# Patient Record
Sex: Female | Born: 1945 | Race: White | Hispanic: No | Marital: Married | Smoking: Never smoker
Health system: Southern US, Community
[De-identification: ages and names within clinical notes are randomized; demographics above are authoritative.]

## PROBLEM LIST (undated history)

## (undated) DIAGNOSIS — K219 Gastro-esophageal reflux disease without esophagitis: Secondary | ICD-10-CM

## (undated) DIAGNOSIS — Z860100 Personal history of colon polyps, unspecified: Secondary | ICD-10-CM

## (undated) DIAGNOSIS — Q249 Congenital malformation of heart, unspecified: Secondary | ICD-10-CM

## (undated) DIAGNOSIS — M1990 Unspecified osteoarthritis, unspecified site: Secondary | ICD-10-CM

## (undated) DIAGNOSIS — M7500 Adhesive capsulitis of unspecified shoulder: Secondary | ICD-10-CM

## (undated) DIAGNOSIS — E785 Hyperlipidemia, unspecified: Secondary | ICD-10-CM

## (undated) DIAGNOSIS — I10 Essential (primary) hypertension: Secondary | ICD-10-CM

## (undated) DIAGNOSIS — E119 Type 2 diabetes mellitus without complications: Secondary | ICD-10-CM

## (undated) DIAGNOSIS — E663 Overweight: Secondary | ICD-10-CM

## (undated) DIAGNOSIS — Z96652 Presence of left artificial knee joint: Secondary | ICD-10-CM

## (undated) DIAGNOSIS — Z96612 Presence of left artificial shoulder joint: Secondary | ICD-10-CM

## (undated) DIAGNOSIS — K5790 Diverticulosis of intestine, part unspecified, without perforation or abscess without bleeding: Secondary | ICD-10-CM

## (undated) DIAGNOSIS — I480 Paroxysmal atrial fibrillation: Secondary | ICD-10-CM

## (undated) HISTORY — DX: Overweight: E66.3

## (undated) HISTORY — PX: FOOT SURGERY: SHX648

## (undated) HISTORY — DX: Congenital malformation of heart, unspecified: Q24.9

## (undated) HISTORY — PX: COLONOSCOPY: SHX174

## (undated) HISTORY — DX: Unspecified osteoarthritis, unspecified site: M19.90

## (undated) HISTORY — DX: Hyperlipidemia, unspecified: E78.5

## (undated) HISTORY — PX: LIGATION / DIVISION SAPHENOUS VEIN: SUR828

## (undated) HISTORY — PX: ABLATION: SHX5711

## (undated) HISTORY — DX: Diverticulosis of intestine, part unspecified, without perforation or abscess without bleeding: K57.90

## (undated) HISTORY — PX: CATARACT EXTRACTION: SUR2

## (undated) HISTORY — DX: Personal history of colonic polyps: Z86.010

## (undated) HISTORY — PX: POLYPECTOMY: SHX149

## (undated) HISTORY — DX: Personal history of colon polyps, unspecified: Z86.0100

## (undated) HISTORY — DX: Gastro-esophageal reflux disease without esophagitis: K21.9

## (undated) HISTORY — DX: Adhesive capsulitis of unspecified shoulder: M75.00

## (undated) HISTORY — DX: Paroxysmal atrial fibrillation: I48.0

## (undated) HISTORY — PX: DILATION AND CURETTAGE OF UTERUS: SHX78

## (undated) HISTORY — PX: EYE SURGERY: SHX253

## (undated) HISTORY — PX: CYSTECTOMY: SUR359

---
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
## 2000-09-07 DIAGNOSIS — I480 Paroxysmal atrial fibrillation: Secondary | ICD-10-CM

## 2000-09-07 HISTORY — DX: Paroxysmal atrial fibrillation: I48.0

## 2013-01-19 HISTORY — PX: OTHER SURGICAL HISTORY: SHX169

## 2014-05-10 HISTORY — PX: KNEE SURGERY: SHX244

## 2015-06-25 ENCOUNTER — Encounter: Payer: Self-pay | Admitting: Internal Medicine

## 2015-07-17 ENCOUNTER — Encounter: Payer: Self-pay | Admitting: Internal Medicine

## 2015-08-08 LAB — HM MAMMOGRAPHY

## 2015-10-13 DIAGNOSIS — M19012 Primary osteoarthritis, left shoulder: Secondary | ICD-10-CM | POA: Insufficient documentation

## 2015-10-16 DIAGNOSIS — M7501 Adhesive capsulitis of right shoulder: Secondary | ICD-10-CM | POA: Diagnosis not present

## 2015-10-16 DIAGNOSIS — M67911 Unspecified disorder of synovium and tendon, right shoulder: Secondary | ICD-10-CM | POA: Diagnosis not present

## 2015-10-22 DIAGNOSIS — M25612 Stiffness of left shoulder, not elsewhere classified: Secondary | ICD-10-CM | POA: Diagnosis not present

## 2015-10-22 DIAGNOSIS — M7502 Adhesive capsulitis of left shoulder: Secondary | ICD-10-CM | POA: Diagnosis not present

## 2015-10-22 DIAGNOSIS — M25512 Pain in left shoulder: Secondary | ICD-10-CM | POA: Diagnosis not present

## 2015-11-03 ENCOUNTER — Telehealth: Payer: Self-pay | Admitting: Internal Medicine

## 2015-11-03 ENCOUNTER — Encounter: Payer: Self-pay | Admitting: Internal Medicine

## 2015-11-03 NOTE — Telephone Encounter (Signed)
Spoke with patient and let her know we do not mail or email new patient forms any longer.  She will have one sheet to complete day of appointment

## 2015-11-03 NOTE — Telephone Encounter (Signed)
New Message  Pt call stating she was to receive via mail NEW PT forms to fill out. Pt wanted to know if they were sent. Pt states she would like to go ahead and get the paperwork filled out before coming into the office.. Please call back to discuss if needed

## 2015-11-10 DIAGNOSIS — M25512 Pain in left shoulder: Secondary | ICD-10-CM | POA: Diagnosis not present

## 2015-11-10 DIAGNOSIS — M25612 Stiffness of left shoulder, not elsewhere classified: Secondary | ICD-10-CM | POA: Diagnosis not present

## 2015-11-10 DIAGNOSIS — M7502 Adhesive capsulitis of left shoulder: Secondary | ICD-10-CM | POA: Diagnosis not present

## 2015-11-12 DIAGNOSIS — M7502 Adhesive capsulitis of left shoulder: Secondary | ICD-10-CM | POA: Diagnosis not present

## 2015-11-12 DIAGNOSIS — M25612 Stiffness of left shoulder, not elsewhere classified: Secondary | ICD-10-CM | POA: Diagnosis not present

## 2015-11-12 DIAGNOSIS — M25512 Pain in left shoulder: Secondary | ICD-10-CM | POA: Diagnosis not present

## 2015-11-13 DIAGNOSIS — M7502 Adhesive capsulitis of left shoulder: Secondary | ICD-10-CM | POA: Diagnosis not present

## 2015-11-17 ENCOUNTER — Encounter: Payer: Self-pay | Admitting: Internal Medicine

## 2015-11-17 ENCOUNTER — Ambulatory Visit (INDEPENDENT_AMBULATORY_CARE_PROVIDER_SITE_OTHER): Payer: Medicare Other | Admitting: Internal Medicine

## 2015-11-17 VITALS — BP 130/88 | HR 106 | Ht 67.0 in | Wt 215.0 lb

## 2015-11-17 DIAGNOSIS — I4891 Unspecified atrial fibrillation: Secondary | ICD-10-CM | POA: Insufficient documentation

## 2015-11-17 DIAGNOSIS — I480 Paroxysmal atrial fibrillation: Secondary | ICD-10-CM

## 2015-11-17 DIAGNOSIS — I4819 Other persistent atrial fibrillation: Secondary | ICD-10-CM

## 2015-11-17 DIAGNOSIS — I481 Persistent atrial fibrillation: Secondary | ICD-10-CM | POA: Diagnosis not present

## 2015-11-17 NOTE — Progress Notes (Signed)
Electrophysiology Office Note   Date:  11/17/2015   ID:  Kelsey Weaver, [name] April 24, 1946, MRN [id number]  PCP:  Sheral Flow, NP   Primary Electrophysiologist: Thompson Grayer, MD    Chief Complaint  Patient presents with  . Atrial Fibrillation     History of Present Illness: Kelsey Weaver is a 70 y.o. female who presents today for electrophysiology evaluation.   She recently moved to [place] to be near her son after her retirement.  She has an extensive afib history dating back to 2002.  She has failed medical therapy with flecainide and rhythmol.  She has tried atenolol previously.  She underwent PVI in [place] 10/31/2007.  She has repeat ablation 09/05/2008.  She did well until 2014.  She had did have some breakthrough events.  She had worsening afib in 2014 and underwent repeat ablation 09/22/12.  The PVs were quiescent.  He did ablation at the septal portion of the RIPV as well as at the SVC/RA junction.  CTI ablation was also performed.  With another recurrence 07/01/2015 she had her 4th ablation.  At that time, the SVT was re-isolated.  A PAC focus was found near the left septum and was ablated.  She has done well since ablation without recurrence.  She is pleased with her current health state.  She had some sinus tach early which has improved.  She is s/p MDT LINQ implant 01/19/13 for afib management.   Today, she denies symptoms of palpitations, chest pain, shortness of breath, orthopnea, PND, lower extremity edema, claudication, dizziness, presyncope, syncope, bleeding, or neurologic sequela. The patient is tolerating medications without difficulties and is otherwise without complaint today.    Past Medical History  Diagnosis Date  . Paroxysmal atrial fibrillation ([place]) 09/2000  . Hyperlipemia   . Overweight   . DJD (degenerative joint disease)     frozen shoulder   Past Surgical History  Procedure Laterality Date  . Dilation and curettage of uterus    . Cystectomy     subsebacous x 2  . Ablation  10/31/2007, 08/26/2008, 09/22/2012, 07/01/2015    AFib ablation x 4 in [place]  . Implantable loop recorder pacement  01/19/13    MDT Reveal LINQ implanted in [place] for afib management  . Ligation / division saphenous vein Bilateral   . Foot surgery  2008, 2014     Current Outpatient Prescriptions  Medication Sig Dispense Refill  . aspirin 81 MG tablet Take 81 mg by mouth daily.    Marland Kitchen atenolol (TENORMIN) 25 MG tablet Take 25 mg by mouth daily as needed (heart rate).     . famotidine (PEPCID AC MAXIMUM STRENGTH) 20 MG tablet Take 20 mg by mouth 2 (two) times daily.    . flecainide (TAMBOCOR) 50 MG tablet Take 50 mg by mouth 2 (two) times daily as needed (AFIB).     . Multiple Vitamin (MULTIVITAMIN) tablet Take 1 tablet by mouth daily.    Marland Kitchen OVER THE COUNTER MEDICATION Take 1 capsule by mouth daily. MegaRed Omega-3 Krill Oil 1,000 mg-230 mg-60 mg    . pravastatin (PRAVACHOL) 20 MG tablet Take 20 mg by mouth daily.    . temazepam (RESTORIL) 15 MG capsule Take 15 mg by mouth at bedtime.    . triamterene-hydrochlorothiazide (MAXZIDE-25) 37.5-25 MG tablet Take 0.5 tablets by mouth daily as needed (Edema).      No current facility-administered medications for this visit.    Allergies:   Ambien; Compazine; Metoprolol; and Reglan  Social History:  The patient  reports that she has never smoked. She does not have any smokeless tobacco history on file. She reports that she drinks alcohol. She reports that she does not use illicit drugs.   Family History:  The patient's  family history includes Atrial fibrillation in her brother and father; CAD in her father; Cholelithiasis in her sister; Hypertension in her mother.    ROS:  Please see the history of present illness.   All other systems are reviewed and negative.    PHYSICAL EXAM: VS:  BP 130/88 mmHg  Pulse 106  Ht 5\' 7"  (1.702 m)  Wt 215 lb (97.523 kg)  BMI 33.67 kg/m2  SpO2 98% , BMI Body mass index is 33.67  kg/(m^2). GEN: Well nourished, well developed, in no acute distress HEENT: normal Neck: no JVD, carotid bruits, or masses Cardiac: RRR; no murmurs, rubs, or gallops,no edema  Respiratory:  clear to auscultation bilaterally, normal work of breathing GI: soft, nontender, nondistended, + BS MS: no deformity or atrophy Skin: warm and dry  Neuro:  Strength and sensation are intact Psych: euthymic mood, full affect  EKG:  EKG is ordered today. The ekg ordered today shows sinus tachycardia 106 bpm, otherwise normal ekg   Wt Readings from Last 3 Encounters:  11/17/15 215 lb (97.523 kg)      Other studies Reviewed: Additional studies/ records that were reviewed today include: records from [facility] of the above records today demonstrates: as above   ASSESSMENT AND PLAN:  1.  Paroxysmal atrial fibrillation Doing well at this time chads2vasc score is 2 (age, female).  She is on asa rather than [place] therapy She does not wish to make changes at this time.  ILR interrogated today and reveals no afib  Follow-up:  Return to see me in 6 months.  She declines remote monitoring of her ILR  Current medicines are reviewed at length with the patient today.   The patient does not have concerns regarding her medicines.  The following changes were made today:  none   Signed, Thompson Grayer, MD  11/17/2015 3:18 PM     [facility] [address] [place] [address] [phone] (office) [phone] (fax)\

## 2015-11-17 NOTE — Patient Instructions (Signed)
Your physician recommends that you continue on your current medications as directed. Please refer to the Current Medication list given to you today.  Your physician wants you to follow-up in: 6 months with Dr. Allred.  You will receive a reminder letter in the mail two months in advance. If you don't receive a letter, please call our office to schedule the follow-up appointment.  

## 2015-11-20 DIAGNOSIS — M25512 Pain in left shoulder: Secondary | ICD-10-CM | POA: Diagnosis not present

## 2015-11-20 DIAGNOSIS — M25612 Stiffness of left shoulder, not elsewhere classified: Secondary | ICD-10-CM | POA: Diagnosis not present

## 2015-11-20 DIAGNOSIS — M7502 Adhesive capsulitis of left shoulder: Secondary | ICD-10-CM | POA: Diagnosis not present

## 2015-11-25 DIAGNOSIS — M25512 Pain in left shoulder: Secondary | ICD-10-CM | POA: Diagnosis not present

## 2015-11-25 DIAGNOSIS — M7502 Adhesive capsulitis of left shoulder: Secondary | ICD-10-CM | POA: Diagnosis not present

## 2015-11-25 DIAGNOSIS — M25612 Stiffness of left shoulder, not elsewhere classified: Secondary | ICD-10-CM | POA: Diagnosis not present

## 2015-12-02 DIAGNOSIS — M25612 Stiffness of left shoulder, not elsewhere classified: Secondary | ICD-10-CM | POA: Diagnosis not present

## 2015-12-02 DIAGNOSIS — M25512 Pain in left shoulder: Secondary | ICD-10-CM | POA: Diagnosis not present

## 2015-12-02 DIAGNOSIS — M7502 Adhesive capsulitis of left shoulder: Secondary | ICD-10-CM | POA: Diagnosis not present

## 2015-12-03 ENCOUNTER — Ambulatory Visit: Payer: Self-pay | Admitting: Primary Care

## 2015-12-09 ENCOUNTER — Telehealth: Payer: Self-pay

## 2015-12-09 NOTE — Telephone Encounter (Signed)
Per pt:  Her device is not working called [employer]   She is not enrolled with care link in [place]   She was told to speak to An[place] by Sharee Pimple  Please give her a call.

## 2015-12-09 NOTE — Telephone Encounter (Signed)
Spoke w/ pt and informed her that I would call cardiovascular group and have pt transferred in carelink. Pt verbalized understanding  LMOVM for device clinic to return my call.

## 2015-12-12 NOTE — Telephone Encounter (Signed)
Pt will be transferred in carelink today.

## 2015-12-15 LAB — CUP PACEART INCLINIC DEVICE CHECK: Date Time Interrogation Session: 20170710183924

## 2015-12-16 DIAGNOSIS — M5412 Radiculopathy, cervical region: Secondary | ICD-10-CM | POA: Diagnosis not present

## 2015-12-17 ENCOUNTER — Ambulatory Visit: Payer: Medicare Other | Admitting: Primary Care

## 2015-12-30 ENCOUNTER — Encounter: Payer: Self-pay | Admitting: Primary Care

## 2015-12-30 ENCOUNTER — Encounter: Payer: Self-pay | Admitting: Radiology

## 2015-12-30 ENCOUNTER — Ambulatory Visit (INDEPENDENT_AMBULATORY_CARE_PROVIDER_SITE_OTHER): Payer: Medicare Other | Admitting: Primary Care

## 2015-12-30 VITALS — BP 124/72 | HR 86 | Temp 98.3°F | Ht 67.0 in | Wt 225.4 lb

## 2015-12-30 DIAGNOSIS — E119 Type 2 diabetes mellitus without complications: Secondary | ICD-10-CM | POA: Insufficient documentation

## 2015-12-30 DIAGNOSIS — R7303 Prediabetes: Secondary | ICD-10-CM | POA: Diagnosis not present

## 2015-12-30 DIAGNOSIS — E785 Hyperlipidemia, unspecified: Secondary | ICD-10-CM | POA: Insufficient documentation

## 2015-12-30 DIAGNOSIS — E1165 Type 2 diabetes mellitus with hyperglycemia: Secondary | ICD-10-CM | POA: Insufficient documentation

## 2015-12-30 DIAGNOSIS — Z79899 Other long term (current) drug therapy: Secondary | ICD-10-CM | POA: Diagnosis not present

## 2015-12-30 DIAGNOSIS — R609 Edema, unspecified: Secondary | ICD-10-CM

## 2015-12-30 DIAGNOSIS — I480 Paroxysmal atrial fibrillation: Secondary | ICD-10-CM | POA: Diagnosis not present

## 2015-12-30 DIAGNOSIS — R600 Localized edema: Secondary | ICD-10-CM | POA: Insufficient documentation

## 2015-12-30 DIAGNOSIS — G4700 Insomnia, unspecified: Secondary | ICD-10-CM

## 2015-12-30 MED ORDER — TEMAZEPAM 30 MG PO CAPS: 30.0000 mg | ORAL_CAPSULE | Freq: Every evening | ORAL | 0 refills | Status: DC | PRN

## 2015-12-30 NOTE — Progress Notes (Signed)
Subjective:    Patient ID: Kelsey Weaver, female    DOB: 10/11/1945, 70 y.o.   MRN: [id number]  HPI  Kelsey Weaver is a 70 year old female who presents today to establish care and discuss the problems mentioned below. Will obtain old records. Her last physical was in March 2017 from her prior PCP in [place].   1) Atrial Fibrillation: Currently managed on Flecainide 50 mg, aspirin 81 mg, and atenolol 25 mg. History of four ablation procedures, last being in February 2017. Has implanted ILR which was interrogated in July 2017 and revealed no atrial fibrillation. Follows with Dr. Rayann Heman.  2) Edema: Currently managed on triamterene-HCTZ 37.5-25 mg and takes 1/2 tablet PRN. BMP from March 2017 unremarkable.   3) Hyperlipidemia: Currently managed on Pravastatin 20 mg. Her last lipid panel was in December 2015. Denies myalgias.   4) Insomnia: Currently managed on Temazepam 15 mg for the past 2-3 years. She was previously managed on Ambien which caused side effects and made her feel like a different person. She is currently waking most nights at 2 am with difficulty falling back asleep. She would like to try the 30 mg strength. She's failed OTC treatment including Melatonin. Denies symptoms of anxiety but is under stress caring for her mother who is elderly with dementia.    5) Left Shoulder Pain: Present since June 2017 after lifting boxes and moving to [place] from [place]. Evaluated at [facility], injected with steroids and provided with a prescription for Mobic. Overall improved since initiation of treatment. She plans to follow up with Kelsey Weaver soon.  6) Prediabetes: Fasting blood sugars of 100-105 on average for years. Previously managed on Metformin 500 mg BID that was initiated in December 2015 which caused nausea, therefore inability to tolerate. Denies numbness/tingling, visual changes, chest pain.  Review of Systems  Constitutional: Negative for fatigue.  Eyes: Negative for  visual disturbance.  Respiratory: Negative for shortness of breath.   Cardiovascular: Negative for chest pain and palpitations.       Intermittent lower extremity edema  Gastrointestinal: Negative for nausea.  Endocrine: Negative for polyuria.  Musculoskeletal: Negative for myalgias.  Allergic/Immunologic: Negative for environmental allergies.  Neurological: Negative for dizziness and numbness.  Psychiatric/Behavioral: Positive for sleep disturbance. The patient is not nervous/anxious.        Past Medical History:  Diagnosis Date  . Cardiac arrhythmia due to congenital heart disease   . DJD (degenerative joint disease)    frozen shoulder  . GERD (gastroesophageal reflux disease)   . Hyperlipemia   . Overweight   . Paroxysmal atrial fibrillation ([place]) 09/2000     Social History   Social History  . Marital status: Married    Spouse name: N/A  . Number of children: N/A  . Years of education: N/A   Occupational History  . Not on file.   Social History Main Topics  . Smoking status: Never Smoker  . Smokeless tobacco: Not on file  . Alcohol use 0.0 oz/week     Comment: rare  . Drug use: No  . Sexual activity: Not on file   Other Topics Concern  . 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    Once worked in L&D and NICU.    Son is a Marine scientist at [employer] and also works for [employer] .   Enjoys spending time with her family.  Past Surgical History:  Procedure Laterality Date  . ABLATION  10/31/2007, 08/26/2008, 09/22/2012, 07/01/2015   [place]ib [place] x 4 in [place]  . CYSTECTOMY     subsebacous x 2  . DILATION AND CURETTAGE OF UTERUS    . FOOT SURGERY  2008, 2014  . implantable loop recorder pacement  01/19/13   MDT Reveal LINQ implanted in [place] for afib management  . KNEE SURGERY Left   . LIGATION / DIVISION SAPHENOUS VEIN Bilateral     Family History  Problem Relation Age of Onset  . Hypertension Mother    . CAD Father   . Atrial fibrillation Father   . Atrial fibrillation Brother   . Cholelithiasis Sister     Allergies  Allergen Reactions  . Ambien [Zolpidem Tartrate] Other (See Comments)    Fatigue, Abdomen pain.  . Compazine [Prochlorperazine Edisylate] Other (See Comments)    Aches and pains, generalized  . Metoprolol Other (See Comments)    Depression   . Reglan [Metoclopramide] Anxiety    Current Outpatient Prescriptions on File Prior to Visit  Medication Sig Dispense Refill  . aspirin 81 MG tablet Take 81 mg by mouth daily.    Marland Kitchen atenolol (TENORMIN) 25 MG tablet Take 25 mg by mouth daily as needed (heart rate).     . famotidine (PEPCID AC MAXIMUM STRENGTH) 20 MG tablet Take 20 mg by mouth 2 (two) times daily.    . flecainide (TAMBOCOR) 50 MG tablet Take 50 mg by mouth 2 (two) times daily as needed (AFIB).     . Multiple Vitamin (MULTIVITAMIN) tablet Take 1 tablet by mouth daily.    Marland Kitchen OVER THE COUNTER MEDICATION Take 1 capsule by mouth daily. MegaRed Omega-3 Krill Oil 1,000 mg-230 mg-60 mg    . pravastatin (PRAVACHOL) 20 MG tablet Take 20 mg by mouth daily.    Marland Kitchen triamterene-hydrochlorothiazide (MAXZIDE-25) 37.5-25 MG tablet Take 0.5 tablets by mouth daily as needed (Edema).      No current facility-administered medications on file prior to visit.     BP 124/72   Pulse 86   Temp 98.3 F (36.8 C) (Oral)   Ht 5\' 7"  (1.702 m)   Wt 225 lb 6.4 oz (102.2 kg)   SpO2 96%   BMI 35.30 kg/m    Objective:   Physical Exam  Constitutional: She is oriented to person, place, and time. She appears well-nourished.  HENT:  Head: Normocephalic.  Neck: Neck supple.  Cardiovascular: Normal rate and regular rhythm.   Mild pedal edema to left foot.  Pulmonary/Chest: Effort normal and breath sounds normal.  Neurological: She is alert and oriented to person, place, and time.  Skin: Skin is warm and dry.  Psychiatric: She has a normal mood and affect.          Assessment & Plan:

## 2015-12-30 NOTE — Assessment & Plan Note (Addendum)
Mostly pedal. History of numerous foot surgeries. Takes Maxide PRN per prior PCP with improvement.  BMP on file from March 2017 stable.

## 2015-12-30 NOTE — Progress Notes (Signed)
Pre visit review using our clinic review tool, if applicable. No additional management support is needed unless otherwise documented below in the visit note. 

## 2015-12-30 NOTE — Patient Instructions (Signed)
Schedule a lab only appointment within the next week at your convenience. I will notify you of your lab results once received.  Try Temazepam 30 mg tablets for insomnia. Take 1 tablet by mouth at bedtime as needed for sleep. Please call/e-mail me if no improvement within 2 weeks.  It was a pleasure to meet you today! Please don't hesitate to call me with any questions. Welcome to [facility]!

## 2015-12-30 NOTE — Assessment & Plan Note (Signed)
Managed on pravastatin 20 mg. No recent lipid check per patient. Will obtain within the next week with LFT's.

## 2015-12-30 NOTE — Assessment & Plan Note (Signed)
Long history of. Failed OTC treatment and Ambien. Currently managed on Temazepam 15 mg, waking up at 2 am. Will increase dose to 30 mg and have her call in 2 weeks with an update. UDS and controlled substance contract obtained today.

## 2015-12-30 NOTE — Assessment & Plan Note (Signed)
Endorses fasting BS of 105 on average. Once managed on Metformin but could not tolerate. Will check A1C on upcoming labs. Asymptomatic.

## 2015-12-30 NOTE — Assessment & Plan Note (Signed)
Following with Dr. Rayann Heman. Recent interrogation unremarkable. Managed on Flecainide and Atenolol. Rate and rhythm regular.

## 2016-01-09 ENCOUNTER — Telehealth: Payer: Self-pay | Admitting: *Deleted

## 2016-01-09 NOTE — Telephone Encounter (Signed)
Patient called to check on the status of her Carelink home monitor.  Advised patient that I will speak with tech services and call her back.  Patient is agreeable and appreciative.  Per Gregary Signs at [employer], new monitor was shipped to patient's old address and delivered last Friday.  Will contact patient to make her aware.  Patient made aware that new Carelink monitor should arrive in 7-10 business days.  She also wants it to be noted that she has been taking flecainide and atenolol daily until her home monitor arrives.  She is appreciative and denies additional questions or concerns at this time.

## 2016-01-23 ENCOUNTER — Telehealth: Payer: Self-pay | Admitting: Cardiology

## 2016-01-23 NOTE — Telephone Encounter (Signed)
Called pt to see if she had received her new home monitor. Pt stated that she had received it but had not hooked it up yet. Pt gave me serial number to the monitor so I could ensure that information was correct in her carelink profile, and it was correct. Pt will hook up the monitor when she gets back from taking her mother home.

## 2016-01-26 ENCOUNTER — Other Ambulatory Visit: Payer: Self-pay | Admitting: Primary Care

## 2016-01-26 DIAGNOSIS — G4700 Insomnia, unspecified: Secondary | ICD-10-CM

## 2016-01-27 NOTE — Telephone Encounter (Signed)
Ok to refill? Electronically refill request for   temazepam (RESTORIL) 30 MG capsule  Last prescribed and seen on 12/30/2015.

## 2016-01-29 ENCOUNTER — Other Ambulatory Visit: Payer: Self-pay | Admitting: Primary Care

## 2016-01-29 DIAGNOSIS — G4700 Insomnia, unspecified: Secondary | ICD-10-CM

## 2016-01-29 MED ORDER — TEMAZEPAM 30 MG PO CAPS: 30.0000 mg | ORAL_CAPSULE | Freq: Every evening | ORAL | 0 refills | Status: DC | PRN

## 2016-01-29 NOTE — Telephone Encounter (Signed)
Spoken to patient and she stated that the increased to 30 mg has really made a difference for patient. She is doing well.  Called in medication to the pharmacy as instructed.

## 2016-01-29 NOTE — Telephone Encounter (Signed)
Ok to refill? Electronically refill request for   temazepam (RESTORIL) 30 MG capsule  Last prescribed on 12/30/2015.

## 2016-02-05 ENCOUNTER — Encounter: Payer: Self-pay | Admitting: Primary Care

## 2016-02-06 ENCOUNTER — Telehealth: Payer: Self-pay | Admitting: Cardiology

## 2016-02-06 NOTE — Telephone Encounter (Signed)
Spoke w/ pt and requested that she send a manual transmission b/c her home monitor has not updated in at least 14 days.   

## 2016-02-17 DIAGNOSIS — Z23 Encounter for immunization: Secondary | ICD-10-CM | POA: Diagnosis not present

## 2016-02-18 ENCOUNTER — Telehealth: Payer: Self-pay | Admitting: *Deleted

## 2016-02-18 ENCOUNTER — Encounter: Payer: Self-pay | Admitting: Primary Care

## 2016-02-18 ENCOUNTER — Other Ambulatory Visit: Payer: Self-pay | Admitting: Primary Care

## 2016-02-18 DIAGNOSIS — Z1159 Encounter for screening for other viral diseases: Secondary | ICD-10-CM

## 2016-02-18 DIAGNOSIS — Z1211 Encounter for screening for malignant neoplasm of colon: Secondary | ICD-10-CM

## 2016-02-18 NOTE — Telephone Encounter (Signed)
From: [name]   Sent: 02/18/2016 12:48 PM EDT    To: Patient HM Schedule Request Mailing List  Subject: Appointment Request (HM)    Appointment Request From: [name]    With Provider: Sheral Flow, NP [facility] [place]    Preferred Date Range: Any date 02/18/2016 or later    Preferred Times: Any    Reason: To address the following health maintenance concerns.  Hepatitis C Screening  Colonoscopy  Dexa Scan  Influenza Vaccine    Comments:  HI, I just need to get ordered labs done. CMP, A1C, Lipids  Do I need an appointment to come in for that?   Or can I just show up fasting?  Thanks!   I received this message from the PT. Please advise.

## 2016-02-19 NOTE — Telephone Encounter (Signed)
Noted and addressed patient via My Chart.

## 2016-02-23 ENCOUNTER — Ambulatory Visit (INDEPENDENT_AMBULATORY_CARE_PROVIDER_SITE_OTHER): Payer: Medicare Other | Admitting: *Deleted

## 2016-02-23 DIAGNOSIS — I480 Paroxysmal atrial fibrillation: Secondary | ICD-10-CM | POA: Diagnosis not present

## 2016-02-23 NOTE — Progress Notes (Signed)
Carelink Summary Report / Loop Recorder 

## 2016-03-03 ENCOUNTER — Telehealth: Payer: Self-pay | Admitting: Cardiology

## 2016-03-03 NOTE — Telephone Encounter (Signed)
Spoke w/ pt and requested that she send a manual transmission b/c her home monitor has not updated in at least 14 days.   

## 2016-03-04 ENCOUNTER — Other Ambulatory Visit (INDEPENDENT_AMBULATORY_CARE_PROVIDER_SITE_OTHER): Payer: Medicare Other

## 2016-03-04 DIAGNOSIS — E7849 Other hyperlipidemia: Secondary | ICD-10-CM

## 2016-03-04 DIAGNOSIS — R7303 Prediabetes: Secondary | ICD-10-CM | POA: Diagnosis not present

## 2016-03-04 DIAGNOSIS — Z1159 Encounter for screening for other viral diseases: Secondary | ICD-10-CM

## 2016-03-04 DIAGNOSIS — E784 Other hyperlipidemia: Secondary | ICD-10-CM | POA: Diagnosis not present

## 2016-03-04 DIAGNOSIS — E785 Hyperlipidemia, unspecified: Secondary | ICD-10-CM

## 2016-03-04 LAB — LIPID PANEL
Cholesterol: 138 mg/dL (ref 0–200)
HDL: 40.5 mg/dL (ref >39.00)
LDL Cholesterol: 65 mg/dL (ref 0–99)
NonHDL: 97.54
Total CHOL/HDL Ratio: 3
Triglycerides: 163 mg/dL — ABNORMAL HIGH (ref 0.0–149.0)
VLDL: 32.6 mg/dL (ref 0.0–40.0)

## 2016-03-04 LAB — COMPREHENSIVE METABOLIC PANEL
ALT: 28 U/L (ref 0–35)
AST: 26 U/L (ref 0–37)
Albumin: 4.1 g/dL (ref 3.5–5.2)
Alkaline Phosphatase: 52 U/L (ref 39–117)
BUN: 28 mg/dL — ABNORMAL HIGH (ref 6–23)
CO2: 29 mEq/L (ref 19–32)
Calcium: 9.3 mg/dL (ref 8.4–10.5)
Chloride: 103 mEq/L (ref 96–112)
Creatinine, Ser: 0.84 mg/dL (ref 0.40–1.20)
GFR: 71.24 mL/min (ref >60.00)
Glucose, Bld: 146 mg/dL — ABNORMAL HIGH (ref 70–99)
Potassium: 4.4 mEq/L (ref 3.5–5.1)
Sodium: 140 mEq/L (ref 135–145)
Total Bilirubin: 0.5 mg/dL (ref 0.2–1.2)
Total Protein: 6.8 g/dL (ref 6.0–8.3)

## 2016-03-04 LAB — HEMOGLOBIN A1C: Hgb A1c MFr Bld: 6.9 % — ABNORMAL HIGH (ref 4.6–6.5)

## 2016-03-05 ENCOUNTER — Encounter: Payer: Self-pay | Admitting: Primary Care

## 2016-03-05 ENCOUNTER — Other Ambulatory Visit: Payer: Self-pay | Admitting: Primary Care

## 2016-03-05 DIAGNOSIS — G4700 Insomnia, unspecified: Secondary | ICD-10-CM

## 2016-03-05 LAB — HEPATITIS C ANTIBODY: HCV Ab: NEGATIVE

## 2016-03-08 MED ORDER — TEMAZEPAM 30 MG PO CAPS: 30.0000 mg | ORAL_CAPSULE | Freq: Every evening | ORAL | 0 refills | Status: DC | PRN

## 2016-03-08 NOTE — Telephone Encounter (Signed)
Called in medication to the pharmacy as instructed.  Also UDS is updated and next screening is 07/01/2016.

## 2016-03-11 ENCOUNTER — Encounter: Payer: Self-pay | Admitting: Primary Care

## 2016-03-11 ENCOUNTER — Other Ambulatory Visit: Payer: Self-pay | Admitting: Primary Care

## 2016-03-11 ENCOUNTER — Encounter: Payer: Self-pay | Admitting: Gastroenterology

## 2016-03-11 DIAGNOSIS — E785 Hyperlipidemia, unspecified: Secondary | ICD-10-CM

## 2016-03-11 MED ORDER — PRAVASTATIN SODIUM 20 MG PO TABS: 20.0000 mg | ORAL_TABLET | Freq: Every day | ORAL | 3 refills | Status: DC

## 2016-03-12 ENCOUNTER — Other Ambulatory Visit: Payer: Self-pay | Admitting: Primary Care

## 2016-03-12 DIAGNOSIS — E2839 Other primary ovarian failure: Secondary | ICD-10-CM

## 2016-03-23 ENCOUNTER — Ambulatory Visit (INDEPENDENT_AMBULATORY_CARE_PROVIDER_SITE_OTHER): Payer: Medicare Other | Admitting: *Deleted

## 2016-03-23 ENCOUNTER — Encounter: Payer: Self-pay | Admitting: Cardiology

## 2016-03-23 DIAGNOSIS — I480 Paroxysmal atrial fibrillation: Secondary | ICD-10-CM | POA: Diagnosis not present

## 2016-03-24 NOTE — Progress Notes (Signed)
Carelink Summary Report / Loop Recorder 

## 2016-03-27 LAB — CUP PACEART REMOTE DEVICE CHECK
Date Time Interrogation Session: 20171015204152
Implantable Pulse Generator Implant Date: 20140901

## 2016-03-27 NOTE — Progress Notes (Signed)
Carelink summary report received. Battery status OK. Normal device function. No new symptom episodes, tachy episodes, brady, or pause episodes. No new AF episodes. All episodes recorded are prior to 06/2015.  Monthly summary reports and ROV/PRN

## 2016-04-22 ENCOUNTER — Ambulatory Visit (INDEPENDENT_AMBULATORY_CARE_PROVIDER_SITE_OTHER): Payer: Medicare Other | Admitting: *Deleted

## 2016-04-22 DIAGNOSIS — I480 Paroxysmal atrial fibrillation: Secondary | ICD-10-CM | POA: Diagnosis not present

## 2016-04-23 NOTE — Progress Notes (Signed)
Carelink Summary Report / Loop Recorder 

## 2016-04-28 ENCOUNTER — Encounter: Payer: Self-pay | Admitting: *Deleted

## 2016-05-06 LAB — CUP PACEART REMOTE DEVICE CHECK
Date Time Interrogation Session: 20171114213728
Implantable Pulse Generator Implant Date: 20140901

## 2016-05-17 ENCOUNTER — Ambulatory Visit
Admission: RE | Admit: 2016-05-17 | Discharge: 2016-05-17 | Disposition: A | Discharge status: Home / Self Care | Payer: Medicare Other | Source: Ambulatory Visit | Attending: Primary Care | Admitting: Primary Care

## 2016-05-17 DIAGNOSIS — Z780 Asymptomatic menopausal state: Secondary | ICD-10-CM | POA: Diagnosis not present

## 2016-05-17 DIAGNOSIS — E2839 Other primary ovarian failure: Secondary | ICD-10-CM | POA: Diagnosis not present

## 2016-05-17 DIAGNOSIS — Z13820 Encounter for screening for osteoporosis: Secondary | ICD-10-CM | POA: Diagnosis not present

## 2016-05-19 ENCOUNTER — Encounter: Payer: Medicare Other | Admitting: Internal Medicine

## 2016-05-24 ENCOUNTER — Ambulatory Visit (INDEPENDENT_AMBULATORY_CARE_PROVIDER_SITE_OTHER): Payer: Medicare Other | Admitting: *Deleted

## 2016-05-24 DIAGNOSIS — I480 Paroxysmal atrial fibrillation: Secondary | ICD-10-CM | POA: Diagnosis not present

## 2016-05-24 NOTE — Progress Notes (Signed)
Carelink Summary Report / Loop Recorder 

## 2016-05-25 ENCOUNTER — Encounter: Payer: Self-pay | Admitting: Gastroenterology

## 2016-05-25 ENCOUNTER — Ambulatory Visit (INDEPENDENT_AMBULATORY_CARE_PROVIDER_SITE_OTHER): Payer: Medicare Other | Admitting: Gastroenterology

## 2016-05-25 VITALS — BP 130/78 | HR 80 | Ht 67.0 in | Wt 228.6 lb

## 2016-05-25 DIAGNOSIS — M7502 Adhesive capsulitis of left shoulder: Secondary | ICD-10-CM | POA: Diagnosis not present

## 2016-05-25 DIAGNOSIS — Z86010 Personal history of colonic polyps: Secondary | ICD-10-CM

## 2016-05-25 DIAGNOSIS — M67911 Unspecified disorder of synovium and tendon, right shoulder: Secondary | ICD-10-CM | POA: Diagnosis not present

## 2016-05-25 DIAGNOSIS — M19012 Primary osteoarthritis, left shoulder: Secondary | ICD-10-CM | POA: Diagnosis not present

## 2016-05-25 DIAGNOSIS — M7062 Trochanteric bursitis, left hip: Secondary | ICD-10-CM | POA: Diagnosis not present

## 2016-05-25 DIAGNOSIS — M24812 Other specific joint derangements of left shoulder, not elsewhere classified: Secondary | ICD-10-CM | POA: Diagnosis not present

## 2016-05-25 NOTE — Patient Instructions (Signed)
You will be set up for a colonoscopy for polyp surveillance 

## 2016-05-25 NOTE — Progress Notes (Signed)
HPI: This is a very pleasant 71 year old woman  who was referred to me by Pleas Koch, NP  to evaluate  personal history of colon polyps .    Chief complaint is personal history of colon polyps  She had a colonoscopy in 2012 and for routine screening. 3 subcentimeter adenomas were removed. She had a repeat colonoscopy October 2014 and this showed a 12 mm ascending colon polyp that was also adenomatous. Diverticulosis was confirmed.  She was recommended to have repeat colonoscopy 3 years from the date of her last one which is appropriate with current national polyp surveillance guidelines.  She has no overt GI bleeding. She has no troubles with her bowels either constipation or diarrhea. She has no significant abdominal pains.  She has had three afib ablations (in [place]), most recently 2014.  She has an implanted loop recorder in her chest  She sees Dr. Rayann Heman from [place].  She is only 81 asa.  Previously on stronger blood thinner until about year ago, no longer.   No colon cancer in family.  She is under stress with demented mother.   Review of systems: Pertinent positive and negative review of systems were noted in the above HPI section. Complete review of systems was performed and was otherwise normal.   Past Medical History:  Diagnosis Date  . Cardiac arrhythmia due to congenital heart disease   . Diverticulosis   . DJD (degenerative joint disease)    frozen shoulder  . GERD (gastroesophageal reflux disease)   . History of colonic polyps   . Hyperlipemia   . Overweight   . Paroxysmal atrial fibrillation ([place]) 09/2000    Past Surgical History:  Procedure Laterality Date  . ABLATION  10/31/2007, 08/26/2008, 09/22/2012, 07/01/2015   AFib ablation x 4 in [place]  . CYSTECTOMY     subsebacous x 2  . DILATION AND CURETTAGE OF UTERUS    . FOOT SURGERY  2008, 2014  . implantable loop recorder pacement  01/19/13   MDT Reveal LINQ implanted in [place] for afib management  . KNEE  SURGERY Left   . LIGATION / DIVISION SAPHENOUS VEIN Bilateral     Current Outpatient Prescriptions  Medication Sig Dispense Refill  . ALPRAZolam (XANAX) 0.5 MG tablet Take 0.5 mg by mouth at bedtime as needed for anxiety.    Marland Kitchen aspirin 81 MG tablet Take 81 mg by mouth daily.    Marland Kitchen atenolol (TENORMIN) 25 MG tablet Take 25 mg by mouth daily as needed (heart rate).     . famotidine (PEPCID AC MAXIMUM STRENGTH) 20 MG tablet Take 20 mg by mouth 2 (two) times daily.    . flecainide (TAMBOCOR) 50 MG tablet Take 50 mg by mouth 2 (two) times daily as needed (AFIB).     . Multiple Vitamin (MULTIVITAMIN) tablet Take 1 tablet by mouth daily.    Marland Kitchen OVER THE COUNTER MEDICATION Take 1 capsule by mouth daily. MegaRed Omega-3 Krill Oil 1,000 mg-230 mg-60 mg    . pravastatin (PRAVACHOL) 20 MG tablet Take 1 tablet (20 mg total) by mouth daily. 90 tablet 3  . temazepam (RESTORIL) 30 MG capsule Take 1 capsule (30 mg total) by mouth at bedtime as needed for sleep. 90 capsule 0  . triamterene-hydrochlorothiazide (MAXZIDE-25) 37.5-25 MG tablet Take 0.5 tablets by mouth daily as needed (Edema).      No current facility-administered medications for this visit.     Allergies as of 05/25/2016 - Review Complete 05/25/2016  Allergen Reaction Noted  .  Ambien [zolpidem tartrate] Other (See Comments) 11/03/2015  . Compazine [prochlorperazine edisylate] Other (See Comments) 11/03/2015  . Metoprolol Other (See Comments) 11/03/2015  . Reglan [metoclopramide] Anxiety 11/03/2015    Family History  Problem Relation Age of Onset  . Hypertension Mother   . CAD Father   . Atrial fibrillation Father   . Atrial fibrillation Brother   . Cholelithiasis Sister     Social History   Social History  . Marital status: Married    Spouse name: N/A  . Number of children: 3  . Years of education: N/A   Occupational History  . Retired Therapist, sports    Social History Main Topics  . Smoking status: Never Smoker  . Smokeless tobacco:  Never Used  . Alcohol use 0.0 oz/week     Comment: rare  . Drug use: No  . Sexual activity: Not on file   Other Topics Concern  . 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    Once worked in L&D and NICU.    Son is a Marine scientist at [employer] and also works for [employer] .   Enjoys spending time with her family.      Physical Exam: BP 130/78   Pulse 80   Ht 5\' 7"  (1.702 m)   Wt 228 lb 9.6 oz (103.7 kg)   BMI 35.80 kg/m  Constitutional: generally well-appearing Psychiatric: alert and oriented x3 Eyes: extraocular movements intact Mouth: oral pharynx moist, no lesions Neck: supple no lymphadenopathy Cardiovascular: heart regular rate and rhythm Lungs: clear to auscultation bilaterally Abdomen: soft, nontender, nondistended, no obvious ascites, no peritoneal signs, normal bowel sounds Extremities: no lower extremity edema bilaterally Skin: no lesions on visible extremities   Assessment and plan: 71 y.o. female with  personal history of precancerous colon polyps  She has no GI symptoms. She does have an implanted loop recorder but she is under very good control of her atrial fibrillation and seems very stable from a cardiac standpoint. She is not on blood thinners other than 81 mg of aspirin right now. She does not have congestive heart failure. I recommended we proceed with colonoscopy at her soonest convenience for polyp surveillance. I see no reason for any further blood tests or imaging studies prior to that.   Owens Loffler, MD [facility] 05/25/2016, 10:37 AM  Cc: Pleas Koch, NP

## 2016-06-03 ENCOUNTER — Other Ambulatory Visit: Payer: Self-pay | Admitting: Primary Care

## 2016-06-03 DIAGNOSIS — G4700 Insomnia, unspecified: Secondary | ICD-10-CM

## 2016-06-03 MED ORDER — TEMAZEPAM 30 MG PO CAPS: 30.0000 mg | ORAL_CAPSULE | Freq: Every evening | ORAL | 0 refills | Status: DC | PRN

## 2016-06-03 NOTE — Telephone Encounter (Signed)
Ok to refill? Electronically refill request for   temazepam (RESTORIL) 30 MG capsule  Last prescribed on 03/08/2016. Last seen on 05/25/2016.

## 2016-06-03 NOTE — Telephone Encounter (Signed)
Next screening for UDS on 07/01/2016.

## 2016-06-03 NOTE — Telephone Encounter (Signed)
Called in medication to the pharmacy as instructed. 

## 2016-06-05 ENCOUNTER — Other Ambulatory Visit: Payer: Self-pay | Admitting: Primary Care

## 2016-06-05 DIAGNOSIS — G4700 Insomnia, unspecified: Secondary | ICD-10-CM

## 2016-06-06 ENCOUNTER — Encounter: Payer: Self-pay | Admitting: Primary Care

## 2016-06-07 ENCOUNTER — Other Ambulatory Visit: Payer: Self-pay | Admitting: Primary Care

## 2016-06-07 DIAGNOSIS — G4700 Insomnia, unspecified: Secondary | ICD-10-CM

## 2016-06-10 ENCOUNTER — Encounter: Payer: Medicare Other | Admitting: Internal Medicine

## 2016-06-13 LAB — CUP PACEART REMOTE DEVICE CHECK
Date Time Interrogation Session: 20171214213942
Implantable Pulse Generator Implant Date: 20140901

## 2016-06-13 NOTE — Progress Notes (Signed)
Carelink summary report received. Battery status OK. Normal device function. No new symptom episodes, tachy episodes, brady, or pause episodes. No new AF episodes. Monthly summary reports and ROV/PRN 

## 2016-06-14 ENCOUNTER — Ambulatory Visit (INDEPENDENT_AMBULATORY_CARE_PROVIDER_SITE_OTHER): Payer: Medicare Other | Admitting: Internal Medicine

## 2016-06-14 VITALS — BP 122/74 | HR 76 | Ht 67.0 in | Wt 226.6 lb

## 2016-06-14 DIAGNOSIS — I481 Persistent atrial fibrillation: Secondary | ICD-10-CM

## 2016-06-14 DIAGNOSIS — I4819 Other persistent atrial fibrillation: Secondary | ICD-10-CM

## 2016-06-14 LAB — CUP PACEART INCLINIC DEVICE CHECK
Date Time Interrogation Session: 20180205170647
Implantable Pulse Generator Implant Date: 20140901

## 2016-06-14 NOTE — Progress Notes (Signed)
Electrophysiology Office Note   Date:  06/14/2016   ID:  Buford, Cater 1946/03/01, MRN [id number]  PCP:  Sheral Flow, NP   Primary Electrophysiologist: Thompson Grayer, MD    CC: afib   History of Present Illness: Kelsey Weaver is a 71 y.o. female who presents today for electrophysiology evaluation.   She recently moved to [place] to be near her son after her retirement.  She has an extensive afib history dating back to 2002.  She has failed medical therapy with flecainide and rhythmol.  She has tried atenolol previously.  She underwent PVI in [place] 10/31/2007.  She has repeat ablation 09/05/2008.  She did well until 2014.  She had did have some breakthrough events.  She had worsening afib in 2014 and underwent repeat ablation 09/22/12.  The PVs were quiescent.  He did ablation at the septal portion of the RIPV as well as at the SVC/RA junction.  CTI ablation was also performed.  With another recurrence 07/01/2015 she had her 4th ablation.  At that time, the SVT was re-isolated.  A PAC focus was found near the left septum and was ablated.  She has done well since ablation without recurrence.  She established with me 7/17.  She has had no afib since that time.   She is s/p MDT LINQ implant 01/19/13 for afib management.  She is caring for her mother with dementia who is 28.  She has decided to go back on flecainide and atenolol prophylactically to prevent afib.  Today, she denies symptoms of palpitations, chest pain, shortness of breath, orthopnea, PND, lower extremity edema, claudication, dizziness, presyncope, syncope, bleeding, or neurologic sequela. The patient is tolerating medications without difficulties and is otherwise without complaint today.    Past Medical History:  Diagnosis Date  . Cardiac arrhythmia due to congenital heart disease   . Diverticulosis   . DJD (degenerative joint disease)    frozen shoulder  . GERD (gastroesophageal reflux disease)   . History of colonic  polyps   . Hyperlipemia   . Overweight   . Paroxysmal atrial fibrillation ([place]) 09/2000   Past Surgical History:  Procedure Laterality Date  . ABLATION  10/31/2007, 08/26/2008, 09/22/2012, 07/01/2015   AFib ablation x 4 in [place]  . CYSTECTOMY     subsebacous x 2  . DILATION AND CURETTAGE OF UTERUS    . FOOT SURGERY  2008, 2014  . implantable loop recorder pacement  01/19/13   MDT Reveal LINQ implanted in [place] for afib management  . KNEE SURGERY Left   . LIGATION / DIVISION SAPHENOUS VEIN Bilateral      Current Outpatient Prescriptions  Medication Sig Dispense Refill  . ALPRAZolam (XANAX) 0.5 MG tablet Take 0.5 mg by mouth at bedtime as needed for anxiety.    Marland Kitchen aspirin 81 MG tablet Take 81 mg by mouth daily.    Marland Kitchen atenolol (TENORMIN) 25 MG tablet Take 25 mg by mouth daily as needed (heart rate).     . famotidine (PEPCID AC MAXIMUM STRENGTH) 20 MG tablet Take 20 mg by mouth 2 (two) times daily.    . flecainide (TAMBOCOR) 50 MG tablet Take 50 mg by mouth 2 (two) times daily as needed (AFIB).     Marland Kitchen ibuprofen (ADVIL,MOTRIN) 800 MG tablet Take 800 mg by mouth daily.    . Multiple Vitamin (MULTIVITAMIN) tablet Take 1 tablet by mouth daily.    Marland Kitchen OVER THE COUNTER MEDICATION Take 1 capsule by mouth daily. MegaRed Omega-3  Krill Oil 1,000 mg-230 mg-60 mg    . pravastatin (PRAVACHOL) 20 MG tablet Take 1 tablet (20 mg total) by mouth daily. 90 tablet 3  . temazepam (RESTORIL) 30 MG capsule Take 1 capsule (30 mg total) by mouth at bedtime as needed for sleep. 90 capsule 0  . traMADol (ULTRAM) 50 MG tablet Take 50 mg by mouth 2 (two) times daily as needed for pain.    Marland Kitchen triamterene-hydrochlorothiazide (MAXZIDE-25) 37.5-25 MG tablet Take 0.5 tablets by mouth daily as needed (Edema).      No current facility-administered medications for this visit.     Allergies:   Ambien [zolpidem tartrate]; Compazine [prochlorperazine edisylate]; Metoprolol; and Reglan [metoclopramide]   Social History:  The patient   reports that she has never smoked. She has never used smokeless tobacco. She reports that she drinks alcohol. She reports that she does not use drugs.   Family History:  The patient's  family history includes Atrial fibrillation in her brother and father; CAD in her father; Cholelithiasis in her sister; Hypertension in her mother.    ROS:  Please see the history of present illness.   All other systems are reviewed and negative.    PHYSICAL EXAM: VS:  BP 122/74   Pulse 76   Ht 5\' 7"  (1.702 m)   Wt 226 lb 9.6 oz (102.8 kg)   BMI 35.49 kg/m  , BMI Body mass index is 35.49 kg/m. GEN: Well nourished, well developed, in no acute distress  HEENT: normal  Neck: no JVD, carotid bruits, or masses Cardiac: RRR; no murmurs, rubs, or gallops,no edema  Respiratory:  clear to auscultation bilaterally, normal work of breathing GI: soft, nontender, nondistended, + BS MS: no deformity or atrophy  Skin: warm and dry  Neuro:  Strength and sensation are intact Psych: euthymic mood, full affect  EKG:  EKG is ordered today. The ekg ordered today shows sinus rhythm 76 bpm, otherwise normal ekg   Wt Readings from Last 3 Encounters:  06/14/16 226 lb 9.6 oz (102.8 kg)  05/25/16 228 lb 9.6 oz (103.7 kg)  12/30/15 225 lb 6.4 oz (102.2 kg)    ILR is reviewed today.  No afib in the past year.  She has reached RRT   ASSESSMENT AND PLAN:  1.  Paroxysmal atrial fibrillation Doing well at this time chads2vasc score is 2 (age, female).  She is on asa rather than [place] therapy She does not wish to make changes at this time.  ILR interrogated today and reveals no afib.  She wishes to remain on flecainide and atenolol.  Given 4 prior ablations, I think this is reasonable.  ILR has reached RRT.  She does not wish to have this replaced currently.  We will follow in the office until no longer functioning.  She also has Investment banker, operational Engineer, manufacturing) for her phone.  I have given her the phone number for our afib  clinic  Follow-up:  Return to see me in 12 months  Current medicines are reviewed at length with the patient today.   The patient does not have concerns regarding her medicines.  The following changes were made today:  none   Signed, Thompson Grayer, MD  06/14/2016 2:45 PM     [facility] [address] [facility] [place] [address] [phone] (office) [phone] (fax)\

## 2016-06-14 NOTE — Patient Instructions (Signed)
Medication Instructions:  Your physician recommends that you continue on your current medications as directed. Please refer to the Current Medication list given to you today.   Labwork: None ordered   Testing/Procedures: None ordered   Follow-Up: Your physician wants you to follow-up in: 12 months with Dr Maurene Capes will receive a reminder letter in the mail two months in advance. If you don't receive a letter, please call our office to schedule the follow-up appointment.   Any Other Special Instructions Will Be Listed Below (If Applicable).     If you need a refill on your cardiac medications before your next appointment, please call your pharmacy.

## 2016-07-02 ENCOUNTER — Telehealth: Payer: Self-pay | Admitting: *Deleted

## 2016-07-02 LAB — CUP PACEART REMOTE DEVICE CHECK
Date Time Interrogation Session: 20180113213926
Implantable Pulse Generator Implant Date: 20140901

## 2016-07-02 NOTE — Telephone Encounter (Signed)
LMOVM requesting call back to the [facility].  LINQ at RRT since 06/09/16, no plans for explant per 06/14/16 OV note from Dr. Allred appointment.  Will confirm patient's home address correct to order [facility] monitor return kit and unenroll from [employer]. 

## 2016-07-13 NOTE — Telephone Encounter (Signed)
Spoke with patient regarding LINQ at RRT.  She is agreeable to receiving a return kit in the mail at her home address as she previously declined explant.  Patient is appreciative of call and denies additional questions or concerns at this time.  Return kit ordered, unenrolled from [facility].

## 2016-07-15 ENCOUNTER — Encounter: Payer: Self-pay | Admitting: Gastroenterology

## 2016-07-22 ENCOUNTER — Encounter: Payer: Self-pay | Admitting: Internal Medicine

## 2016-07-22 ENCOUNTER — Other Ambulatory Visit: Payer: Self-pay | Admitting: *Deleted

## 2016-07-22 MED ORDER — ATENOLOL 25 MG PO TABS: 25.0000 mg | ORAL_TABLET | Freq: Every day | ORAL | 1 refills | Status: DC | PRN

## 2016-07-22 MED ORDER — FLECAINIDE ACETATE 50 MG PO TABS: 50.0000 mg | ORAL_TABLET | Freq: Two times a day (BID) | ORAL | 1 refills | Status: DC | PRN

## 2016-07-27 ENCOUNTER — Other Ambulatory Visit: Payer: Self-pay | Admitting: Internal Medicine

## 2016-07-27 ENCOUNTER — Encounter: Payer: Self-pay | Admitting: Internal Medicine

## 2016-07-27 MED ORDER — ATENOLOL 25 MG PO TABS: 25.0000 mg | ORAL_TABLET | Freq: Every day | ORAL | 10 refills | Status: DC | PRN

## 2016-07-27 MED ORDER — FLECAINIDE ACETATE 50 MG PO TABS: 50.0000 mg | ORAL_TABLET | Freq: Two times a day (BID) | ORAL | 10 refills | Status: DC | PRN

## 2016-08-03 ENCOUNTER — Other Ambulatory Visit: Payer: Self-pay | Admitting: Internal Medicine

## 2016-09-01 ENCOUNTER — Other Ambulatory Visit: Payer: Self-pay | Admitting: Primary Care

## 2016-09-01 ENCOUNTER — Ambulatory Visit (AMBULATORY_SURGERY_CENTER): Payer: Self-pay | Admitting: *Deleted

## 2016-09-01 VITALS — Ht 67.0 in | Wt 223.0 lb

## 2016-09-01 DIAGNOSIS — Z86010 Personal history of colonic polyps: Secondary | ICD-10-CM

## 2016-09-01 DIAGNOSIS — G4700 Insomnia, unspecified: Secondary | ICD-10-CM

## 2016-09-01 MED ORDER — NA SULFATE-K SULFATE-MG SULF 17.5-3.13-1.6 GM/177ML PO SOLN: ORAL | 0 refills | Status: DC

## 2016-09-01 NOTE — Telephone Encounter (Signed)
Ok to refill? Electronically refill request for temazepam (RESTORIL) 30 MG capsule. Last prescribed on 06/03/2016. Last seen on 12/30/2015

## 2016-09-01 NOTE — Progress Notes (Signed)
Patient denies any allergies to eggs or soy. Patient denies any problems with anesthesia/sedation. Patient denies any oxygen use at home and does not take any diet/weight loss medications. EMMI education assisgned to patient on colonoscopy, this was explained and instructions given to patient. 

## 2016-09-02 NOTE — Telephone Encounter (Signed)
Called in medication to the pharmacy as instructed. 

## 2016-09-17 ENCOUNTER — Ambulatory Visit (AMBULATORY_SURGERY_CENTER): Payer: Medicare Other | Admitting: Gastroenterology

## 2016-09-17 ENCOUNTER — Encounter: Payer: Self-pay | Admitting: Gastroenterology

## 2016-09-17 VITALS — BP 124/67 | HR 69 | Temp 98.0°F | Resp 12 | Ht 67.0 in | Wt 223.0 lb

## 2016-09-17 DIAGNOSIS — K5730 Diverticulosis of large intestine without perforation or abscess without bleeding: Secondary | ICD-10-CM

## 2016-09-17 DIAGNOSIS — D122 Benign neoplasm of ascending colon: Secondary | ICD-10-CM | POA: Diagnosis not present

## 2016-09-17 DIAGNOSIS — Z1211 Encounter for screening for malignant neoplasm of colon: Secondary | ICD-10-CM | POA: Diagnosis not present

## 2016-09-17 DIAGNOSIS — Z86010 Personal history of colonic polyps: Secondary | ICD-10-CM | POA: Diagnosis not present

## 2016-09-17 MED ORDER — SODIUM CHLORIDE 0.9 % IV SOLN: 500.0000 mL | INTRAVENOUS | Status: DC

## 2016-09-17 NOTE — Progress Notes (Signed)
Called to room to assist during endoscopic procedure.  Patient ID and intended procedure confirmed with present staff. Received instructions for my participation in the procedure from the performing physician.  

## 2016-09-17 NOTE — Progress Notes (Signed)
A and O x3. Report to RN. Tolerated MAC anesthesia well.

## 2016-09-17 NOTE — Op Note (Signed)
[place] Patient Name: Kelsey Weaver Procedure Date: 09/17/2016 1:14 PM MRN: [id number] Endoscopist: Milus Banister , MD Age: 71 Referring MD:  Date of Birth: Dec 20, 1945 Gender: Female Account #: [id number] Procedure:                Colonoscopy Indications:              High risk colon cancer surveillance: Personal                            history of colonic polyps: colonoscopy in 2012 and                            for routine screening. 3 subcentimeter adenomas                            were removed. She had a repeat colonoscopy October                            2014 and this showed a 12 mm ascending colon polyp                            that was also adenomatous. Medicines:                Monitored Anesthesia Care Procedure:                Pre-Anesthesia Assessment:                           - Prior to the procedure, a History and Physical                            was performed, and patient medications and                            allergies were reviewed. The patient's tolerance of                            previous anesthesia was also reviewed. The risks                            and benefits of the procedure and the sedation                            options and risks were discussed with the patient.                            All questions were answered, and informed consent                            was obtained. Prior Anticoagulants: The patient has                            taken no previous anticoagulant or antiplatelet  agents. ASA Grade Assessment: II - A patient with                            mild systemic disease. After reviewing the risks                            and benefits, the patient was deemed in                            satisfactory condition to undergo the procedure.                           After obtaining informed consent, the colonoscope                            was passed under direct vision.  Throughout the                            procedure, the patient's blood pressure, pulse, and                            oxygen saturations were monitored continuously. The                            Colonoscope was introduced through the anus and                            advanced to the the cecum, identified by                            appendiceal orifice and ileocecal valve. The                            colonoscopy was performed without difficulty. The                            patient tolerated the procedure well. The quality                            of the bowel preparation was good. The ileocecal                            valve, appendiceal orifice, and rectum were                            photographed. Scope In: 1:21:07 PM Scope Out: 1:32:56 PM Scope Withdrawal Time: 0 hours 8 minutes 12 seconds  Total Procedure Duration: 0 hours 11 minutes 49 seconds  Findings:                 A 2 mm polyp was found in the ascending colon. The                            polyp was sessile. The polyp was removed with a  cold biopsy forceps. Resection and retrieval were                            complete.                           Multiple small and large-mouthed diverticula were                            found in the left colon.                           The exam was otherwise without abnormality on                            direct and retroflexion views. Complications:            No immediate complications. Estimated blood loss:                            None. Estimated Blood Loss:     Estimated blood loss: none. Impression:               - One 2 mm polyp in the ascending colon, removed                            with a cold biopsy forceps. Resected and retrieved.                           - Diverticulosis in the left colon.                           - The examination was otherwise normal on direct                            and retroflexion  views. Recommendation:           - Patient has a contact number available for                            emergencies. The signs and symptoms of potential                            delayed complications were discussed with the                            patient. Return to normal activities tomorrow.                            Written discharge instructions were provided to the                            patient.                           - Resume previous diet.                           -  Continue present medications.                           You will receive a letter within 2-3 weeks with the                            pathology results and my final recommendations.                           If the polyp(s) is proven to be 'pre-cancerous' on                            pathology, you will need repeat colonoscopy in 5                            years. Milus Banister, MD 09/17/2016 1:35:33 PM This report has been signed electronically.

## 2016-09-17 NOTE — Patient Instructions (Signed)
Impression/recommendations:  Polyp (handout given) Diverticulosis (handout given)  YOU HAD AN ENDOSCOPIC PROCEDURE TODAY AT THE [facility]:   Refer to the procedure report that was given to you for any specific questions about what was found during the examination.  If the procedure report does not answer your questions, please call your gastroenterologist to clarify.  If you requested that your care partner not be given the details of your procedure findings, then the procedure report has been included in a sealed envelope for you to review at your convenience later.  YOU SHOULD EXPECT: Some feelings of bloating in the abdomen. Passage of more gas than usual.  Walking can help get rid of the air that was put into your GI tract during the procedure and reduce the bloating. If you had a lower endoscopy (such as a colonoscopy or flexible sigmoidoscopy) you may notice spotting of blood in your stool or on the toilet paper. If you underwent a bowel prep for your procedure, you may not have a normal bowel movement for a few days.  Please Note:  You might notice some irritation and congestion in your nose or some drainage.  This is from the oxygen used during your procedure.  There is no need for concern and it should clear up in a day or so.  SYMPTOMS TO REPORT IMMEDIATELY:   Following lower endoscopy (colonoscopy or flexible sigmoidoscopy):  Excessive amounts of blood in the stool  Significant tenderness or worsening of abdominal pains  Swelling of the abdomen that is new, acute  Fever of 100F or higher   For urgent or emergent issues, a gastroenterologist can be reached at any hour by calling [phone].   DIET:  We do recommend a small meal at first, but then you may proceed to your regular diet.  Drink plenty of fluids but you should avoid alcoholic beverages for 24 hours.  ACTIVITY:  You should plan to take it easy for the rest of today and you should NOT DRIVE or use  heavy machinery until tomorrow (because of the sedation medicines used during the test).    FOLLOW UP: Our staff will call the number listed on your records the next business day following your procedure to check on you and address any questions or concerns that you may have regarding the information given to you following your procedure. If we do not reach you, we will leave a message.  However, if you are feeling well and you are not experiencing any problems, there is no need to return our call.  We will assume that you have returned to your regular daily activities without incident.  If any biopsies were taken you will be contacted by phone or by letter within the next 1-3 weeks.  Please call us at [phone] if you have not heard about the biopsies in 3 weeks.    SIGNATURES/CONFIDENTIALITY: You and/or your care partner have signed paperwork which will be entered into your electronic medical record.  These signatures attest to the fact that that the information above on your After Visit Summary has been reviewed and is understood.  Full responsibility of the confidentiality of this discharge information lies with you and/or your care-partner. 

## 2016-09-20 ENCOUNTER — Telehealth: Payer: Self-pay

## 2016-09-20 NOTE — Telephone Encounter (Signed)
  Follow up Call-  Call back number 09/17/2016  Post procedure Call Back phone  # [phone]  Permission to leave phone message Yes  Some recent data might be hidden     Patient questions:  Do you have a fever, pain , or abdominal swelling? Yes.   Pain Score  4 *  Have you tolerated food without any problems? Yes.    Have you been able to return to your normal activities? Yes.    Do you have any questions about your discharge instructions: Diet   No. Medications  No. Follow up visit  No.  Do you have questions or concerns about your Care? Yes.    Actions: * If pain score is 4 or above: No action needed, pain <4.  Pt states she is still having abdominal pain in her descending area of her colon.  She said she has no fever, passing gas, tolerated food without problems.  Pt said she remembers this happening last colonoscopy as well.  She said she wants to give this more time and she will call us back if the discomfort does not go away today.  maw

## 2016-09-28 ENCOUNTER — Encounter: Payer: Self-pay | Admitting: Gastroenterology

## 2016-12-02 ENCOUNTER — Other Ambulatory Visit: Payer: Self-pay | Admitting: Primary Care

## 2016-12-02 DIAGNOSIS — G4700 Insomnia, unspecified: Secondary | ICD-10-CM

## 2016-12-02 NOTE — Progress Notes (Signed)
PCP notes:   Health maintenance: Mammogram - due.   Abnormal screenings: None.   Patient concerns:  Hx of foot surgery, feet are uncomfortable and walking wrong on feet. Pt would like a good podiatrist.   Right ear pain intermittent. Pt would like to see if any inflammation present.  Needs a referral to dermatology (a good one).   Nurse concerns: None.   Next PCP appt:

## 2016-12-02 NOTE — Progress Notes (Signed)
Subjective:   Kelsey Weaver is a 71 y.o. female who presents for an Initial Medicare Annual Wellness Visit.  The Patient was informed that the wellness visit is to identify future health risk and educate and initiate measures that can reduce risk for increased disease through the lifespan.   Describes health as fair.    Review of Systems    No ROS.  Medicare Wellness Visit. Additional risk factors are reflected in the social history.   Cardiac Risk Factors include: advanced age (>67men, >35 women);dyslipidemia;obesity (BMI >30kg/m2)     Objective:    Today's Vitals   12/03/16 0959  BP: 126/72  Pulse: 66  Resp: 16  SpO2: 99%  Weight: 223 lb (101.2 kg)  Height: 5' 5.35" (1.66 m)   Body mass index is 36.71 kg/m.   Current Medications (verified) Outpatient Encounter Prescriptions as of 12/03/2016  Medication Sig  . ALPRAZolam (XANAX) 0.5 MG tablet Take 0.5 mg by mouth at bedtime as needed for anxiety.  Marland Kitchen aspirin 81 MG tablet Take 81 mg by mouth daily.  Marland Kitchen atenolol (TENORMIN) 25 MG tablet Take 1 tablet (25 mg total) by mouth daily as needed (heart rate). (Patient taking differently: Take 25 mg by mouth daily. )  . famotidine (PEPCID AC MAXIMUM STRENGTH) 20 MG tablet Take 20 mg by mouth at bedtime.   . flecainide (TAMBOCOR) 50 MG tablet Take 1 tablet (50 mg total) by mouth 2 (two) times daily as needed (AFIB). (Patient taking differently: Take 50 mg by mouth 2 (two) times daily. )  . ibuprofen (ADVIL,MOTRIN) 800 MG tablet Take 800 mg by mouth as needed.   . Multiple Vitamin (MULTIVITAMIN) tablet Take 1 tablet by mouth daily.  Marland Kitchen OVER THE COUNTER MEDICATION Take 1 capsule by mouth daily. MegaRed Omega-3 Krill Oil 1,000 mg-230 mg-60 mg  . pravastatin (PRAVACHOL) 20 MG tablet Take 1 tablet (20 mg total) by mouth daily.  . temazepam (RESTORIL) 30 MG capsule TAKE 1 CAPSULE BY MOUTH AT BEDTIME AS NEEDED FOR SLEEP (Patient taking differently: TAKE 1 CAPSULE BY MOUTH AT BEDTIME Daily)    . traMADol (ULTRAM) 50 MG tablet Take 50 mg by mouth 2 (two) times daily as needed for pain.  Marland Kitchen triamterene-hydrochlorothiazide (MAXZIDE-25) 37.5-25 MG tablet Take 0.5 tablets by mouth daily as needed (Edema).   . [DISCONTINUED] GARCINIA CAMBOGIA-CHROMIUM PO Take 2 tablets by mouth daily.   Facility-Administered Encounter Medications as of 12/03/2016  Medication  . 0.9 %  sodium chloride infusion    Allergies (verified) Ambien [zolpidem tartrate]; Compazine [prochlorperazine edisylate]; Metformin and related; Metoprolol; and Reglan [metoclopramide]   History: Past Medical History:  Diagnosis Date  . Cardiac arrhythmia due to congenital heart disease   . Diverticulosis   . DJD (degenerative joint disease)    frozen shoulder  . 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 GERD (gastroesophageal reflux disease)   . History of colonic polyps   . Hyperlipemia   . Overweight   . Paroxysmal atrial fibrillation ([place]) 09/2000   Past Surgical History:  Procedure Laterality Date  . ABLATION  10/31/2007, 08/26/2008, 09/22/2012, 07/01/2015   AFib ablation x 4 in [place]  . COLONOSCOPY    . CYSTECTOMY     subsebacous x 2  . DILATION AND CURETTAGE OF UTERUS  at age 36  . FOOT SURGERY  2008, 2014  . implantable loop recorder pacement  01/19/13   MDT Reveal LINQ implanted in [place] for afib management  . KNEE  SURGERY Left 2016   arthoscopy   . LIGATION / DIVISION SAPHENOUS VEIN Bilateral   . POLYPECTOMY     Family History  Problem Relation Age of Onset  . Hypertension Mother   . Glaucoma Mother   . CAD Father   . Atrial fibrillation Father   . CAD Brother   . Hypertension Brother   . Gallbladder disease Sister   . Atrial fibrillation Brother   . Hypercholesterolemia Brother   . Cholelithiasis Sister   . Colon cancer Neg Hx    Social History   Occupational History  . Retired Therapist, sports    Social History Main Topics  . Smoking status: Never Smoker  . Smokeless tobacco: Never Used  .  Alcohol use No  . Drug use: No  . Sexual activity: Not on file    Tobacco Counseling Counseling given: Not Answered   Activities of Daily Living In your present state of health, do you have any difficulty performing the following activities: 12/03/2016  Hearing? N  Vision? N  Difficulty concentrating or making decisions? N  Walking or climbing stairs? N  Dressing or bathing? N  Doing errands, shopping? N  Preparing Food and eating ? N  Using the Toilet? N  In the past six months, have you accidently leaked urine? N  Do you have problems with loss of bowel control? N  Managing your Medications? N  Managing your Finances? N  Housekeeping or managing your Housekeeping? N  Some recent data might be hidden    Immunizations and Health Maintenance Immunization History  Administered Date(s) Administered  . Influenza, High Dose Seasonal PF 02/17/2016  . Pneumococcal Conjugate-13 05/10/2014  . Pneumococcal Polysaccharide-23 02/13/2015  . Tdap 05/10/2010  . Zoster 06/10/2014   There are no preventive care reminders to display for this patient.  Patient Care Team: Pleas Koch, NP as PCP - General (Internal Medicine) Berenice Primas, MD as Referring Physician (Orthopedic Surgery)  Indicate any recent Medical Services you may have received from other than [facility] providers in the past year (date may be approximate).     Assessment:   This is a routine wellness examination for [facility]. Physical assessment deferred to PCP.   Hearing/Vision screen  Hearing Screening   125Hz  250Hz  500Hz  1000Hz  2000Hz  3000Hz  4000Hz  6000Hz  8000Hz   Right ear:   0 0 40  0    Left ear:   40 0 40  0      Visual Acuity Screening   Right eye Left eye Both eyes  Without correction:     With correction: 20/100 20/40 20/30    Dietary issues and exercise activities discussed: Current Exercise Habits: Home exercise routine, Type of exercise: walking, Time (Minutes): 35, Frequency (Times/Week): 5,  Weekly Exercise (Minutes/Week): 175, Intensity: Mild  Goals    . Weight (lb) < 200 lb (90.7 kg)          Stay out of kitchen, increasing walking.      Depression Screen PHQ 2/9 Scores 12/03/2016  PHQ - 2 Score 0    Fall Risk Fall Risk  12/03/2016  Falls in the past year? No    Cognitive Function: PLEASE NOTE: A Mini-Cog screen was completed. Maximum score is 20. A value of 0 denotes this part of [place]stein MMSE was not completed or the patient failed this part of the Mini-Cog screening.   Mini-Cog Screening Orientation to Time - Max 5 pts Orientation to Place - Max 5 pts Registration - Max 3 pts Recall -  Max 3 pts Language Repeat - Max 1 pts Language Follow 3 Step Command - Max 3 pts      Mini-Cog - 12/03/16 1016    Normal clock drawing test? yes   How many words correct? 3      MMSE - Mini Mental State Exam 12/03/2016  Orientation to time 5  Orientation to Place 5  Registration 3  Attention/ Calculation 0  Recall 3  Language- name 2 objects 0  Language- repeat 1  Language- follow 3 step command 3  Language- read & follow direction 0  Write a sentence 0  Copy design 0  Total score 20        Screening Tests Health Maintenance  Topic Date Due  . INFLUENZA VACCINE  12/08/2016  . MAMMOGRAM  08/07/2017  . TETANUS/TDAP  05/10/2020  . COLONOSCOPY  09/17/2021  . DEXA SCAN  Completed  . Hepatitis C Screening  Completed  . PNA vac Low Risk Adult  Completed      Plan:   Follow up with PCP as directed.  I have personally reviewed and noted the following in the patient's chart:   . Medical and social history . Use of alcohol, tobacco or illicit drugs  . Current medications and supplements . Functional ability and status . Nutritional status . Physical activity . Advanced directives . List of other physicians . Vitals . Screenings to include cognitive, depression, and falls . Referrals and appointments  In addition, I have reviewed and discussed with  patient certain preventive protocols, quality metrics, and best practice recommendations. A written personalized care plan for preventive services as well as general preventive health recommendations were provided to patient.     Ree Edman, RN   12/03/2016

## 2016-12-02 NOTE — Telephone Encounter (Signed)
Needs follow up visit for any additional refills. Please phone in temazepam 30 mg. Take 1 capsule by mouth at bedtime as needed for sleep. #30, no refills. Please make note that we need UDS and contract UTD.

## 2016-12-02 NOTE — Telephone Encounter (Signed)
Ok to refill? Electronically refill request for temazepam (RESTORIL) 30 MG capsule  Last prescribed on 09/01/2016. Last seen on 12/30/2015. No future appt.

## 2016-12-02 NOTE — Telephone Encounter (Signed)
Medication phoned to pharmacy. Left detailed message on voicemail that patient needs OV prior to further refills and left that same message with Rx call in to pharmacy.  Note sent to [facility] for UDS and contract UTD.

## 2016-12-03 ENCOUNTER — Ambulatory Visit (INDEPENDENT_AMBULATORY_CARE_PROVIDER_SITE_OTHER): Payer: Medicare Other

## 2016-12-03 ENCOUNTER — Other Ambulatory Visit: Payer: Self-pay | Admitting: Primary Care

## 2016-12-03 ENCOUNTER — Other Ambulatory Visit (INDEPENDENT_AMBULATORY_CARE_PROVIDER_SITE_OTHER): Payer: Medicare Other

## 2016-12-03 VITALS — BP 126/72 | HR 66 | Resp 16 | Ht 65.35 in | Wt 223.0 lb

## 2016-12-03 DIAGNOSIS — E785 Hyperlipidemia, unspecified: Secondary | ICD-10-CM

## 2016-12-03 DIAGNOSIS — R7303 Prediabetes: Secondary | ICD-10-CM

## 2016-12-03 DIAGNOSIS — I10 Essential (primary) hypertension: Secondary | ICD-10-CM | POA: Diagnosis not present

## 2016-12-03 DIAGNOSIS — Z0000 Encounter for general adult medical examination without abnormal findings: Secondary | ICD-10-CM

## 2016-12-03 DIAGNOSIS — Z0184 Encounter for antibody response examination: Secondary | ICD-10-CM

## 2016-12-03 LAB — COMPREHENSIVE METABOLIC PANEL
ALT: 24 U/L (ref 0–35)
AST: 21 U/L (ref 0–37)
Albumin: 4.3 g/dL (ref 3.5–5.2)
Alkaline Phosphatase: 55 U/L (ref 39–117)
BUN: 27 mg/dL — ABNORMAL HIGH (ref 6–23)
CO2: 27 mEq/L (ref 19–32)
Calcium: 9.7 mg/dL (ref 8.4–10.5)
Chloride: 101 mEq/L (ref 96–112)
Creatinine, Ser: 0.81 mg/dL (ref 0.40–1.20)
GFR: 74.13 mL/min (ref >60.00)
Glucose, Bld: 110 mg/dL — ABNORMAL HIGH (ref 70–99)
Potassium: 4 mEq/L (ref 3.5–5.1)
Sodium: 137 mEq/L (ref 135–145)
Total Bilirubin: 0.7 mg/dL (ref 0.2–1.2)
Total Protein: 7.1 g/dL (ref 6.0–8.3)

## 2016-12-03 LAB — LIPID PANEL
Cholesterol: 147 mg/dL (ref 0–200)
HDL: 42.3 mg/dL (ref >39.00)
LDL Cholesterol: 78 mg/dL (ref 0–99)
NonHDL: 105.17
Total CHOL/HDL Ratio: 3
Triglycerides: 138 mg/dL (ref 0.0–149.0)
VLDL: 27.6 mg/dL (ref 0.0–40.0)

## 2016-12-03 LAB — HEMOGLOBIN A1C: Hgb A1c MFr Bld: 6.6 % — ABNORMAL HIGH (ref 4.6–6.5)

## 2016-12-03 NOTE — Patient Instructions (Addendum)
Kelsey Weaver ,  Bring a copy of your advance directives to your next office visit.  Thank you for taking time to come for your Medicare Wellness Visit. I appreciate your ongoing commitment to your health goals. Please review the following plan we discussed and let me know if I can assist you in the future.   These are the goals we discussed: Goals    . Weight (lb) < 200 lb (90.7 kg)          Stay out of kitchen, increasing walking.       This is a list of the screening recommended for you and due dates:  Health Maintenance  Topic Date Due  . Flu Shot  12/08/2016  . Mammogram  08/07/2017  . Tetanus Vaccine  05/10/2020  . Colon Cancer Screening  09/17/2021  . DEXA scan (bone density measurement)  Completed  .  Hepatitis C: One time screening is recommended by Center for Disease Control  (CDC) for  adults born from [birth year] through 1965.   Completed  . Pneumonia vaccines  Completed    Preventive Care for Adults  A healthy lifestyle and preventive care can promote health and wellness. Preventive health guidelines for adults include the following key practices.  . A routine yearly physical is a good way to check with your health care provider about your health and preventive screening. It is a chance to share any concerns and updates on your health and to receive a thorough exam.  . Visit your dentist for a routine exam and preventive care every 6 months. Brush your teeth twice a day and floss once a day. Good oral hygiene prevents tooth decay and gum disease.  . The frequency of eye exams is based on your age, health, family medical history, use  of contact lenses, and other factors. Follow your health care provider's ecommendations for frequency of eye exams.  . Eat a healthy diet. Foods like vegetables, fruits, whole grains, low-fat dairy products, and lean protein foods contain the nutrients you need without too many calories. Decrease your intake of foods high in solid fats, added  sugars, and salt. Eat the right amount of calories for you. Get information about a proper diet from your health care provider, if necessary.  . Regular physical exercise is one of the most important things you can do for your health. Most adults should get at least 150 minutes of moderate-intensity exercise (any activity that increases your heart rate and causes you to sweat) each week. In addition, most adults need muscle-strengthening exercises on 2 or more days a week.  Silver Sneakers may be a benefit available to you. To determine eligibility, you may visit the website: www.silversneakers.com or contact program at (845)206-6094 Mon-Fri between 8AM-8PM.   . Maintain a healthy weight. The body mass index (BMI) is a screening tool to identify possible weight problems. It provides an estimate of body fat based on height and weight. Your health care provider can find your BMI and can help you achieve or maintain a healthy weight.   For adults 20 years and older: ? A BMI below 18.5 is considered underweight. ? A BMI of 18.5 to 24.9 is normal. ? A BMI of 25 to 29.9 is considered overweight. ? A BMI of 30 and above is considered obese.   . Maintain normal blood lipids and cholesterol levels by exercising and minimizing your intake of saturated fat. Eat a balanced diet with plenty of fruit and vegetables. Blood tests  for lipids and cholesterol should begin at age 92 and be repeated every 5 years. If your lipid or cholesterol levels are high, you are over 50, or you are at high risk for heart disease, you may need your cholesterol levels checked more frequently. Ongoing high lipid and cholesterol levels should be treated with medicines if diet and exercise are not working.  . If you smoke, find out from your health care provider how to quit. If you do not use tobacco, please do not start.  . If you choose to drink alcohol, please do not consume more than 2 drinks per day. One drink is considered to  be 12 ounces (355 mL) of beer, 5 ounces (148 mL) of wine, or 1.5 ounces (44 mL) of liquor.  . If you are 21-49 years old, ask your health care provider if you should take aspirin to prevent strokes.  . Use sunscreen. Apply sunscreen liberally and repeatedly throughout the day. You should seek shade when your shadow is shorter than you. Protect yourself by wearing long sleeves, pants, a wide-brimmed hat, and sunglasses year round, whenever you are outdoors.  . Once a month, do a whole body skin exam, using a mirror to look at the skin on your back. Tell your health care provider of new moles, moles that have irregular borders, moles that are larger than a pencil eraser, or moles that have changed in shape or color.

## 2016-12-03 NOTE — Progress Notes (Signed)
I reviewed health advisor's note, was available for consultation, and agree with documentation and plan.  

## 2016-12-04 DIAGNOSIS — H25011 Cortical age-related cataract, right eye: Secondary | ICD-10-CM | POA: Diagnosis not present

## 2016-12-04 DIAGNOSIS — H524 Presbyopia: Secondary | ICD-10-CM | POA: Diagnosis not present

## 2016-12-09 ENCOUNTER — Ambulatory Visit (INDEPENDENT_AMBULATORY_CARE_PROVIDER_SITE_OTHER): Payer: Medicare Other | Admitting: Primary Care

## 2016-12-09 ENCOUNTER — Encounter: Payer: Self-pay | Admitting: Primary Care

## 2016-12-09 VITALS — BP 124/74 | HR 72 | Temp 98.2°F | Wt 223.0 lb

## 2016-12-09 DIAGNOSIS — Z1231 Encounter for screening mammogram for malignant neoplasm of breast: Secondary | ICD-10-CM

## 2016-12-09 DIAGNOSIS — I480 Paroxysmal atrial fibrillation: Secondary | ICD-10-CM

## 2016-12-09 DIAGNOSIS — E119 Type 2 diabetes mellitus without complications: Secondary | ICD-10-CM | POA: Diagnosis not present

## 2016-12-09 DIAGNOSIS — E784 Other hyperlipidemia: Secondary | ICD-10-CM | POA: Diagnosis not present

## 2016-12-09 DIAGNOSIS — G8929 Other chronic pain: Secondary | ICD-10-CM

## 2016-12-09 DIAGNOSIS — Z1283 Encounter for screening for malignant neoplasm of skin: Secondary | ICD-10-CM

## 2016-12-09 DIAGNOSIS — M79673 Pain in unspecified foot: Secondary | ICD-10-CM

## 2016-12-09 DIAGNOSIS — E7849 Other hyperlipidemia: Secondary | ICD-10-CM

## 2016-12-09 DIAGNOSIS — Z1239 Encounter for other screening for malignant neoplasm of breast: Secondary | ICD-10-CM

## 2016-12-09 DIAGNOSIS — G4700 Insomnia, unspecified: Secondary | ICD-10-CM | POA: Diagnosis not present

## 2016-12-09 MED ORDER — TRAZODONE HCL 50 MG PO TABS: 50.0000 mg | ORAL_TABLET | Freq: Every evening | ORAL | 0 refills | Status: DC | PRN

## 2016-12-09 NOTE — Assessment & Plan Note (Signed)
Rate and rhythm regular today. Continue flecainide, atenolol. Following with cardiology.

## 2016-12-09 NOTE — Progress Notes (Signed)
Subjective:    Patient ID: Kelsey Weaver, female    DOB: May 18, 1945, 71 y.o.   MRN: [id number]  HPI  Kelsey Weaver is a 71 year old female who presents today for [place] [facility]. She saw our health advisor last week.  1) Atrial Fibrillation: Currently managed on flecainide 50 mg and atenolol 25 mg. Following with cardiology. Due in February 2019. She denies palpitations and chest pain.  2) Hyperlipidemia: Currently managed on pravastatin 20 mg, recent lipid panel with LDL of 78, TC of 147.  3) Type 2 Diabetes: History of prediabetes. Recent A1C of 6.6 which is an improvement from 6.9 in October 2017.  Diet currently consists of:  Breakfast: Eggs Lunch: Left overs, salad, sandwich Dinner: Restaurants, meat, vegetables Snacks: Occasionally nuts, fruit Desserts: None Beverages: Water, some diet Coke  Exercise: Not currently exercising.  4) Insomnia: Currently managed on Temazepam 30 mg. Has noticed the 30 mg has affected her memory, also noticed a decrease in effectiveness as she will wake during the middle of the night and cannot fall asleep. She's tried Ambien In the past but had nightmares, numerous OTC products without improvement. She has never tried trazodone.  5) Foot Pain: Chronic pain. Numerous surgeries including bunion repair, fusion of left great toe, hammer toe repair to left foot, hammer toe repair to right toes. She would like to get set up with a podiatrist locally as she was once seeing one in [place].   Review of Systems  Constitutional: Negative for unexpected weight change.  HENT: Negative for rhinorrhea.   Respiratory: Negative for cough and shortness of breath.   Cardiovascular: Negative for chest pain.  Gastrointestinal: Negative for constipation and diarrhea.  Genitourinary: Negative for difficulty urinating and menstrual problem.  Musculoskeletal: Negative for arthralgias and myalgias.  Skin: Negative for rash.  Allergic/Immunologic: Negative for environmental  allergies.  Neurological: Negative for dizziness, numbness and headaches.  Psychiatric/Behavioral: Positive for sleep disturbance.       Past Medical History:  Diagnosis Date  . Cardiac arrhythmia due to congenital heart disease   . Diverticulosis   . DJD (degenerative joint disease)    frozen shoulder  . 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 GERD (gastroesophageal reflux disease)   . History of colonic polyps   . Hyperlipemia   . Overweight   . Paroxysmal atrial fibrillation ([place]) 09/2000     Social History   Social History  . Marital status: Married    Spouse name: N/A  . Number of children: 3  . Years of education: N/A   Occupational History  . Retired Therapist, sports    Social History Main Topics  . Smoking status: Never Smoker  . Smokeless tobacco: Never Used  . Alcohol use No  . Drug use: No  . Sexual activity: Not on file   Other Topics Concern  . 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    Once worked in L&D and NICU.    Son is a Marine scientist at [employer] and also works for [employer] .   Enjoys spending time with her family.     Past Surgical History:  Procedure Laterality Date  . ABLATION  10/31/2007, 08/26/2008, 09/22/2012, 07/01/2015   AFib ablation x 4 in [place]  . COLONOSCOPY    . CYSTECTOMY     subsebacous x 2  . DILATION AND CURETTAGE OF UTERUS  at age 71  . FOOT SURGERY  2008, 2014  . implantable loop recorder pacement  01/19/13   MDT Reveal LINQ implanted in [place] for afib management  . KNEE SURGERY Left 2016   arthoscopy   . LIGATION / DIVISION SAPHENOUS VEIN Bilateral   . POLYPECTOMY      Family History  Problem Relation Age of Onset  . Hypertension Mother   . Glaucoma Mother   . CAD Father   . Atrial fibrillation Father   . CAD Brother   . Hypertension Brother   . Gallbladder disease Sister   . Atrial fibrillation Brother   . Hypercholesterolemia Brother   .  Cholelithiasis Sister   . Colon cancer Neg Hx     Allergies  Allergen Reactions  . Ambien [Zolpidem Tartrate] Other (See Comments)    Fatigue, Abdomen pain.  . Compazine [Prochlorperazine Edisylate] Other (See Comments)    Aches and pains, generalized  . Metformin And Related Nausea Only  . Metoprolol Other (See Comments)    Depression   . Reglan [Metoclopramide] Anxiety    Current Outpatient Prescriptions on File Prior to Visit  Medication Sig Dispense Refill  . ALPRAZolam (XANAX) 0.5 MG tablet Take 0.5 mg by mouth at bedtime as needed for anxiety.    Marland Kitchen aspirin 81 MG tablet Take 81 mg by mouth daily.    Marland Kitchen atenolol (TENORMIN) 25 MG tablet Take 1 tablet (25 mg total) by mouth daily as needed (heart rate). (Patient taking differently: Take 25 mg by mouth daily. ) 30 tablet 10  . famotidine (PEPCID AC MAXIMUM STRENGTH) 20 MG tablet Take 20 mg by mouth at bedtime.     . flecainide (TAMBOCOR) 50 MG tablet Take 1 tablet (50 mg total) by mouth 2 (two) times daily as needed (AFIB). (Patient taking differently: Take 50 mg by mouth 2 (two) times daily. ) 60 tablet 10  . ibuprofen (ADVIL,MOTRIN) 800 MG tablet Take 800 mg by mouth as needed.     . Multiple Vitamin (MULTIVITAMIN) tablet Take 1 tablet by mouth daily.    Marland Kitchen OVER THE COUNTER MEDICATION Take 1 capsule by mouth daily. MegaRed Omega-3 Krill Oil 1,000 mg-230 mg-60 mg    . pravastatin (PRAVACHOL) 20 MG tablet Take 1 tablet (20 mg total) by mouth daily. 90 tablet 3  . traMADol (ULTRAM) 50 MG tablet Take 50 mg by mouth 2 (two) times daily as needed for pain.    Marland Kitchen triamterene-hydrochlorothiazide (MAXZIDE-25) 37.5-25 MG tablet Take 0.5 tablets by mouth daily as needed (Edema).      Current Facility-Administered Medications on File Prior to Visit  Medication Dose Route Frequency Provider Last Rate Last Dose  . 0.9 %  sodium chloride infusion  500 mL Intravenous Continuous Milus Banister, MD        BP 124/74   Pulse 72   Temp 98.2 F  (36.8 C) (Oral)   Wt 223 lb (101.2 kg)   SpO2 96%   BMI 36.71 kg/m    Objective:   Physical Exam  Constitutional: She is oriented to person, place, and time. She appears well-nourished.  HENT:  Right Ear: Tympanic membrane and ear canal normal.  Left Ear: Tympanic membrane and ear canal normal.  Nose: Nose normal.  Mouth/Throat: Oropharynx is clear and moist.  Eyes: Pupils are equal, round, and reactive to light. Conjunctivae and EOM are normal.  Neck: Neck supple. No thyromegaly present.  Cardiovascular: Normal rate and regular rhythm.   No murmur heard. Pulmonary/Chest:  Effort normal and breath sounds normal. She has no rales.  Abdominal: Soft. Bowel sounds are normal. There is no tenderness.  Musculoskeletal: Normal range of motion.  Lymphadenopathy:    She has no cervical adenopathy.  Neurological: She is alert and oriented to person, place, and time. She has normal reflexes. No cranial nerve deficit.  Skin: Skin is warm and dry. No rash noted.  Psychiatric: She has a normal mood and affect.          Assessment & Plan:  Chronic foot pain:  History of numerous surgeries to bilateral feet including hammertoe repair, bunion repair, etc. Referral placed to podiatry per patient request. She was following with a podiatrist in [place].  Sheral Flow, NP

## 2016-12-09 NOTE — Assessment & Plan Note (Signed)
Recent A1c is 6.6, improved from 6.9 in October 2017. We'll have her continue to work on changes in diet and regular exercise. Recheck A1c in 6 months.

## 2016-12-09 NOTE — Patient Instructions (Signed)
Wean off of Temazepam. Start by taking 1 capsule by mouth every other day for 8-10 days, then stop.   Start Trazodone 50 mg tablet on day 4-5, start with 1 tablet and slowly titrate up by 1/2 tablet as needed. Be careful as this may cause increased sedation.  You will be contacted regarding your referral to Podiatry and Dermatology.  Please let us know if you have not heard back within one week.   Schedule your mammogram as discussed.  Continue to work on improvements in diet and regular exercise.   Ensure you are consuming 64 ounces of water daily.  Start exercising. You should be getting 150 minutes of moderate intensity exercise weekly.  Follow up in 1 year for your annual exam or sooner if needed.  It was a pleasure to see you today!

## 2016-12-09 NOTE — Assessment & Plan Note (Signed)
Lipid panel stable on recent labs, continue pravastatin.

## 2016-12-09 NOTE — Assessment & Plan Note (Signed)
Temazepam 30 mg not as effective. Will wean her off slowly and transition to trazodone. She has failed numerous OTC products. Instructions provided today, she will update through my chart.

## 2016-12-23 DIAGNOSIS — H52221 Regular astigmatism, right eye: Secondary | ICD-10-CM | POA: Diagnosis not present

## 2016-12-23 DIAGNOSIS — H25811 Combined forms of age-related cataract, right eye: Secondary | ICD-10-CM | POA: Diagnosis not present

## 2016-12-29 DIAGNOSIS — H25812 Combined forms of age-related cataract, left eye: Secondary | ICD-10-CM | POA: Diagnosis not present

## 2016-12-29 DIAGNOSIS — H2511 Age-related nuclear cataract, right eye: Secondary | ICD-10-CM | POA: Diagnosis not present

## 2016-12-29 DIAGNOSIS — H52221 Regular astigmatism, right eye: Secondary | ICD-10-CM | POA: Diagnosis not present

## 2016-12-29 DIAGNOSIS — H25811 Combined forms of age-related cataract, right eye: Secondary | ICD-10-CM | POA: Diagnosis not present

## 2017-01-03 ENCOUNTER — Ambulatory Visit (INDEPENDENT_AMBULATORY_CARE_PROVIDER_SITE_OTHER): Payer: Medicare Other

## 2017-01-03 ENCOUNTER — Encounter: Payer: Self-pay | Admitting: Podiatry

## 2017-01-03 ENCOUNTER — Ambulatory Visit (INDEPENDENT_AMBULATORY_CARE_PROVIDER_SITE_OTHER): Payer: Medicare Other | Admitting: Podiatry

## 2017-01-03 VITALS — BP 119/66 | HR 73 | Resp 16

## 2017-01-03 DIAGNOSIS — M19072 Primary osteoarthritis, left ankle and foot: Secondary | ICD-10-CM | POA: Diagnosis not present

## 2017-01-03 DIAGNOSIS — M19071 Primary osteoarthritis, right ankle and foot: Secondary | ICD-10-CM

## 2017-01-03 DIAGNOSIS — M779 Enthesopathy, unspecified: Secondary | ICD-10-CM

## 2017-01-03 DIAGNOSIS — Q6650 Congenital pes planus, unspecified foot: Secondary | ICD-10-CM | POA: Diagnosis not present

## 2017-01-03 DIAGNOSIS — G5790 Unspecified mononeuropathy of unspecified lower limb: Secondary | ICD-10-CM

## 2017-01-03 MED ORDER — GABAPENTIN 300 MG PO CAPS: 300.0000 mg | ORAL_CAPSULE | Freq: Every day | ORAL | 3 refills | Status: DC

## 2017-01-03 NOTE — Progress Notes (Signed)
   Subjective:    Patient ID: Kelsey Weaver, female    DOB: Sep 30, 1945, 71 y.o.   MRN: [id number]  HPI: She presents today with chief complaint of pain in her feet plantar distal half of the foot. She states that is most evident after she gets off her feet or all she's in bed. She denies any calf pain or gluteal pain with ambulation. She states that this is been going on now for the past several years she's had multiple previous foot surgeries including fusion first metatarsophalangeal joint left and toes bilaterally. She is questioning as to whether an orthotic would be of benefit diet-controlled diabetic hemoglobin A1c 6.5. Highest ever has been 6.9.    Review of Systems  All other systems reviewed and are negative.      Objective:   Physical Exam: Vital signs are stable alert and oriented 3. Pulses are palpable. Neurologic sensorium is intact. Deep tendon reflexes are intact. Muscle strength is 5 over 5 dorsiflexion plantar flexors and inverters everters all his muscular sutures intact. Orthopedic evaluation demonstrates an hemostasis and perform range of motion but crepitation that she does have fused toes bilaterally she also has effusion first metatarsophalangeal joint left foot. She has no reproducible pain. Cutaneous evaluation does not demonstrate any open lesions or wounds. Mildly thickened toenails possibly mycotic.        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

## 2017-01-13 DIAGNOSIS — H25812 Combined forms of age-related cataract, left eye: Secondary | ICD-10-CM | POA: Diagnosis not present

## 2017-01-13 DIAGNOSIS — H2512 Age-related nuclear cataract, left eye: Secondary | ICD-10-CM | POA: Diagnosis not present

## 2017-01-27 DIAGNOSIS — L720 Epidermal cyst: Secondary | ICD-10-CM | POA: Diagnosis not present

## 2017-01-27 DIAGNOSIS — L570 Actinic keratosis: Secondary | ICD-10-CM | POA: Diagnosis not present

## 2017-01-27 DIAGNOSIS — L578 Other skin changes due to chronic exposure to nonionizing radiation: Secondary | ICD-10-CM | POA: Diagnosis not present

## 2017-01-27 DIAGNOSIS — L814 Other melanin hyperpigmentation: Secondary | ICD-10-CM | POA: Diagnosis not present

## 2017-01-27 DIAGNOSIS — L821 Other seborrheic keratosis: Secondary | ICD-10-CM | POA: Diagnosis not present

## 2017-01-27 DIAGNOSIS — L918 Other hypertrophic disorders of the skin: Secondary | ICD-10-CM | POA: Diagnosis not present

## 2017-02-07 ENCOUNTER — Encounter: Payer: Self-pay | Admitting: Podiatry

## 2017-02-07 ENCOUNTER — Ambulatory Visit (INDEPENDENT_AMBULATORY_CARE_PROVIDER_SITE_OTHER): Payer: Medicare Other | Admitting: Podiatry

## 2017-02-07 DIAGNOSIS — G5790 Unspecified mononeuropathy of unspecified lower limb: Secondary | ICD-10-CM | POA: Diagnosis not present

## 2017-02-07 MED ORDER — DULOXETINE HCL 20 MG PO CPEP: 20.0000 mg | ORAL_CAPSULE | Freq: Every day | ORAL | 3 refills | Status: DC

## 2017-02-07 NOTE — Progress Notes (Signed)
She presents today for follow-up of her neuropathy states that she feels like she is about 75% improved while taking gabapentin however she feels that it has caused her to fall into a depression. She is wondering if there is any other drugs that can be used other than gabapentin may not result in depression.  Objective: Physical findings are the same. Pulses remain strong and palpable.  Assessment: Neuropathy.  Plan: She I discussed other options today explained to her that most any of these medications can cause depression however I feel that there will should not be any complications if we start her on Cymbalta 20 mg 1 at night time to help assist with her podiatric neuropathic pain. I did instruct her to slowly come off the gabapentin over 1-2 weeks every other night. Left follow-up with her in 1 month.

## 2017-02-08 ENCOUNTER — Encounter: Payer: Self-pay | Admitting: Primary Care

## 2017-02-08 ENCOUNTER — Other Ambulatory Visit: Payer: Self-pay | Admitting: Primary Care

## 2017-02-08 DIAGNOSIS — G4700 Insomnia, unspecified: Secondary | ICD-10-CM

## 2017-02-08 NOTE — Telephone Encounter (Signed)
Ok to refill? Electronically refill request for traZODone (DESYREL) 50 MG tablet  Last prescribed and seen on 12/09/2016.   Also patient send a message through MyChart to let Kelsey Weaver know she is doing well on this.

## 2017-02-08 NOTE — Telephone Encounter (Signed)
Noted, Rx sent to pharmacy. 

## 2017-02-25 DIAGNOSIS — Z23 Encounter for immunization: Secondary | ICD-10-CM | POA: Diagnosis not present

## 2017-02-28 ENCOUNTER — Encounter: Payer: Self-pay | Admitting: Podiatry

## 2017-03-07 ENCOUNTER — Ambulatory Visit: Payer: Medicare Other | Admitting: Podiatry

## 2017-03-18 DIAGNOSIS — M19012 Primary osteoarthritis, left shoulder: Secondary | ICD-10-CM | POA: Diagnosis not present

## 2017-03-18 DIAGNOSIS — M542 Cervicalgia: Secondary | ICD-10-CM | POA: Diagnosis not present

## 2017-03-18 DIAGNOSIS — M25512 Pain in left shoulder: Secondary | ICD-10-CM | POA: Diagnosis not present

## 2017-03-18 DIAGNOSIS — M7582 Other shoulder lesions, left shoulder: Secondary | ICD-10-CM | POA: Diagnosis not present

## 2017-04-06 ENCOUNTER — Other Ambulatory Visit: Payer: Self-pay | Admitting: Primary Care

## 2017-04-06 DIAGNOSIS — E785 Hyperlipidemia, unspecified: Secondary | ICD-10-CM

## 2017-05-09 ENCOUNTER — Encounter: Payer: Self-pay | Admitting: Primary Care

## 2017-05-09 DIAGNOSIS — G4700 Insomnia, unspecified: Secondary | ICD-10-CM

## 2017-05-12 MED ORDER — DOXEPIN HCL 3 MG PO TABS: ORAL_TABLET | ORAL | 0 refills | Status: DC

## 2017-05-23 ENCOUNTER — Encounter: Payer: Self-pay | Admitting: Podiatry

## 2017-05-28 ENCOUNTER — Encounter: Payer: Self-pay | Admitting: Primary Care

## 2017-05-28 DIAGNOSIS — G4700 Insomnia, unspecified: Secondary | ICD-10-CM

## 2017-05-30 MED ORDER — DOXEPIN HCL 3 MG PO TABS: ORAL_TABLET | ORAL | 0 refills | Status: DC

## 2017-06-01 ENCOUNTER — Encounter: Payer: Self-pay | Admitting: Primary Care

## 2017-06-01 ENCOUNTER — Ambulatory Visit (INDEPENDENT_AMBULATORY_CARE_PROVIDER_SITE_OTHER): Payer: Medicare Other | Admitting: Podiatry

## 2017-06-01 DIAGNOSIS — G4700 Insomnia, unspecified: Secondary | ICD-10-CM

## 2017-06-01 DIAGNOSIS — G5790 Unspecified mononeuropathy of unspecified lower limb: Secondary | ICD-10-CM

## 2017-06-01 MED ORDER — DULOXETINE HCL 20 MG PO CPEP: 20.0000 mg | ORAL_CAPSULE | Freq: Every day | ORAL | 3 refills | Status: DC

## 2017-06-01 MED ORDER — HYDROXYZINE HCL 10 MG PO TABS: ORAL_TABLET | ORAL | 0 refills | Status: DC

## 2017-06-01 NOTE — Progress Notes (Signed)
Office cancelled patient's appointment today since she has been doing well on the Cymbalta. Patient was called and informed of the change, pharmacy was verified and refill of medication was sent to [facility] as requested. Will follow up with her as needed.

## 2017-06-07 ENCOUNTER — Other Ambulatory Visit: Payer: Self-pay | Admitting: *Deleted

## 2017-06-07 ENCOUNTER — Inpatient Hospital Stay
Admission: RE | Admit: 2017-06-07 | Discharge: 2017-06-07 | Disposition: A | Discharge status: Home / Self Care | Payer: Self-pay | Source: Ambulatory Visit | Attending: *Deleted | Admitting: *Deleted

## 2017-06-07 ENCOUNTER — Ambulatory Visit
Admission: RE | Admit: 2017-06-07 | Discharge: 2017-06-07 | Disposition: A | Discharge status: Home / Self Care | Payer: Medicare Other | Source: Ambulatory Visit | Attending: Primary Care | Admitting: Primary Care

## 2017-06-07 DIAGNOSIS — Z1239 Encounter for other screening for malignant neoplasm of breast: Secondary | ICD-10-CM

## 2017-06-07 DIAGNOSIS — Z1231 Encounter for screening mammogram for malignant neoplasm of breast: Secondary | ICD-10-CM | POA: Diagnosis not present

## 2017-06-07 DIAGNOSIS — Z9289 Personal history of other medical treatment: Secondary | ICD-10-CM

## 2017-06-07 DIAGNOSIS — R928 Other abnormal and inconclusive findings on diagnostic imaging of breast: Secondary | ICD-10-CM | POA: Diagnosis not present

## 2017-06-09 ENCOUNTER — Other Ambulatory Visit: Payer: Self-pay | Admitting: Primary Care

## 2017-06-09 DIAGNOSIS — R928 Other abnormal and inconclusive findings on diagnostic imaging of breast: Secondary | ICD-10-CM

## 2017-06-09 DIAGNOSIS — N6489 Other specified disorders of breast: Secondary | ICD-10-CM

## 2017-06-13 ENCOUNTER — Other Ambulatory Visit (INDEPENDENT_AMBULATORY_CARE_PROVIDER_SITE_OTHER): Payer: Medicare Other

## 2017-06-13 ENCOUNTER — Other Ambulatory Visit: Payer: Self-pay | Admitting: Surgery

## 2017-06-13 ENCOUNTER — Other Ambulatory Visit: Payer: Medicare Other

## 2017-06-13 DIAGNOSIS — E119 Type 2 diabetes mellitus without complications: Secondary | ICD-10-CM

## 2017-06-13 DIAGNOSIS — M19012 Primary osteoarthritis, left shoulder: Secondary | ICD-10-CM

## 2017-06-13 LAB — HEMOGLOBIN A1C: Hgb A1c MFr Bld: 7.3 % — ABNORMAL HIGH (ref 4.6–6.5)

## 2017-06-14 ENCOUNTER — Encounter: Payer: Self-pay | Admitting: Primary Care

## 2017-06-14 DIAGNOSIS — G4700 Insomnia, unspecified: Secondary | ICD-10-CM

## 2017-06-14 DIAGNOSIS — E119 Type 2 diabetes mellitus without complications: Secondary | ICD-10-CM

## 2017-06-14 MED ORDER — TRAZODONE HCL 50 MG PO TABS
50.0000 mg | ORAL_TABLET | Freq: Every evening | ORAL | 0 refills | Status: DC | PRN
Start: 2017-06-14 — End: 2018-02-15

## 2017-06-14 MED ORDER — METFORMIN HCL ER 500 MG PO TB24: 500.0000 mg | ORAL_TABLET | Freq: Every day | ORAL | 1 refills | Status: DC

## 2017-06-17 ENCOUNTER — Ambulatory Visit
Admission: RE | Admit: 2017-06-17 | Discharge: 2017-06-17 | Disposition: A | Discharge status: Home / Self Care | Payer: Medicare Other | Source: Ambulatory Visit | Attending: Primary Care | Admitting: Primary Care

## 2017-06-17 DIAGNOSIS — R928 Other abnormal and inconclusive findings on diagnostic imaging of breast: Secondary | ICD-10-CM | POA: Diagnosis not present

## 2017-06-17 DIAGNOSIS — N6489 Other specified disorders of breast: Secondary | ICD-10-CM | POA: Diagnosis not present

## 2017-06-17 DIAGNOSIS — N6001 Solitary cyst of right breast: Secondary | ICD-10-CM | POA: Diagnosis not present

## 2017-06-21 ENCOUNTER — Ambulatory Visit
Admission: RE | Admit: 2017-06-21 | Discharge: 2017-06-21 | Disposition: A | Discharge status: Home / Self Care | Payer: Medicare Other | Source: Ambulatory Visit | Attending: Surgery | Admitting: Surgery

## 2017-06-21 DIAGNOSIS — M75112 Incomplete rotator cuff tear or rupture of left shoulder, not specified as traumatic: Secondary | ICD-10-CM | POA: Insufficient documentation

## 2017-06-21 DIAGNOSIS — M19012 Primary osteoarthritis, left shoulder: Secondary | ICD-10-CM | POA: Diagnosis not present

## 2017-06-21 DIAGNOSIS — M7582 Other shoulder lesions, left shoulder: Secondary | ICD-10-CM | POA: Insufficient documentation

## 2017-06-28 ENCOUNTER — Other Ambulatory Visit: Payer: Self-pay | Admitting: Primary Care

## 2017-06-28 DIAGNOSIS — G4700 Insomnia, unspecified: Secondary | ICD-10-CM

## 2017-06-29 NOTE — Telephone Encounter (Signed)
Received My Chart message stating that this is too costly and not effective. Will refuse refill.

## 2017-06-29 NOTE — Telephone Encounter (Signed)
Ok to refill? Electronically refill request for hydrOXYzine (ATARAX/VISTARIL) 10 MG tablet  Last prescribed on 06/01/2017. Last seen on 12/09/2016

## 2017-07-08 HISTORY — PX: JOINT REPLACEMENT: SHX530

## 2017-07-11 ENCOUNTER — Other Ambulatory Visit: Payer: Medicare Other

## 2017-07-19 ENCOUNTER — Encounter
Admission: RE | Admit: 2017-07-19 | Discharge: 2017-07-19 | Disposition: A | Discharge status: Home / Self Care | Payer: Medicare Other | Source: Ambulatory Visit | Attending: Surgery | Admitting: Surgery

## 2017-07-19 ENCOUNTER — Other Ambulatory Visit: Payer: Self-pay

## 2017-07-19 DIAGNOSIS — Z01812 Encounter for preprocedural laboratory examination: Secondary | ICD-10-CM | POA: Diagnosis not present

## 2017-07-19 DIAGNOSIS — M19012 Primary osteoarthritis, left shoulder: Secondary | ICD-10-CM | POA: Diagnosis not present

## 2017-07-19 DIAGNOSIS — Z01810 Encounter for preprocedural cardiovascular examination: Secondary | ICD-10-CM | POA: Insufficient documentation

## 2017-07-19 DIAGNOSIS — R9431 Abnormal electrocardiogram [ECG] [EKG]: Secondary | ICD-10-CM | POA: Diagnosis not present

## 2017-07-19 DIAGNOSIS — I10 Essential (primary) hypertension: Secondary | ICD-10-CM | POA: Diagnosis not present

## 2017-07-19 HISTORY — DX: Type 2 diabetes mellitus without complications: E11.9

## 2017-07-19 LAB — CBC
HCT: 45.9 % (ref 35.0–47.0)
Hemoglobin: 15.7 g/dL (ref 12.0–16.0)
MCH: 31.1 pg (ref 26.0–34.0)
MCHC: 34.2 g/dL (ref 32.0–36.0)
MCV: 91.1 fL (ref 80.0–100.0)
Platelets: 326 10*3/uL (ref 150–440)
RBC: 5.03 MIL/uL (ref 3.80–5.20)
RDW: 12.8 % (ref 11.5–14.5)
WBC: 8.5 10*3/uL (ref 3.6–11.0)

## 2017-07-19 LAB — PROTIME-INR
INR: 0.95
Prothrombin Time: 12.6 seconds (ref 11.4–15.2)

## 2017-07-19 LAB — URINALYSIS, ROUTINE W REFLEX MICROSCOPIC
Bilirubin Urine: NEGATIVE
Glucose, UA: NEGATIVE mg/dL
Hgb urine dipstick: NEGATIVE
Ketones, ur: NEGATIVE mg/dL
Leukocytes, UA: NEGATIVE
Nitrite: NEGATIVE
Protein, ur: NEGATIVE mg/dL
Specific Gravity, Urine: 1.014 (ref 1.005–1.030)
pH: 5 (ref 5.0–8.0)

## 2017-07-19 LAB — BASIC METABOLIC PANEL
Anion gap: 13 (ref 5–15)
BUN: 20 mg/dL (ref 6–20)
CO2: 24 mmol/L (ref 22–32)
Calcium: 9.2 mg/dL (ref 8.9–10.3)
Chloride: 100 mmol/L — ABNORMAL LOW (ref 101–111)
Creatinine, Ser: 0.95 mg/dL (ref 0.44–1.00)
GFR calc Af Amer: >60 mL/min (ref >60)
GFR calc non Af Amer: 59 mL/min — ABNORMAL LOW (ref >60)
Glucose, Bld: 202 mg/dL — ABNORMAL HIGH (ref 65–99)
Potassium: 3.6 mmol/L (ref 3.5–5.1)
Sodium: 137 mmol/L (ref 135–145)

## 2017-07-19 LAB — TYPE AND SCREEN
ABO/RH(D): O POS
Antibody Screen: NEGATIVE

## 2017-07-19 LAB — SURGICAL PCR SCREEN
MRSA, PCR: NEGATIVE
Staphylococcus aureus: NEGATIVE

## 2017-07-19 NOTE — Patient Instructions (Addendum)
Your procedure is scheduled on: Tuesday, August 02, 2017 Report to Day Surgery on the 2nd floor of the [facility]. To find out your arrival time, please call [phone] between 1PM - 3PM on: Monday, August 01, 2017  REMEMBER: Instructions that are not followed completely may result in serious medical risk, up to and including death; or upon the discretion of your surgeon and anesthesiologist your surgery may need to be rescheduled.  Do not eat food after midnight the night before your procedure.  No gum chewing, lozengers or hard candies.  You may however, drink water up to 2 hours before you are scheduled to arrive for your surgery. Do not drink anything within 2 hours of the start of your surgery.  No Alcohol for 24 hours before or after surgery.  No Smoking including e-cigarettes for 24 hours prior to surgery.  No chewable tobacco products for at least 6 hours prior to surgery.  No nicotine patches on the day of surgery.  On the morning of surgery brush your teeth with toothpaste and water, you may rinse your mouth with mouthwash if you wish. Do not swallow any toothpaste or mouthwash.  Notify your doctor if there is any change in your medical condition (cold, fever, infection).  Do not wear jewelry, make-up, hairpins, clips or nail polish.  Do not wear lotions, powders, or perfumes.  Do not shave 48 hours prior to surgery.  Contacts and dentures may not be worn into surgery.  Do not bring valuables to the hospital, including drivers license, insurance or credit cards.  [facility] is not responsible for any belongings or valuables.   TAKE THESE MEDICATIONS THE MORNING OF SURGERY:  1.  ATENOLOL 2.  FLECAINIDE 3.  PEPCID 4.  TRAMADOL (if needed for pain)  Use CHG Soap as directed on instruction sheet.  Stop Metformin 2 days prior to surgery. LAST DAY TO TAKE IS Saturday, MARCH 23. RESUME AFTER SURGERY.  Follow recommendations from Cardiologist regarding stopping  Aspirin. USUALLY STOP 1 WEEK PRIOR TO SURGERY.  ON MARCH 19 - Stop Anti-inflammatories (NSAIDS) such as Advil, Aleve, Ibuprofen, Motrin, Naproxen, Naprosyn and Aspirin based products such as Excedrin, Goodys Powder, BC Powder. (May take Tylenol or Acetaminophen if needed.)  ON MARCH 19 - Stop ANY OVER THE COUNTER supplements until after surgery. (May continue Vitamin D and multivitamin.)  Wear comfortable clothing (specific to your surgery type) to the hospital.  Plan for stool softeners for home use.  If you are being admitted to the hospital overnight, leave your suitcase in the car. After surgery it may be brought to your room.  If you are taking public transportation, you will need to have a responsible adult with you. Please confirm with your physician that it is acceptable to use public transportation.   Please call [phone] if you have any questions about these instructions.

## 2017-07-20 LAB — URINE CULTURE: Culture: NO GROWTH

## 2017-07-27 NOTE — Progress Notes (Signed)
Electrophysiology Office Note Date: 07/29/2017  ID:  Kelsey Weaver, Kelsey Weaver 1945/07/05, MRN [id number]  PCP: Pleas Koch, NP Electrophysiologist: Rayann Heman  CC: AF follow up  Kelsey Weaver is a 72 y.o. female seen today for Dr Rayann Heman.  She presents today for routine electrophysiology followup.  Since last being seen in our clinic, the patient reports doing very well. She has not had recurrence of AF. She remains very active swimming several days per week without chest pain or shortness of breath.  She is pending shoulder replacement surgery next week. Her mom is now in a long term care facility and doing well there.  She denies chest pain, palpitations, dyspnea, PND, orthopnea, nausea, vomiting, dizziness, syncope, edema, weight gain, or early satiety.  Past Medical History:  Diagnosis Date  . Cardiac arrhythmia due to congenital heart disease   . Diabetes mellitus without complication ([place])   . Diverticulosis   . DJD (degenerative joint disease)    frozen shoulder  . 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 GERD (gastroesophageal reflux disease)   . History of colonic polyps   . Hyperlipemia   . Overweight   . Paroxysmal atrial fibrillation ([place]) 09/2000   Past Surgical History:  Procedure Laterality Date  . ABLATION  10/31/2007, 08/26/2008, 09/22/2012, 07/01/2015   AFib ablation x 4 in [place]  . CATARACT EXTRACTION Bilateral   . COLONOSCOPY    . CYSTECTOMY     subsebacous x 2  . DILATION AND CURETTAGE OF UTERUS  at age 52  . FOOT SURGERY Bilateral 2008, 2014   4 hammertoes and bunionectomy  . implantable loop recorder pacement  01/19/13   MDT Reveal LINQ implanted in [place] for afib management  . KNEE SURGERY Left 2016   arthoscopy   . LIGATION / DIVISION SAPHENOUS VEIN Bilateral   . POLYPECTOMY      Current Outpatient Medications  Medication Sig Dispense Refill  . ALPRAZolam (XANAX) 0.5 MG tablet Take 0.5 mg by mouth at bedtime as needed for sleep.     Marland Kitchen  aspirin 81 MG tablet Take 81 mg by mouth daily.    Marland Kitchen atenolol (TENORMIN) 25 MG tablet Take 1 tablet (25 mg total) by mouth daily as needed (heart rate). (Patient taking differently: Take 25 mg by mouth daily. ) 30 tablet 10  . cholecalciferol (VITAMIN D) 1000 units tablet Take 1,000 Units by mouth daily.    . DULoxetine (CYMBALTA) 20 MG capsule Take 1 capsule (20 mg total) by mouth at bedtime. 90 capsule 3  . famotidine (PEPCID AC MAXIMUM STRENGTH) 20 MG tablet Take 20 mg by mouth at bedtime.     . flecainide (TAMBOCOR) 50 MG tablet Take 1 tablet (50 mg total) by mouth 2 (two) times daily as needed (AFIB). (Patient taking differently: Take 50 mg by mouth 2 (two) times daily. ) 60 tablet 10  . Glucosamine-Chondroit-Vit C-Mn (GLUCOSAMINE 1500 COMPLEX) CAPS Take 1 capsule by mouth 2 (two) times daily.    Marland Kitchen ibuprofen (ADVIL,MOTRIN) 800 MG tablet Take 800 mg by mouth at bedtime.     . metFORMIN (GLUCOPHAGE XR) 500 MG 24 hr tablet Take 1 tablet (500 mg total) by mouth daily with breakfast. 90 tablet 1  . Multiple Vitamin (MULTIVITAMIN) tablet Take 1 tablet by mouth daily.    . Multiple Vitamins-Minerals (EYE VITAMINS & MINERALS) TABS Take 1 tablet by mouth daily. Vision Health otc supplement    . OVER THE COUNTER MEDICATION Take  1 capsule by mouth daily. MegaRed Omega-3 Krill Oil 1,000 mg-230 mg-60 mg    . pravastatin (PRAVACHOL) 20 MG tablet TAKE ONE TABLET BY MOUTH ONCE DAILY (Patient taking differently: TAKE ONE TABLET BY MOUTH ONCE DAILY AT BEDTIME) 90 tablet 3  . traMADol (ULTRAM) 50 MG tablet Take 50 mg by mouth daily as needed for moderate pain.     . traZODone (DESYREL) 50 MG tablet Take 1 tablet (50 mg total) by mouth at bedtime as needed for sleep. (Patient taking differently: Take 25-50 mg by mouth at bedtime. ) 90 tablet 0  . triamterene-hydrochlorothiazide (MAXZIDE-25) 37.5-25 MG tablet Take 0.5 tablets by mouth daily as needed ([place]).     . Turmeric 500 MG TABS Take 500 mg by mouth daily.      Marland Kitchen OVER THE COUNTER MEDICATION Take 1 capsule by mouth daily. Herbalife Thermo Bond    . OVER THE COUNTER MEDICATION Take 1 capsule by mouth 3 (three) times daily. Herbalife Total Control    . OVER THE COUNTER MEDICATION Take 1 capsule by mouth 2 (two) times daily. Herbalife Aminogen    . OVER THE COUNTER MEDICATION Take 1 capsule by mouth 2 (two) times daily. Herbalife Cell Activator    . OVER THE COUNTER MEDICATION Take 1 tablet by mouth 2 (two) times daily. Herbalife Chief Executive Officer    . OVER THE COUNTER MEDICATION Take 1 tablet by mouth 3 (three) times daily. Herbalife Cell-U-Loss     Current Facility-Administered Medications  Medication Dose Route Frequency Provider Last Rate Last Dose  . 0.9 %  sodium chloride infusion  500 mL Intravenous Continuous Milus Banister, MD        Allergies:   Ambien [zolpidem tartrate]; Compazine [prochlorperazine edisylate]; Metoprolol; and Reglan [metoclopramide]   Social History: Social History   Socioeconomic History  . Marital status: Married    Spouse name: Not on file  . Number of children: 3  . Years of education: Not on file  . Highest education level: Not on file  Occupational History  . Occupation: Retired Animal nutritionist  . Financial resource strain: Not on file  . Food insecurity:    Worry: Not on file    Inability: Not on file  . Transportation needs:    Medical: Not on file    Non-medical: Not on file  Tobacco Use  . Smoking status: Never Smoker  . Smokeless tobacco: Never Used  Substance and Sexual Activity  . Alcohol use: No    Alcohol/week: 0.0 oz  . Drug use: No  . Sexual activity: Not on file  Lifestyle  . Physical activity:    Days per week: Not on file    Minutes per session: Not on file  . Stress: Not on file  Relationships  . Social connections:    Talks on phone: Not on file    Gets together: Not on file    Attends religious service: Not on file    Active member of club or organization: Not on file     Attends meetings of clubs or organizations: Not on file    Relationship status: Not on file  . Intimate partner violence:    Fear of current or ex partner: Not on file    Emotionally abused: Not on file    Physically abused: Not on file    Forced sexual activity: Not on file  Other Topics Concern  . 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    Once worked in L&D and NICU.    Son is a Marine scientist at [employer] and also works for [employer] .   Enjoys spending time with her family.     Family History: Family History  Problem Relation Age of Onset  . Hypertension Mother   . Glaucoma Mother   . CAD Father   . Atrial fibrillation Father   . CAD Brother   . Hypertension Brother   . Gallbladder disease Sister   . Atrial fibrillation Brother   . Hypercholesterolemia Brother   . Cholelithiasis Sister   . Colon cancer Neg Hx     Review of Systems: All other systems reviewed and are otherwise negative except as noted above.   Physical Exam: VS:  BP 124/82 (BP Location: Right Arm, Patient Position: Sitting, Cuff Size: Normal)   Pulse 85   Wt 220 lb 3.2 oz (99.9 kg)   SpO2 95%   BMI 36.25 kg/m  , BMI Body mass index is 36.25 kg/m. Wt Readings from Last 3 Encounters:  07/29/17 220 lb 3.2 oz (99.9 kg)  07/19/17 223 lb (101.2 kg)  12/09/16 223 lb (101.2 kg)    GEN- The patient is well appearing, alert and oriented x 3 today.   HEENT: normocephalic, atraumatic; sclera clear, conjunctiva pink; hearing intact; oropharynx clear; neck supple  Lungs- Clear to ausculation bilaterally, normal work of breathing.  No wheezes, rales, rhonchi Heart- Regular rate and rhythm  GI- soft, non-tender, non-distended, bowel sounds present Extremities- no clubbing, cyanosis, or edema  MS- no significant deformity or atrophy Skin- warm and dry, no rash or lesion  Psych- euthymic mood, full affect Neuro- strength and sensation are  intact   EKG:  EKG is ordered today. The ekg ordered today shows sinus rhythm, rate 85, nonspecific STT changes   Recent Labs: 12/03/2016: ALT 24 07/19/2017: BUN 20; Creatinine, Ser 0.95; Hemoglobin 15.7; Platelets 326; Potassium 3.6; Sodium 137    Other studies Reviewed: Additional studies/ records that were reviewed today include: Dr Jackalyn Lombard office notes  Assessment and Plan: 1.  Paroxsymal atrial fibrillation  Doing well without recurrence of AF Continue Flecainide and Atenolol CHADS2VASC is 2 (age, female), with new guidelines, continue ASA for now  2.  Pending shoulder surgery She is able to achieve 4 METS without chest pain or shortness of breath She is at acceptable cardiac risk for surgery Ok to hold ASA as needed around procedure   Current medicines are reviewed at length with the patient today.   The patient does not have concerns regarding her medicines.  The following changes were made today:  none  Labs/ tests ordered today include: none No orders of the defined types were placed in this encounter.    Disposition:   Follow up with Dr Rayann Heman 1 year      Signed, Chanetta Marshall, NP 07/29/2017 11:03 AM   [facility] [address] [place] [address] [phone] (office) [phone] (fax)

## 2017-07-29 ENCOUNTER — Encounter: Payer: Self-pay | Admitting: Nurse Practitioner

## 2017-07-29 ENCOUNTER — Ambulatory Visit (INDEPENDENT_AMBULATORY_CARE_PROVIDER_SITE_OTHER): Payer: Medicare Other | Admitting: Nurse Practitioner

## 2017-07-29 VITALS — BP 124/82 | HR 85 | Wt 220.2 lb

## 2017-07-29 DIAGNOSIS — I480 Paroxysmal atrial fibrillation: Secondary | ICD-10-CM

## 2017-07-29 DIAGNOSIS — Z01818 Encounter for other preprocedural examination: Secondary | ICD-10-CM

## 2017-07-29 NOTE — Patient Instructions (Addendum)
Medication Instructions:   Your physician recommends that you continue on your current medications as directed. Please refer to the Current Medication list given to you today.   If you need a refill on your cardiac medications before your next appointment, please call your pharmacy.  Labwork: NONE ORDERED  TODAY    Testing/Procedures: NONE ORDERED  TODAY    Follow-Up:  Your physician wants you to follow-up in: ONE YEAR WITH ALLRED You will receive a reminder letter in the mail two months in advance. If you don't receive a letter, please call our office to schedule the follow-up appointment.      Any Other Special Instructions Will Be Listed Below (If Applicable).                                                                                                                                                   

## 2017-08-01 MED ORDER — CEFAZOLIN SODIUM-DEXTROSE 2-4 GM/100ML-% IV SOLN
2.0000 g | Freq: Once | INTRAVENOUS | Status: AC
  Administered 2017-08-02: 2 g via INTRAVENOUS

## 2017-08-01 NOTE — Pre-Procedure Instructions (Signed)
CLEARED ACCEPTABLE RISK BY CARDIOLOGY BY CARDIOLOGIST. IN Epic UNDER CHART REVIEW

## 2017-08-02 ENCOUNTER — Inpatient Hospital Stay
Admission: RE | Admit: 2017-08-02 | Discharge: 2017-08-03 | DRG: 483 | Disposition: A | Discharge status: Home Health | Payer: Medicare Other | Source: Ambulatory Visit | Attending: Surgery | Admitting: Surgery

## 2017-08-02 ENCOUNTER — Inpatient Hospital Stay: Payer: Medicare Other

## 2017-08-02 ENCOUNTER — Other Ambulatory Visit: Payer: Self-pay | Admitting: Primary Care

## 2017-08-02 ENCOUNTER — Other Ambulatory Visit: Payer: Self-pay

## 2017-08-02 ENCOUNTER — Inpatient Hospital Stay: Payer: Medicare Other | Admitting: Anesthesiology

## 2017-08-02 ENCOUNTER — Other Ambulatory Visit: Payer: Self-pay | Admitting: Internal Medicine

## 2017-08-02 ENCOUNTER — Encounter
Admission: RE | Disposition: A | Discharge status: Home Health | Payer: Self-pay | Source: Ambulatory Visit | Attending: Surgery

## 2017-08-02 DIAGNOSIS — Z96612 Presence of left artificial shoulder joint: Secondary | ICD-10-CM

## 2017-08-02 DIAGNOSIS — K219 Gastro-esophageal reflux disease without esophagitis: Secondary | ICD-10-CM | POA: Diagnosis present

## 2017-08-02 DIAGNOSIS — E119 Type 2 diabetes mellitus without complications: Secondary | ICD-10-CM | POA: Diagnosis present

## 2017-08-02 DIAGNOSIS — M19012 Primary osteoarthritis, left shoulder: Principal | ICD-10-CM | POA: Diagnosis present

## 2017-08-02 DIAGNOSIS — M75102 Unspecified rotator cuff tear or rupture of left shoulder, not specified as traumatic: Secondary | ICD-10-CM | POA: Diagnosis present

## 2017-08-02 DIAGNOSIS — M7582 Other shoulder lesions, left shoulder: Secondary | ICD-10-CM | POA: Diagnosis not present

## 2017-08-02 DIAGNOSIS — Z7984 Long term (current) use of oral hypoglycemic drugs: Secondary | ICD-10-CM | POA: Diagnosis not present

## 2017-08-02 DIAGNOSIS — E785 Hyperlipidemia, unspecified: Secondary | ICD-10-CM | POA: Diagnosis present

## 2017-08-02 DIAGNOSIS — I10 Essential (primary) hypertension: Secondary | ICD-10-CM | POA: Diagnosis present

## 2017-08-02 DIAGNOSIS — G8918 Other acute postprocedural pain: Secondary | ICD-10-CM | POA: Diagnosis not present

## 2017-08-02 DIAGNOSIS — Z471 Aftercare following joint replacement surgery: Secondary | ICD-10-CM | POA: Diagnosis not present

## 2017-08-02 DIAGNOSIS — I4891 Unspecified atrial fibrillation: Secondary | ICD-10-CM | POA: Diagnosis not present

## 2017-08-02 DIAGNOSIS — M25512 Pain in left shoulder: Secondary | ICD-10-CM | POA: Diagnosis not present

## 2017-08-02 HISTORY — DX: Presence of left artificial shoulder joint: Z96.612

## 2017-08-02 HISTORY — PX: REVERSE SHOULDER ARTHROPLASTY: SHX5054

## 2017-08-02 LAB — GLUCOSE, CAPILLARY
Glucose-Capillary: 137 mg/dL — ABNORMAL HIGH (ref 65–99)
Glucose-Capillary: 135 mg/dL — ABNORMAL HIGH (ref 65–99)
Glucose-Capillary: 161 mg/dL — ABNORMAL HIGH (ref 65–99)
Glucose-Capillary: 175 mg/dL — ABNORMAL HIGH (ref 65–99)

## 2017-08-02 LAB — ABO/RH: ABO/RH(D): O POS

## 2017-08-02 SURGERY — ARTHROPLASTY, SHOULDER, TOTAL, REVERSE
Anesthesia: General | Laterality: Left | Wound class: Clean

## 2017-08-02 MED ORDER — BUPIVACAINE-EPINEPHRINE (PF) 0.5% -1:200000 IJ SOLN
INTRAMUSCULAR | Status: AC
  Filled 2017-08-02: qty 30

## 2017-08-02 MED ORDER — METOCLOPRAMIDE HCL 10 MG PO TABS
5.0000 mg | ORAL_TABLET | Freq: Three times a day (TID) | ORAL | Status: DC | PRN
Start: 2017-08-02 — End: 2017-08-03

## 2017-08-02 MED ORDER — SUGAMMADEX SODIUM 200 MG/2ML IV SOLN
INTRAVENOUS | Status: DC | PRN
  Administered 2017-08-02: 200 mg via INTRAVENOUS

## 2017-08-02 MED ORDER — ALPRAZOLAM 0.5 MG PO TABS
0.5000 mg | ORAL_TABLET | Freq: Every evening | ORAL | Status: DC | PRN
  Administered 2017-08-02: 0.5 mg via ORAL
  Filled 2017-08-02: qty 1

## 2017-08-02 MED ORDER — KETOROLAC TROMETHAMINE 15 MG/ML IJ SOLN
15.0000 mg | Freq: Once | INTRAMUSCULAR | Status: AC
  Administered 2017-08-02: 15 mg via INTRAVENOUS

## 2017-08-02 MED ORDER — CEFAZOLIN SODIUM-DEXTROSE 2-4 GM/100ML-% IV SOLN
INTRAVENOUS | Status: AC
  Filled 2017-08-02: qty 100

## 2017-08-02 MED ORDER — MORPHINE SULFATE (PF) 2 MG/ML IV SOLN: 0.5000 mg | INTRAVENOUS | Status: DC | PRN

## 2017-08-02 MED ORDER — BUPIVACAINE HCL (PF) 0.5 % IJ SOLN
INTRAMUSCULAR | Status: AC
  Filled 2017-08-02: qty 10

## 2017-08-02 MED ORDER — DULOXETINE HCL 20 MG PO CPEP
20.0000 mg | ORAL_CAPSULE | Freq: Every day | ORAL | Status: DC
  Administered 2017-08-02: 20 mg via ORAL
  Filled 2017-08-02 (×2): qty 1

## 2017-08-02 MED ORDER — DEXAMETHASONE SODIUM PHOSPHATE 4 MG/ML IJ SOLN
INTRAMUSCULAR | Status: DC | PRN
  Administered 2017-08-02: 5 mg via INTRAVENOUS

## 2017-08-02 MED ORDER — ASPIRIN EC 81 MG PO TBEC
81.0000 mg | DELAYED_RELEASE_TABLET | Freq: Every day | ORAL | Status: DC
  Administered 2017-08-03: 81 mg via ORAL
  Filled 2017-08-02: qty 1

## 2017-08-02 MED ORDER — KETOROLAC TROMETHAMINE 15 MG/ML IJ SOLN
INTRAMUSCULAR | Status: AC
  Administered 2017-08-03: 15 mg
  Filled 2017-08-02: qty 1

## 2017-08-02 MED ORDER — FLEET ENEMA 7-19 GM/118ML RE ENEM: 1.0000 | ENEMA | Freq: Once | RECTAL | Status: DC | PRN

## 2017-08-02 MED ORDER — ROCURONIUM BROMIDE 100 MG/10ML IV SOLN
INTRAVENOUS | Status: DC | PRN
  Administered 2017-08-02: 80 mg via INTRAVENOUS
  Administered 2017-08-02: 10 mg via INTRAVENOUS

## 2017-08-02 MED ORDER — BUPIVACAINE LIPOSOME 1.3 % IJ SUSP
INTRAMUSCULAR | Status: AC
  Filled 2017-08-02: qty 20

## 2017-08-02 MED ORDER — HYDROCODONE-ACETAMINOPHEN 5-325 MG PO TABS: 1.0000 | ORAL_TABLET | ORAL | Status: DC | PRN

## 2017-08-02 MED ORDER — ADULT MULTIVITAMIN W/MINERALS CH
1.0000 | ORAL_TABLET | Freq: Every day | ORAL | Status: DC
  Administered 2017-08-03: 1 via ORAL
  Filled 2017-08-02: qty 1

## 2017-08-02 MED ORDER — PRAVASTATIN SODIUM 20 MG PO TABS
20.0000 mg | ORAL_TABLET | Freq: Every day | ORAL | Status: DC
  Administered 2017-08-02 – 2017-08-03 (×2): 20 mg via ORAL
  Filled 2017-08-02 (×2): qty 1

## 2017-08-02 MED ORDER — TRANEXAMIC ACID 1000 MG/10ML IV SOLN
INTRAVENOUS | Status: DC | PRN
  Administered 2017-08-02: 1000 mg

## 2017-08-02 MED ORDER — OCUVITE-LUTEIN PO CAPS
1.0000 | ORAL_CAPSULE | Freq: Every day | ORAL | Status: DC
  Filled 2017-08-02 (×2): qty 1

## 2017-08-02 MED ORDER — ATENOLOL 25 MG PO TABS
25.0000 mg | ORAL_TABLET | Freq: Every day | ORAL | Status: DC
  Administered 2017-08-03: 25 mg via ORAL
  Filled 2017-08-02: qty 1

## 2017-08-02 MED ORDER — ENOXAPARIN SODIUM 40 MG/0.4ML ~~LOC~~ SOLN
40.0000 mg | SUBCUTANEOUS | Status: DC
  Administered 2017-08-03: 40 mg via SUBCUTANEOUS
  Filled 2017-08-02: qty 0.4

## 2017-08-02 MED ORDER — FENTANYL CITRATE (PF) 100 MCG/2ML IJ SOLN
50.0000 ug | Freq: Once | INTRAMUSCULAR | Status: AC
  Administered 2017-08-02: 50 ug via INTRAVENOUS

## 2017-08-02 MED ORDER — PANTOPRAZOLE SODIUM 40 MG PO TBEC
40.0000 mg | DELAYED_RELEASE_TABLET | Freq: Every day | ORAL | Status: DC
  Administered 2017-08-03: 40 mg via ORAL
  Filled 2017-08-02: qty 1

## 2017-08-02 MED ORDER — TRAMADOL HCL 50 MG PO TABS: 50.0000 mg | ORAL_TABLET | Freq: Four times a day (QID) | ORAL | Status: DC | PRN

## 2017-08-02 MED ORDER — SUGAMMADEX SODIUM 200 MG/2ML IV SOLN
INTRAVENOUS | Status: AC
  Filled 2017-08-02: qty 2

## 2017-08-02 MED ORDER — BISACODYL 10 MG RE SUPP: 10.0000 mg | Freq: Every day | RECTAL | Status: DC | PRN

## 2017-08-02 MED ORDER — EPINEPHRINE PF 1 MG/ML IJ SOLN
INTRAMUSCULAR | Status: AC
  Filled 2017-08-02: qty 1

## 2017-08-02 MED ORDER — BUPIVACAINE LIPOSOME 1.3 % IJ SUSP
INTRAMUSCULAR | Status: DC | PRN
  Administered 2017-08-02: 20 mL via PERINEURAL

## 2017-08-02 MED ORDER — ONDANSETRON HCL 4 MG PO TABS: 4.0000 mg | ORAL_TABLET | Freq: Four times a day (QID) | ORAL | Status: DC | PRN

## 2017-08-02 MED ORDER — PROPOFOL 10 MG/ML IV BOLUS
INTRAVENOUS | Status: AC
  Filled 2017-08-02: qty 20

## 2017-08-02 MED ORDER — CEFAZOLIN SODIUM-DEXTROSE 2-4 GM/100ML-% IV SOLN
2.0000 g | Freq: Four times a day (QID) | INTRAVENOUS | Status: AC
  Administered 2017-08-02 – 2017-08-03 (×3): 2 g via INTRAVENOUS
  Filled 2017-08-02 (×4): qty 100

## 2017-08-02 MED ORDER — SODIUM CHLORIDE 0.9 % IV SOLN
INTRAVENOUS | Status: DC
  Administered 2017-08-02: 10:00:00 via INTRAVENOUS

## 2017-08-02 MED ORDER — NEOMYCIN-POLYMYXIN B GU 40-200000 IR SOLN
Status: DC | PRN
  Administered 2017-08-02: 12 mL

## 2017-08-02 MED ORDER — BUPIVACAINE-EPINEPHRINE (PF) 0.5% -1:200000 IJ SOLN
INTRAMUSCULAR | Status: DC | PRN
  Administered 2017-08-02: 30 mL via PERINEURAL

## 2017-08-02 MED ORDER — INSULIN ASPART 100 UNIT/ML ~~LOC~~ SOLN
0.0000 [IU] | Freq: Three times a day (TID) | SUBCUTANEOUS | Status: DC
  Administered 2017-08-02 – 2017-08-03 (×2): 3 [IU] via SUBCUTANEOUS
  Filled 2017-08-02 (×2): qty 1

## 2017-08-02 MED ORDER — DOCUSATE SODIUM 100 MG PO CAPS
100.0000 mg | ORAL_CAPSULE | Freq: Two times a day (BID) | ORAL | Status: DC
  Administered 2017-08-02 – 2017-08-03 (×2): 100 mg via ORAL
  Filled 2017-08-02 (×3): qty 1

## 2017-08-02 MED ORDER — ACETAMINOPHEN 325 MG PO TABS: 325.0000 mg | ORAL_TABLET | Freq: Four times a day (QID) | ORAL | Status: DC | PRN

## 2017-08-02 MED ORDER — FLECAINIDE ACETATE 50 MG PO TABS
50.0000 mg | ORAL_TABLET | Freq: Two times a day (BID) | ORAL | Status: DC
  Administered 2017-08-02 – 2017-08-03 (×2): 50 mg via ORAL
  Filled 2017-08-02 (×3): qty 1

## 2017-08-02 MED ORDER — FENTANYL CITRATE (PF) 100 MCG/2ML IJ SOLN
INTRAMUSCULAR | Status: AC
  Administered 2017-08-02: 50 ug via INTRAVENOUS
  Filled 2017-08-02: qty 2

## 2017-08-02 MED ORDER — KETOROLAC TROMETHAMINE 15 MG/ML IJ SOLN
7.5000 mg | Freq: Four times a day (QID) | INTRAMUSCULAR | Status: DC
  Administered 2017-08-03 (×2): 7.5 mg via INTRAVENOUS
  Filled 2017-08-02 (×3): qty 1

## 2017-08-02 MED ORDER — DEXAMETHASONE SODIUM PHOSPHATE 10 MG/ML IJ SOLN
INTRAMUSCULAR | Status: AC
  Filled 2017-08-02: qty 1

## 2017-08-02 MED ORDER — MIDAZOLAM HCL 2 MG/2ML IJ SOLN
INTRAMUSCULAR | Status: AC
  Administered 2017-08-02: 1 mg via INTRAVENOUS
  Filled 2017-08-02: qty 2

## 2017-08-02 MED ORDER — VITAMIN D 1000 UNITS PO TABS
1000.0000 [IU] | ORAL_TABLET | Freq: Every day | ORAL | Status: DC
  Administered 2017-08-03: 1000 [IU] via ORAL
  Filled 2017-08-02: qty 1

## 2017-08-02 MED ORDER — TRAZODONE HCL 50 MG PO TABS
50.0000 mg | ORAL_TABLET | Freq: Every evening | ORAL | Status: DC | PRN
  Administered 2017-08-02: 50 mg via ORAL
  Filled 2017-08-02: qty 1

## 2017-08-02 MED ORDER — FENTANYL CITRATE (PF) 250 MCG/5ML IJ SOLN
INTRAMUSCULAR | Status: AC
  Filled 2017-08-02: qty 5

## 2017-08-02 MED ORDER — METOCLOPRAMIDE HCL 5 MG/ML IJ SOLN: 5.0000 mg | Freq: Three times a day (TID) | INTRAMUSCULAR | Status: DC | PRN

## 2017-08-02 MED ORDER — GLUCOSAMINE 1500 COMPLEX PO CAPS: 1.0000 | ORAL_CAPSULE | Freq: Two times a day (BID) | ORAL | Status: DC

## 2017-08-02 MED ORDER — MIDAZOLAM HCL 2 MG/2ML IJ SOLN
1.0000 mg | Freq: Once | INTRAMUSCULAR | Status: AC
  Administered 2017-08-02: 1 mg via INTRAVENOUS

## 2017-08-02 MED ORDER — TURMERIC 500 MG PO TABS: 500.0000 mg | ORAL_TABLET | Freq: Every day | ORAL | Status: DC

## 2017-08-02 MED ORDER — DIPHENHYDRAMINE HCL 12.5 MG/5ML PO ELIX: 12.5000 mg | ORAL_SOLUTION | ORAL | Status: DC | PRN

## 2017-08-02 MED ORDER — POTASSIUM CHLORIDE IN NACL 20-0.9 MEQ/L-% IV SOLN
INTRAVENOUS | Status: DC
  Administered 2017-08-02 – 2017-08-03 (×2): via INTRAVENOUS
  Filled 2017-08-02 (×4): qty 1000

## 2017-08-02 MED ORDER — TRANEXAMIC ACID 1000 MG/10ML IV SOLN
INTRAVENOUS | Status: AC
  Filled 2017-08-02: qty 10

## 2017-08-02 MED ORDER — LIDOCAINE HCL (CARDIAC) 20 MG/ML IV SOLN
INTRAVENOUS | Status: DC | PRN
  Administered 2017-08-02 (×2): 100 mg via INTRAVENOUS

## 2017-08-02 MED ORDER — ONDANSETRON HCL 4 MG/2ML IJ SOLN: 4.0000 mg | Freq: Once | INTRAMUSCULAR | Status: DC | PRN

## 2017-08-02 MED ORDER — LIDOCAINE HCL (PF) 1 % IJ SOLN
INTRAMUSCULAR | Status: AC
  Filled 2017-08-02: qty 5

## 2017-08-02 MED ORDER — MAGNESIUM HYDROXIDE 400 MG/5ML PO SUSP: 30.0000 mL | Freq: Every day | ORAL | Status: DC | PRN

## 2017-08-02 MED ORDER — FENTANYL CITRATE (PF) 100 MCG/2ML IJ SOLN
25.0000 ug | INTRAMUSCULAR | Status: DC | PRN
Start: 2017-08-02 — End: 2017-08-02

## 2017-08-02 MED ORDER — ONDANSETRON HCL 4 MG/2ML IJ SOLN
INTRAMUSCULAR | Status: DC | PRN
  Administered 2017-08-02: 4 mg via INTRAVENOUS

## 2017-08-02 MED ORDER — TRIAMTERENE-HCTZ 37.5-25 MG PO TABS
0.5000 | ORAL_TABLET | Freq: Every day | ORAL | Status: DC | PRN
  Filled 2017-08-02: qty 0.5

## 2017-08-02 MED ORDER — LIDOCAINE HCL (PF) 2 % IJ SOLN
INTRAMUSCULAR | Status: AC
  Filled 2017-08-02: qty 10

## 2017-08-02 MED ORDER — ROCURONIUM BROMIDE 50 MG/5ML IV SOLN
INTRAVENOUS | Status: AC
  Filled 2017-08-02: qty 2

## 2017-08-02 MED ORDER — ONDANSETRON HCL 4 MG/2ML IJ SOLN: 4.0000 mg | Freq: Four times a day (QID) | INTRAMUSCULAR | Status: DC | PRN

## 2017-08-02 MED ORDER — FENTANYL CITRATE (PF) 100 MCG/2ML IJ SOLN
INTRAMUSCULAR | Status: DC | PRN
  Administered 2017-08-02: 25 ug via INTRAVENOUS
  Administered 2017-08-02 (×2): 50 ug via INTRAVENOUS
  Administered 2017-08-02: 25 ug via INTRAVENOUS

## 2017-08-02 MED ORDER — LIDOCAINE HCL (PF) 1 % IJ SOLN
INTRAMUSCULAR | Status: DC | PRN
  Administered 2017-08-02: .8 mL via SUBCUTANEOUS

## 2017-08-02 MED ORDER — PROPOFOL 10 MG/ML IV BOLUS
INTRAVENOUS | Status: DC | PRN
  Administered 2017-08-02: 200 mg via INTRAVENOUS

## 2017-08-02 MED ORDER — BUPIVACAINE HCL (PF) 0.5 % IJ SOLN
INTRAMUSCULAR | Status: DC | PRN
  Administered 2017-08-02: 10 mL via PERINEURAL

## 2017-08-02 MED ORDER — ONDANSETRON HCL 4 MG/2ML IJ SOLN
INTRAMUSCULAR | Status: AC
Start: 2017-08-02 — End: 2017-08-02
  Filled 2017-08-02: qty 2

## 2017-08-02 MED ORDER — NEOMYCIN-POLYMYXIN B GU 40-200000 IR SOLN
Status: AC
  Filled 2017-08-02: qty 20

## 2017-08-02 MED ORDER — LACTATED RINGERS IV SOLN
INTRAVENOUS | Status: DC | PRN
  Administered 2017-08-02: 13:00:00 via INTRAVENOUS

## 2017-08-02 MED ORDER — METFORMIN HCL ER 500 MG PO TB24
500.0000 mg | ORAL_TABLET | Freq: Every day | ORAL | Status: DC
  Administered 2017-08-03: 500 mg via ORAL
  Filled 2017-08-02: qty 1

## 2017-08-02 SURGICAL SUPPLY — 70 items
BAG DECANTER FOR FLEXI CONT (MISCELLANEOUS) ×3 IMPLANT
BASEPLATE GLENOSPHERE 25 (Plate) ×4 IMPLANT
BASEPLATE GLENOSPHERE 25MM (Plate) ×2 IMPLANT
BEARING HUIMERAL E1 44X36 STD (Miscellaneous) ×1 IMPLANT
BIT DRILL TWIST 2.7 (BIT) ×2 IMPLANT
BIT DRILL TWIST 2.7MM (BIT) ×1
BLADE SAGITTAL WIDE XTHICK NO (BLADE) ×3 IMPLANT
BOWL CEMENT MIX W/ADAPTER (MISCELLANEOUS) ×3 IMPLANT
CANISTER SUCT 1200ML W/VALVE (MISCELLANEOUS) ×3 IMPLANT
CANISTER SUCT 3000ML PPV (MISCELLANEOUS) ×6 IMPLANT
CHLORAPREP W/TINT 26ML (MISCELLANEOUS) ×3 IMPLANT
COOLER POLAR GLACIER W/PUMP (MISCELLANEOUS) ×3 IMPLANT
DRAPE IMP U-DRAPE 54X76 (DRAPES) ×6 IMPLANT
DRAPE INCISE IOBAN 66X45 STRL (DRAPES) ×6 IMPLANT
DRAPE SHEET LG 3/4 BI-LAMINATE (DRAPES) ×6 IMPLANT
DRAPE TABLE BACK 80X90 (DRAPES) ×3 IMPLANT
DRSG OPSITE POSTOP 4X8 (GAUZE/BANDAGES/DRESSINGS) ×3 IMPLANT
ELECT BLADE 6.5 EXT (BLADE) ×3 IMPLANT
ELECT CAUTERY BLADE 6.4 (BLADE) ×3 IMPLANT
GAUZE PACK 2X3YD (MISCELLANEOUS) ×3 IMPLANT
GLENOID SPHERE 36MM CVD +3 (Orthopedic Implant) IMPLANT
GLENOID SPHERE STD STRL 36MM (Orthopedic Implant) ×3 IMPLANT
GLOVE BIO SURGEON STRL SZ7.5 (GLOVE) ×12 IMPLANT
GLOVE BIO SURGEON STRL SZ8 (GLOVE) ×12 IMPLANT
GLOVE BIOGEL PI IND STRL 8 (GLOVE) ×1 IMPLANT
GLOVE BIOGEL PI INDICATOR 8 (GLOVE) ×2
GLOVE INDICATOR 8.0 STRL GRN (GLOVE) ×3 IMPLANT
GOWN STRL REUS W/ TWL LRG LVL3 (GOWN DISPOSABLE) ×1 IMPLANT
GOWN STRL REUS W/ TWL XL LVL3 (GOWN DISPOSABLE) ×1 IMPLANT
GOWN STRL REUS W/TWL LRG LVL3 (GOWN DISPOSABLE) ×2
GOWN STRL REUS W/TWL XL LVL3 (GOWN DISPOSABLE) ×2
HEAD HUMERAL COMP STD (Orthopedic Implant) IMPLANT
HOOD PEEL AWAY FLYTE STAYCOOL (MISCELLANEOUS) ×9 IMPLANT
HUMERAL BEARING E1 44X36 STD (Miscellaneous) ×3 IMPLANT
HUMERAL HEAD COMP STD (Orthopedic Implant) IMPLANT
KIT STABILIZATION SHOULDER (MISCELLANEOUS) ×3 IMPLANT
MASK FACE SPIDER DISP (MASK) ×3 IMPLANT
NDL SAFETY ECLIPSE 18X1.5 (NEEDLE) ×1 IMPLANT
NEEDLE HYPO 18GX1.5 SHARP (NEEDLE) ×2
NEEDLE SPNL 20GX3.5 QUINCKE YW (NEEDLE) ×3 IMPLANT
NS IRRIG 500ML POUR BTL (IV SOLUTION) ×3 IMPLANT
PACK ARTHROSCOPY SHOULDER (MISCELLANEOUS) ×3 IMPLANT
PAD WRAPON POLAR SHDR UNIV (MISCELLANEOUS) ×1 IMPLANT
PIN THREADED REVERSE (PIN) ×3 IMPLANT
PULSAVAC PLUS IRRIG FAN TIP (DISPOSABLE) ×3
SCREW BONE LOCKING 4.75X30X3.5 (Screw) ×3 IMPLANT
SCREW BONE STRL 6.5MMX25MM (Screw) ×3 IMPLANT
SCREW LOCKING 4.75MMX15MM (Screw) ×3 IMPLANT
SCREW LOCKING STRL 4.75X25X3.5 (Screw) ×3 IMPLANT
SCREW NON-LOCK 4.75MMX15MM (Screw) ×3 IMPLANT
SOL .9 NS 3000ML IRR  AL (IV SOLUTION) ×2
SOL .9 NS 3000ML IRR UROMATIC (IV SOLUTION) ×1 IMPLANT
SPONGE LAP 18X18 5 PK (GAUZE/BANDAGES/DRESSINGS) ×3 IMPLANT
STAPLER SKIN PROX 35W (STAPLE) ×3 IMPLANT
STEM HUMERAL STRL 13MMX55MM (Stem) ×3 IMPLANT
STRAP SAFETY 5IN WIDE (MISCELLANEOUS) ×3 IMPLANT
SUT ETHIBOND 0 MO6 C/R (SUTURE) ×3 IMPLANT
SUT FIBERWIRE #2 38 BLUE 1/2 (SUTURE) ×6
SUT VIC AB 0 CT1 36 (SUTURE) ×6 IMPLANT
SUT VIC AB 2-0 CT1 27 (SUTURE) ×4
SUT VIC AB 2-0 CT1 TAPERPNT 27 (SUTURE) ×2 IMPLANT
SUTURE FIBERWR #2 38 BLUE 1/2 (SUTURE) ×2 IMPLANT
SYR 10ML LL (SYRINGE) ×3 IMPLANT
SYR 30ML LL (SYRINGE) ×6 IMPLANT
SYRINGE IRR TOOMEY STRL 70CC (SYRINGE) ×3 IMPLANT
TAPE TRANSPORE STRL 2 31045 (GAUZE/BANDAGES/DRESSINGS) ×3 IMPLANT
TIP FAN IRRIG PULSAVAC PLUS (DISPOSABLE) ×1 IMPLANT
TRAY FOLEY W/METER SILVER 16FR (SET/KITS/TRAYS/PACK) ×3 IMPLANT
TRAY HUM STD 44MM (Orthopedic Implant) ×3 IMPLANT
WRAPON POLAR PAD SHDR UNIV (MISCELLANEOUS) ×3

## 2017-08-02 NOTE — Evaluation (Signed)
Physical Therapy Evaluation Patient Details Name: Kelsey Weaver MRN: [id number] DOB: December 13, 1945 Today's Date: 08/02/2017   History of Present Illness  Pt is a 72 y.o. 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  PMH includes htn and a-fib.  Clinical Impression  Pt seen on POD#0.  Prior to hospital admission, pt was independent.  Pt lives with her husband on main level of home with 1 small step to enter.  Currently pt is min to mod assist supine to sit; CGA with transfers; and CGA ambulating 2 laps around nursing station no AD (pt requesting to walk 2nd lap and reporting walking felt good).  HR WFL during session; O2 sats 91% on room air post ambulation but quickly returned to 94% with sitting rest.  Pt would benefit from skilled PT to address noted impairments and functional limitations (see below for any additional details).  Upon hospital discharge, recommend pt discharge to home with OP PT.    Follow Up Recommendations Outpatient PT    Equipment Recommendations  None recommended by PT    Recommendations for Other Services OT consult     Precautions / Restrictions Precautions Precautions: Shoulder;Fall Shoulder Interventions: Shoulder sling/immobilizer;Shoulder abduction pillow Precaution Booklet Issued: No Restrictions Weight Bearing Restrictions: Yes LUE Weight Bearing: Non weight bearing      Mobility  Bed Mobility Overal bed mobility: Needs Assistance Bed Mobility: Supine to Sit     Supine to sit: Min assist;Mod assist;HOB elevated     General bed mobility comments: assist for trunk supine to sit (pt able to manage LE's independently)  Transfers Overall transfer level: Needs assistance Equipment used: None Transfers: Sit to/from [facility] Sit to Stand: Min guard Stand pivot transfers: Min guard(bed to recliner)       General transfer comment: increased effort to stand  initially from bed and requiring vc's for technique; no vc's required standing from recliner  Ambulation/Gait Ambulation/Gait assistance: Min guard Ambulation Distance (Feet): 400 Feet Assistive device: None   Gait velocity: mildly decreased   General Gait Details: initially decreased B step length with increased B lateral sway but improved step length and decreased B lateral sway noted with distance ambulated  Stairs            Wheelchair Mobility    Modified Rankin (Stroke Patients Only)       Balance Overall balance assessment: Needs assistance Sitting-balance support: No upper extremity supported;Feet supported Sitting balance-Leahy Scale: Good Sitting balance - Comments: steady sitting reaching within BOS with R UE   Standing balance support: No upper extremity supported;During functional activity Standing balance-Leahy Scale: Good Standing balance comment: good balance with ambulation no UE support                             Pertinent Vitals/Pain Pain Assessment: No/denies pain    Home Living Family/patient expects to be discharged to:: Private residence Living Arrangements: Spouse/significant other Available Help at Discharge: Family Type of Home: House Home Access: Stairs to enter Entrance Stairs-Rails: None Entrance Stairs-Number of Steps: 1 small 4-5 inch step Home Layout: Two level;Able to live on main level with bedroom/bathroom Home Equipment: None      Prior Function Level of Independence: Independent         Comments: Pt reports no h/o recent falls.     Hand Dominance        Extremity/Trunk Assessment   Upper  Extremity Assessment Upper Extremity Assessment: Defer to OT evaluation(L UE in shoulder sling/immobilizer; R UE WFL)    Lower Extremity Assessment Lower Extremity Assessment: Overall WFL for tasks assessed    Cervical / Trunk Assessment Cervical / Trunk Assessment: Normal  Communication   Communication: No  difficulties  Cognition Arousal/Alertness: Awake/alert Behavior During Therapy: WFL for tasks assessed/performed Overall Cognitive Status: Within Functional Limits for tasks assessed                                        General Comments General comments (skin integrity, edema, etc.): Pt resting in bed upon PT arrival; polar care in place and L UE shoulder sling/abduction pillow in place.  Washcloth placed between pt's skin and polar care top/back of shoulder d/t polar care touching pt's skin upon PT examination.    Exercises     Assessment/Plan    PT Assessment Patient needs continued PT services  PT Problem List Decreased mobility;Pain;Decreased knowledge of precautions       PT Treatment Interventions DME instruction;Gait training;Stair training;Functional mobility training;Therapeutic activities;Therapeutic exercise;Balance training;Patient/family education    PT Goals (Current goals can be found in the Care Plan section)  Acute Rehab PT Goals Patient Stated Goal: to go home PT Goal Formulation: With patient Time For Goal Achievement: 08/16/17 Potential to Achieve Goals: Good    Frequency BID   Barriers to discharge        Co-evaluation               AM-PAC PT "6 Clicks" Daily Activity  Outcome Measure Difficulty turning over in bed (including adjusting bedclothes, sheets and blankets)?: Unable Difficulty moving from lying on back to sitting on the side of the bed? : Unable Difficulty sitting down on and standing up from a chair with arms (e.g., wheelchair, bedside commode, etc,.)?: Unable Help needed moving to and from a bed to chair (including a wheelchair)?: A Little Help needed walking in hospital room?: A Little Help needed climbing 3-5 steps with a railing? : A Little 6 Click Score: 12    End of Session Equipment Utilized During Treatment: Gait belt Activity Tolerance: Patient tolerated treatment well Patient left: in chair;with call  bell/phone within reach;with chair alarm set;with family/visitor present;Other (comment)(no SCD's in room (nursing notified)) Nurse Communication: Mobility status;Precautions;Weight bearing status PT Visit Diagnosis: Other abnormalities of gait and mobility (R26.89);Pain Pain - Right/Left: Left Pain - part of body: Shoulder    Time: 9323-5573 PT Time Calculation (min) (ACUTE ONLY): 30 min   Charges:   PT Evaluation $PT Eval Low Complexity: 1 Low PT Treatments $Therapeutic Activity: 8-22 mins   PT G CodesLeitha Weaver, PT 08/02/17, 5:21 PM [phone]

## 2017-08-02 NOTE — Anesthesia Procedure Notes (Signed)
Anesthesia Regional Block: Interscalene brachial plexus block   Pre-Anesthetic Checklist: ,, timeout performed, Correct Patient, Correct Site, Correct Laterality, Correct Procedure, Correct Position, site marked, Risks and benefits discussed,  Surgical consent,  Pre-op evaluation,  At surgeon's request and post-op pain management  Laterality: Left  Prep: chloraprep       Needles:  Injection technique: Single-shot  Needle Type: Echogenic Stimulator Needle     Needle Length: 9cm  Needle Gauge: 21     Additional Needles:   Procedures:, nerve stimulator,,, ultrasound used (permanent image in chart),,,,   Nerve Stimulator or Paresthesia:  Response: biceps flexion, 0.8 mA,   Additional Responses:   Narrative:  Start time: 08/02/2017 10:06 AM End time: 08/02/2017 10:15 AM Injection made incrementally with aspirations every 5 mL.  Performed by: Personally   Additional Notes: Functioning IV was confirmed and monitors were applied.  A 1mm 22ga Stimuplex needle was used. Sterile prep and drape,hand hygiene and sterile gloves were used.  Negative aspiration and negative test dose prior to incremental administration of local anesthetic. The patient tolerated the procedure well.

## 2017-08-02 NOTE — Anesthesia Post-op Follow-up Note (Signed)
Anesthesia QCDR form completed.        

## 2017-08-02 NOTE — Progress Notes (Signed)
PHARMACIST - PHYSICIAN ORDER COMMUNICATION  CONCERNING: P&T Medication Policy on Herbal Medications  DESCRIPTION:  This patient's order for:  Tumeric and Glucosamine  has been noted.  This product(s) is classified as an "herbal" or natural product. Due to a lack of definitive safety studies or FDA approval, nonstandard manufacturing practices, plus the potential risk of unknown drug-drug interactions while on inpatient medications, the Pharmacy and Therapeutics Committee does not permit the use of "herbal" or natural products of this type within [facility].   ACTION TAKEN: The pharmacy department is unable to verify this order at this time. Please reevaluate patient's clinical condition at discharge and address if the herbal or natural product(s) should be resumed at that time.

## 2017-08-02 NOTE — Anesthesia Postprocedure Evaluation (Signed)
Anesthesia Post Note  Patient: Radio broadcast assistant  Procedure(s) Performed: TOTAL SHOULDER ARTHROPLASTY VS. REVERSE (Left )  Patient location during evaluation: PACU Anesthesia Type: General Level of consciousness: awake and alert Pain management: pain level controlled Vital Signs Assessment: post-procedure vital signs reviewed and stable Respiratory status: spontaneous breathing, nonlabored ventilation, respiratory function stable and patient connected to nasal cannula oxygen Cardiovascular status: blood pressure returned to baseline and stable Postop Assessment: no apparent nausea or vomiting Anesthetic complications: no     Last Vitals:  Vitals:   08/02/17 1425 08/02/17 1427  BP: (!) 123/58   Pulse: 76 76  Resp: 19 18  Temp:    SpO2: 95% 91%    Last Pain:  Vitals:   08/02/17 1410  TempSrc:   PainSc: 0-No pain                 Kelsey Weaver

## 2017-08-02 NOTE — Op Note (Signed)
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  Post-Op Diagnosis:   Same  Procedure:   Reverse left total shoulder arthroplasty.  Surgeon:   Pascal Lux, MD  Assistant:   Cameron Proud, PA-C  Anesthesia:   General endotracheal with an interscalene block placed preoperatively by the anesthesiologist.  Findings:   As above.  Complications:   None  EBL:   300 cc  Fluids:   1400 cc crystalloid  UOP:   None  TT:   None  Drains:   None  Closure:   Staples  Implants:   All press-fit Bi[employer] Comprehensive system with a #13 micro-humeral stem, a 44 mm humeral tray with a standard insert, and a mini-base plate with a 36 mm glenosphere.  Brief Clinical Note:   The patient is a 72 year old female with a long history of gradually worsening left shoulder pain. Her symptoms have progressed despite medications, activity modification, etc. Her history and examination consistent with advanced degenerative joint disease confirmed by plain radiographs. MRI scan demonstrated moderate tendinopathy with intrinsic tears of the supraspinatus tendon. The patient presents at this time for a reverse left total shoulder arthroplasty.  Procedure:   The patient underwent placement of an interscalene block by the anesthesiologist in the preoperative holding area before being brought into the operating room and lain in the supine position. The patient then underwent general endotracheal intubation and anesthesia before the patient was repositioned in the beach chair position using the beach chair positioner. The left shoulder and upper extremity were prepped with ChloraPrep solution before being draped sterilely. Preoperative antibiotics were administered. A standard anterior approach to the shoulder was made through an approximately 4-5 inch incision. The incision was carried down through the subcutaneous  tissues to expose the deltopectoral fascia. The interval between the deltoid and pectoralis muscles was identified and this plane developed, retracting the cephalic vein laterally with the deltoid muscle. The conjoined tendon was identified. Its lateral margin was dissected and the Kolbel self-retraining retractor inserted. The "three sisters" were identified and cauterized. Bursal tissues were removed to improve visualization. The subscapularis tendon was released from its attachment to the lesser tuberosity 1 cm proximal to its insertion and several tagging sutures placed. The inferior capsule was released with care after identifying and protecting the axillary nerve. The proximal humeral cut was made at approximately 20 of retroversion using the extra-medullary guide.   Attention was redirected to the glenoid. The labrum was debrided circumferentially before the center of the glenoid was marked with electrocautery. The guidewire was drilled into the glenoid neck using the appropriate guide. After verifying its position, it was overreamed with the mini-baseplate reamer to create a flat surface. The permanent mini-baseplate was impacted into place. It was stabilized with a 25 x 6.5 mm central screw and four peripheral screws. Locking screws were placed superiorly, anteriorly, and inferiorly while a nonlocking screw was placed anteriorly. The permanent 36 mm glenosphere with a +3 lateralized offset was then impacted into place and its Morse taper locking mechanism verified using manual distraction.  Attention was directed to the humeral side. Several attempts were made to displace the proximal humerus anterior to the glenoid sphere but it would not displace easily. Therefore, the glenoid sphere was removed and the proximal humerus recut, removing more articular bone posteriorly. Again, utilizing a trial 36 mm glenoosphere with a +3 lateralized offset, an attempt was made  to displace the proximal humerus  anteriorly but again it was too difficult. Therefore, it was elected to proceed with a standard 36 mm glenosphere which was impacted into place after setting it at the "B+" eccentric offset position. The proximal humerus was then subluxed anteriorly to provide access for preparation of the proximal humerus.  The humeral canal was reamed sequentially beginning with the end-cutting reamer then progressing from a 4 mm reamer up to a 13 mm reamer. This provided excellent circumferential chatter. The canal was broached beginning with a #9 broach and progressing to a #13 broach. This was left in place and a trial reduction performed using the standard trial humeral platform. The arm demonstrated excellent range of motion as the hand could be brought across the chest to the opposite shoulder and brought to the top of the patient's head and to the patient's ear. The shoulder appeared stable throughout this range of motion. The joint was dislocated and the trial components removed. The permanent #13 micro-stem was impacted into place with care taken to maintain the appropriate version. The permanent 44 mm humeral platform with the standard insert was put together on the back table and impacted into place. Again, the [facility] taper locking mechanism was verified using manual distraction. The shoulder was relocated using two finger pressure and again placed through a range of motion with the findings as described above.  The wound was copiously irrigated with bacitracin saline solution using the jet lavage system before a total of 30 cc of 0.5% Sensorcaine with epinephrine was injected into the pericapsular and peri-incisional tissues to help with postoperative analgesia. The subscapularis tendon was reapproximated using #2 FiberWire interrupted sutures. The deltopectoral interval was closed using #0 Vicryl interrupted sutures before the subcutaneous tissues were closed using 2-0 Vicryl interrupted sutures. The skin was  closed using staples. Prior to closing the skin, 1 g of transexemic acid in 10 cc of normal saline was injected intra-articularly to help with postoperative bleeding. A sterile occlusive dressing was applied to the wound before the arm was placed into a shoulder immobilizer with an abduction pillow. A Polar Care system also was applied to the shoulder. The patient was then transferred back to a hospital bed before being awakened, extubated, and returned to the recovery room in satisfactory condition after tolerating the procedure well.

## 2017-08-02 NOTE — Progress Notes (Signed)
15 minute call to floor. 

## 2017-08-02 NOTE — Progress Notes (Signed)
Patient is admitted to room 147 from OR. Immobilizer to left shoulder. Honeycomb dressing in place. A&O x4.  Patient was incont on transfer. Reviewed POC and oriented to room, call light, TV and bed controls. Bed alarm on for safety.

## 2017-08-02 NOTE — H&P (Signed)
Paper H&P to be scanned into permanent record. H&P reviewed and patient re-examined. No changes. 

## 2017-08-02 NOTE — NC FL2 (Signed)
[place] LEVEL OF CARE SCREENING TOOL     IDENTIFICATION  Patient Name: Kelsey Weaver Birthdate: Jan 10, 1946 Sex: female Admission Date (Current Location): 08/02/2017  [place] Number:  Engineering geologist and Address:  [facility], [address] [place], [place] [address]      Provider Number: [id number]  Attending Physician Name and Address:  Corky Mull, MD  Relative Name and Phone Number:       Current Level of Care: Hospital Recommended Level of Care: [place] Prior Approval Number:    Date Approved/Denied:   PASRR Number: [id number])  Discharge Plan: SNF    Current Diagnoses: Patient Active Problem List   Diagnosis Date Noted  . 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 Insomnia 12/30/2015  . Type 2 diabetes mellitus ([place]) 12/30/2015  . Hyperlipidemia 12/30/2015  . Extremity edema 12/30/2015  . Atrial fibrillation ([place]) 11/17/2015    Orientation RESPIRATION BLADDER Height & Weight     Self, Place, Situation  O2(3L Nasal Cannula) Continent Weight: 220 lb 3.8 oz (99.9 kg) Height:  5\' 5"  (165.1 cm)  BEHAVIORAL SYMPTOMS/MOOD NEUROLOGICAL BOWEL NUTRITION STATUS      Continent Diet(Clear Liquid to be advanced)  AMBULATORY STATUS COMMUNICATION OF NEEDS Skin   Extensive Assist Verbally Surgical wounds(Incision Left Shoulder)                       Personal Care Assistance Level of Assistance  Bathing, Dressing, Feeding Bathing Assistance: Limited assistance Feeding assistance: Independent Dressing Assistance: Limited assistance     Functional Limitations Info  Sight, Hearing, Speech Sight Info: Adequate Hearing Info: Adequate Speech Info: Adequate    SPECIAL CARE FACTORS FREQUENCY  PT (By licensed PT), OT (By licensed OT)     PT Frequency: (5) OT Frequency: (5)            Contractures      Additional Factors Info  Code Status, Allergies  Code Status Info: (Full Code) Allergies Info: (AMBIEN ZOLPIDEM TARTRATE, COMPAZINE PROCHLORPERAZINE EDISYLATE, METOPROLOL, REGLAN METOCLOPRAMIDE )           Current Medications (08/02/2017):  This is the current hospital active medication list Current Facility-Administered Medications  Medication Dose Route Frequency Provider Last Rate Last Dose  . 0.9 % NaCl with KCl 20 mEq/ L  infusion   Intravenous Continuous Poggi, Marshall Cork, MD 75 mL/hr at 08/02/17 1546    . [START ON 08/03/2017] acetaminophen (TYLENOL) tablet 325-650 mg  325-650 mg Oral Q6H PRN Poggi, Marshall Cork, MD      . ALPRAZolam Duanne Moron) tablet 0.5 mg  0.5 mg Oral QHS PRN Poggi, Marshall Cork, MD      . aspirin EC tablet 81 mg  81 mg Oral Daily Poggi, Marshall Cork, MD      . Derrill Memo ON 08/03/2017] atenolol (TENORMIN) tablet 25 mg  25 mg Oral Daily Poggi, Marshall Cork, MD      . bisacodyl (DULCOLAX) suppository 10 mg  10 mg Rectal Daily PRN Poggi, Marshall Cork, MD      . ceFAZolin (ANCEF) IVPB 2g/100 mL premix  2 g Intravenous Q6H Poggi, Marshall Cork, MD      . cholecalciferol (VITAMIN D) tablet 1,000 Units  1,000 Units Oral Daily Poggi, Marshall Cork, MD      . diphenhydrAMINE (BENADRYL) 12.5 MG/5ML elixir 12.5-25 mg  12.5-25 mg Oral Q4H PRN Poggi, Marshall Cork, MD      .  docusate sodium (COLACE) capsule 100 mg  100 mg Oral BID Poggi, Marshall Cork, MD      . DULoxetine (CYMBALTA) DR capsule 20 mg  20 mg Oral QHS Poggi, Marshall Cork, MD      . Derrill Memo ON 08/03/2017] enoxaparin (LOVENOX) injection 40 mg  40 mg Subcutaneous Q24H Poggi, Marshall Cork, MD      . EPINEPHrine (ADRENALIN) 1 MG/ML           . flecainide (TAMBOCOR) tablet 50 mg  50 mg Oral BID Poggi, Marshall Cork, MD      . HYDROcodone-acetaminophen (NORCO/VICODIN) 5-325 MG per tablet 1-2 tablet  1-2 tablet Oral Q4H PRN Poggi, Marshall Cork, MD      . insulin aspart (novoLOG) injection 0-15 Units  0-15 Units Subcutaneous TID WC Poggi, Marshall Cork, MD      . ketorolac (TORADOL) 15 MG/ML injection 7.5 mg  7.5 mg Intravenous Q6H Poggi, Marshall Cork, MD      . ketorolac  (TORADOL) 15 MG/ML injection           . magnesium hydroxide (MILK OF MAGNESIA) suspension 30 mL  30 mL Oral Daily PRN Poggi, Marshall Cork, MD      . Derrill Memo ON 08/03/2017] metFORMIN (GLUCOPHAGE-XR) 24 hr tablet 500 mg  500 mg Oral Q breakfast Poggi, Marshall Cork, MD      . metoCLOPramide (REGLAN) tablet 5-10 mg  5-10 mg Oral Q8H PRN Poggi, Marshall Cork, MD       Or  . metoCLOPramide (REGLAN) injection 5-10 mg  5-10 mg Intravenous Q8H PRN Poggi, Marshall Cork, MD      . morphine 2 MG/ML injection 0.5-1 mg  0.5-1 mg Intravenous Q2H PRN Poggi, Marshall Cork, MD      . multivitamin with minerals tablet 1 tablet  1 tablet Oral Daily Poggi, Marshall Cork, MD      . multivitamin-lutein (OCUVITE-LUTEIN) capsule 1 capsule  1 capsule Oral Daily Poggi, Marshall Cork, MD      . ondansetron (ZOFRAN) tablet 4 mg  4 mg Oral Q6H PRN Poggi, Marshall Cork, MD       Or  . ondansetron (ZOFRAN) injection 4 mg  4 mg Intravenous Q6H PRN Poggi, Marshall Cork, MD      . Derrill Memo ON 08/03/2017] pantoprazole (PROTONIX) EC tablet 40 mg  40 mg Oral Daily Poggi, Marshall Cork, MD      . pravastatin (PRAVACHOL) tablet 20 mg  20 mg Oral Daily Poggi, Marshall Cork, MD      . sodium phosphate (FLEET) 7-19 GM/118ML enema 1 enema  1 enema Rectal Once PRN Poggi, Marshall Cork, MD      . traZODone (DESYREL) tablet 50 mg  50 mg Oral QHS PRN Poggi, Marshall Cork, MD      . triamterene-hydrochlorothiazide (MAXZIDE-25) 37.5-25 MG per tablet 0.5 tablet  0.5 tablet Oral Daily PRN Poggi, Marshall Cork, MD         Discharge Medications: Please see discharge summary for a list of discharge medications.  Relevant Imaging Results:  Relevant Lab Results:   Additional Information (SSN: [id number])  Smith Mince, Student-Social Work

## 2017-08-02 NOTE — Transfer of Care (Signed)
Immediate Anesthesia Transfer of Care Note  Patient: Kelsey Weaver  Procedure(s) Performed: TOTAL SHOULDER ARTHROPLASTY VS. REVERSE (Left )  Patient Location: PACU  Anesthesia Type:General  Level of Consciousness: awake, alert , oriented and patient cooperative  Airway & Oxygen Therapy: Patient Spontanous Breathing and Patient connected to nasal cannula oxygen  Post-op Assessment: Report given to RN and Post -op Vital signs reviewed and stable  Post vital signs: Reviewed and stable  Last Vitals:  Vitals Value Taken Time  BP 113/57 08/02/2017  1:55 PM  Temp 36.8 C 08/02/2017  1:55 PM  Pulse 83 08/02/2017  1:55 PM  Resp 16 08/02/2017  1:55 PM  SpO2 95 % 08/02/2017  1:55 PM  Vitals shown include unvalidated device data.  Last Pain:  Vitals:   08/02/17 1033  TempSrc:   PainSc: 0-No pain         Complications: No apparent anesthesia complications

## 2017-08-02 NOTE — Anesthesia Preprocedure Evaluation (Addendum)
Anesthesia Evaluation  Patient identified by MRN, date of birth, ID band Patient awake    Reviewed: Allergy & Precautions, NPO status , Patient's Chart, lab work & pertinent test results  History of Anesthesia Complications Negative for: history of anesthetic complications  Airway Mallampati: II       Dental   Pulmonary neg sleep apnea, neg COPD,           Cardiovascular hypertension, Pt. on medications and Pt. on home beta blockers (-) Past MI and (-) CHF + dysrhythmias Atrial Fibrillation (-) Valvular Problems/Murmurs     Neuro/Psych neg Seizures    GI/Hepatic Neg liver ROS, GERD  Medicated,  Endo/Other  diabetes, Type 2, Oral Hypoglycemic Agents  Renal/GU negative Renal ROS     Musculoskeletal   Abdominal   Peds  Hematology   Anesthesia Other Findings   Reproductive/Obstetrics                             Anesthesia Physical  Anesthesia Plan  ASA: III  Anesthesia Plan: General   Post-op Pain Management: GA combined w/ Regional for post-op pain   Induction:   PONV Risk Score and Plan: 3 and Dexamethasone, Ondansetron and Midazolam  Airway Management Planned: Nasal Cannula  Additional Equipment:   Intra-op Plan:   Post-operative Plan:   Informed Consent: I have reviewed the patients History and Physical, chart, labs and discussed the procedure including the risks, benefits and alternatives for the proposed anesthesia with the patient or authorized representative who has indicated his/her understanding and acceptance.       Plan Discussed with:   Anesthesia Plan Comments:         Anesthesia Quick Evaluation  

## 2017-08-02 NOTE — Progress Notes (Signed)
Patient able to move her fingers wnl, fingers pink in color, warm to touch, denies any pain.

## 2017-08-02 NOTE — Anesthesia Procedure Notes (Signed)
Procedure Name: Intubation Date/Time: 08/02/2017 10:50 AM Performed by: Bernardo Heater, CRNA Pre-anesthesia Checklist: Patient identified, Emergency Drugs available, Suction available and Patient being monitored Patient Re-evaluated:Patient Re-evaluated prior to induction Oxygen Delivery Method: Circle system utilized Preoxygenation: Pre-oxygenation with 100% oxygen Induction Type: IV induction Laryngoscope Size: Mac Grade View: Grade I Tube type: Oral Tube size: 7.0 mm Number of attempts: 1 Placement Confirmation: ETT inserted through vocal cords under direct vision,  positive ETCO2 and breath sounds checked- equal and bilateral Secured at: 21 cm Tube secured with: Tape Dental Injury: Teeth and Oropharynx as per pre-operative assessment

## 2017-08-03 ENCOUNTER — Encounter: Payer: Self-pay | Admitting: Surgery

## 2017-08-03 LAB — BASIC METABOLIC PANEL
Anion gap: 9 (ref 5–15)
BUN: 21 mg/dL — ABNORMAL HIGH (ref 6–20)
CO2: 23 mmol/L (ref 22–32)
Calcium: 8.1 mg/dL — ABNORMAL LOW (ref 8.9–10.3)
Chloride: 106 mmol/L (ref 101–111)
Creatinine, Ser: 0.66 mg/dL (ref 0.44–1.00)
GFR calc Af Amer: >60 mL/min (ref >60)
GFR calc non Af Amer: >60 mL/min (ref >60)
Glucose, Bld: 182 mg/dL — ABNORMAL HIGH (ref 65–99)
Potassium: 4.3 mmol/L (ref 3.5–5.1)
Sodium: 138 mmol/L (ref 135–145)

## 2017-08-03 LAB — CBC WITH DIFFERENTIAL/PLATELET
Basophils Absolute: 0 10*3/uL (ref 0–0.1)
Basophils Relative: 0 %
Eosinophils Absolute: 0 10*3/uL (ref 0–0.7)
Eosinophils Relative: 0 %
HCT: 36.7 % (ref 35.0–47.0)
Hemoglobin: 12.4 g/dL (ref 12.0–16.0)
Lymphocytes Relative: 5 %
Lymphs Abs: 0.8 10*3/uL — ABNORMAL LOW (ref 1.0–3.6)
MCH: 31.7 pg (ref 26.0–34.0)
MCHC: 33.6 g/dL (ref 32.0–36.0)
MCV: 94.3 fL (ref 80.0–100.0)
Monocytes Absolute: 1.3 10*3/uL — ABNORMAL HIGH (ref 0.2–0.9)
Monocytes Relative: 9 %
Neutro Abs: 12.4 10*3/uL — ABNORMAL HIGH (ref 1.4–6.5)
Neutrophils Relative %: 86 %
Platelets: 260 10*3/uL (ref 150–440)
RBC: 3.9 MIL/uL (ref 3.80–5.20)
RDW: 13 % (ref 11.5–14.5)
WBC: 14.5 10*3/uL — ABNORMAL HIGH (ref 3.6–11.0)

## 2017-08-03 LAB — GLUCOSE, CAPILLARY
Glucose-Capillary: 187 mg/dL — ABNORMAL HIGH (ref 65–99)
Glucose-Capillary: 150 mg/dL — ABNORMAL HIGH (ref 65–99)

## 2017-08-03 MED ORDER — TRAMADOL HCL 50 MG PO TABS: 50.0000 mg | ORAL_TABLET | Freq: Four times a day (QID) | ORAL | 0 refills | Status: DC | PRN

## 2017-08-03 MED ORDER — ASPIRIN EC 325 MG PO TBEC: 325.0000 mg | DELAYED_RELEASE_TABLET | Freq: Every day | ORAL | 0 refills | Status: DC

## 2017-08-03 MED ORDER — HYDROCODONE-ACETAMINOPHEN 5-325 MG PO TABS: 1.0000 | ORAL_TABLET | ORAL | 0 refills | Status: DC | PRN

## 2017-08-03 NOTE — Progress Notes (Signed)
Patient is being discharged home with husband. Reviewed meds and scripts, along with last does given.  Extra dressing sent with patient. Reviewed discharge instructions.  Allowed time for questions. IV removed with cath intact.

## 2017-08-03 NOTE — Progress Notes (Signed)
  Subjective: 1 Day Post-Op Procedure(s) (LRB): TOTAL SHOULDER ARTHROPLASTY VS. REVERSE (Left) Patient reports pain as mild.  Still excellent pain control from exparel block. Patient is well, and has had no acute complaints or problems Plan is to go Home after hospital stay. Negative for chest pain and shortness of breath Fever: no Gastrointestinal:Negative for nausea and vomiting  Objective: Vital signs in last 24 hours: Temp:  [97.4 F (36.3 C)-98.8 F (37.1 C)] 98.1 F (36.7 C) (03/26 2345) Pulse Rate:  [73-95] 85 (03/26 2345) Resp:  [14-21] 18 (03/26 2345) BP: (108-145)/(46-68) 130/60 (03/26 2345) SpO2:  [91 %-98 %] 96 % (03/26 2345) Weight:  [99.9 kg (220 lb 3.8 oz)] 99.9 kg (220 lb 3.8 oz) (03/26 1450)  Intake/Output from previous day:  Intake/Output Summary (Last 24 hours) at 08/03/2017 0807 Last data filed at 08/02/2017 1546 Gross per 24 hour  Intake 1485 ml  Output 300 ml  Net 1185 ml    Intake/Output this shift: No intake/output data recorded.  Labs: Recent Labs    08/03/17 0503  HGB 12.4   Recent Labs    08/03/17 0503  WBC 14.5*  RBC 3.90  HCT 36.7  PLT 260   Recent Labs    08/03/17 0503  NA 138  K 4.3  CL 106  CO2 23  BUN 21*  CREATININE 0.66  GLUCOSE 182*  CALCIUM 8.1*   No results for input(s): LABPT, INR in the last 72 hours.   EXAM General - Patient is Alert, Appropriate and Oriented Extremity - ABD soft Incision: dressing C/D/I No cellulitis present  Pt is intact to light touch to the left arm over the axillary nerve however decreased sensation due to block. Dressing/Incision - clean, dry, no drainage Motor Function - intact, moving foot and toes well on exam.  Abdomen soft with normal BS.  Past Medical History:  Diagnosis Date  . Cardiac arrhythmia due to congenital heart disease   . Diabetes mellitus without complication ([place])   . Diverticulosis   . DJD (degenerative joint disease)    frozen shoulder  . 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 GERD (gastroesophageal reflux disease)   . History of colonic polyps   . Hyperlipemia   . Overweight   . Paroxysmal atrial fibrillation ([place]) 09/2000    Assessment/Plan: 1 Day Post-Op Procedure(s) (LRB): TOTAL SHOULDER ARTHROPLASTY VS. REVERSE (Left) Active Problems:   Status post reverse total shoulder replacement, left  Estimated body mass index is 36.65 kg/m as calculated from the following:   Height as of this encounter: 5\' 5"  (1.651 m).   Weight as of this encounter: 99.9 kg (220 lb 3.8 oz). Advance diet Up with therapy D/C IV fluids   Labs reviewed this morning, WBC 14.5 but no fevers or tachycardia. Up with therapy today. Plan will be for discharge this afternoon.  DVT Prophylaxis - Lovenox, Foot Pumps and TED hose Non-weightbearing to the left arm.  Raquel James, PA-C [facility] 08/03/2017, 8:07 AM

## 2017-08-03 NOTE — Progress Notes (Signed)
Physical Therapy Treatment Patient Details Name: Kelsey Weaver MRN: [id number] DOB: April 26, 1946 Today's Date: 08/03/2017    History of Present Illness Pt is a 72 y.o. 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  PMH includes htn, DMII, and a-fib.    PT Comments    Pt able to perform bed mobility SBA (bed flat) with initial vc's for technique, transfers independently, ambulation around nursing station independently, and navigating stairs with railing SBA.  No c/o pain during session.  No loss of balance with ambulation and functional mobility.  Plan for pt to discharge home today.    Follow Up Recommendations  Outpatient PT     Equipment Recommendations  None recommended by PT    Recommendations for Other Services OT consult     Precautions / Restrictions Precautions Precautions: Shoulder;Fall Shoulder Interventions: Shoulder sling/immobilizer;Shoulder abduction pillow Restrictions Weight Bearing Restrictions: Yes LUE Weight Bearing: Non weight bearing    Mobility  Bed Mobility Overal bed mobility: Needs Assistance Bed Mobility: Sit to Supine; Supine to sit     Supine to sit: Supervision Sit to supine: Supervision   General bed mobility comments: bed flat; initial vc's for technique and then pt able to perform on own  Transfers Overall transfer level: Independent Equipment used: None Transfers: Sit to/from [employer] to Stand: Independent Stand pivot transfers: Independent       General transfer comment: steady with transfers from bed, toilet, and recliner  Ambulation/Gait Ambulation/Gait assistance: Independent Ambulation Distance (Feet): 300 Feet Assistive device: None Gait Pattern/deviations: WFL(Within Functional Limits)   Gait velocity interpretation: at or above normal speed for age/gender General Gait Details: steady ambulation   Stairs Stairs: Yes  Assist:  SBA Stair Management: Step to pattern Number of Stairs: 4 General stair comments: R railing ascending 2 steps; no railing ascending 2 steps; railing descending 4 steps; steady  Wheelchair Mobility    Modified Rankin (Stroke Patients Only)       Balance Overall balance assessment: Modified Independent Sitting-balance support: No upper extremity supported;Feet supported Sitting balance-Leahy Scale: Normal Sitting balance - Comments: steady sitting reaching outside BOS with R UE   Standing balance support: No upper extremity supported;During functional activity Standing balance-Leahy Scale: Good Standing balance comment: no loss of balance with ambulation and head turns R/L/up/down, increasing/decreasing speed, and turning and stopping                            Cognition Arousal/Alertness: Awake/alert Behavior During Therapy: WFL for tasks assessed/performed Overall Cognitive Status: Within Functional Limits for tasks assessed                                        Exercises     General Comments General comments (skin integrity, edema, etc.): shoulder sling/immobilizer and polar care in place at start and end of session.  Nursing cleared pt for participation in physical therapy.  Pt agreeable to PT session.      Pertinent Vitals/Pain Pain Assessment: No/denies pain  Vitals (HR and O2 on room air) stable and WFL throughout treatment session.    Home Living Family/patient expects to be discharged to:: Private residence Living Arrangements: Spouse/significant other Available Help at Discharge: Family;Available 24 hours/day Type of Home: House Home Access: Stairs to enter Entrance Stairs-Rails: None Home Layout: Two level;Able to  live on main level with bedroom/bathroom Home Equipment: None      Prior Function Level of Independence: Independent      Comments: Pt reports no h/o recent falls.   PT Goals (current goals can now be found in the  care plan section) Acute Rehab PT Goals Patient Stated Goal: to go home PT Goal Formulation: With patient Time For Goal Achievement: 08/16/17 Potential to Achieve Goals: Good Progress towards PT goals: Progressing toward goals    Frequency    BID      PT Plan Current plan remains appropriate    Co-evaluation              AM-PAC PT "6 Clicks" Daily Activity  Outcome Measure  Difficulty turning over in bed (including adjusting bedclothes, sheets and blankets)?: A Little Difficulty moving from lying on back to sitting on the side of the bed? : A Little Difficulty sitting down on and standing up from a chair with arms (e.g., wheelchair, bedside commode, etc,.)?: None Help needed moving to and from a bed to chair (including a wheelchair)?: None Help needed walking in hospital room?: None Help needed climbing 3-5 steps with a railing? : A Little 6 Click Score: 21    End of Session Equipment Utilized During Treatment: Gait belt Activity Tolerance: Patient tolerated treatment well Patient left: in chair;with call bell/phone within reach;with chair alarm set;with family/visitor present Nurse Communication: Mobility status;Precautions;Weight bearing status PT Visit Diagnosis: Other abnormalities of gait and mobility (R26.89);Pain Pain - Right/Left: Left Pain - part of body: Shoulder     Time: 3790-2409 PT Time Calculation (min) (ACUTE ONLY): 23 min  Charges:  $Gait Training: 8-22 mins $Therapeutic Activity: 8-22 mins                    G CodesLeitha Bleak, PT 08/03/17, 11:36 AM [phone]

## 2017-08-03 NOTE — Discharge Summary (Signed)
Physician Discharge Summary  Patient ID: Kelsey Weaver MRN: [id number] DOB/AGE: 06/04/1945 72 y.o.  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 Insomnia 12/30/2015  . Type 2 diabetes mellitus ([place]) 12/30/2015  . Hyperlipidemia 12/30/2015  . Extremity edema 12/30/2015  . 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 Cardiac arrhythmia due to congenital heart disease   . Diabetes mellitus without complication ([place])   . Diverticulosis   . DJD (degenerative joint disease)    frozen shoulder  . 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 GERD (gastroesophageal reflux disease)   . History of colonic polyps   . Hyperlipemia   . Overweight   . Paroxysmal atrial fibrillation ([place]) 09/2000     Transfusion: None.   Consultants (if any):   Discharged Condition: Improved  Hospital Course: Kelsey Weaver is an 72 y.o. 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    Surgeries: Procedure(s): TOTAL SHOULDER ARTHROPLASTY VS. REVERSE on 08/02/2017 Patient tolerated the surgery well. Taken to PACU where she was stabilized and then transferred to the orthopedic floor.  Started on Lovenox 40mg  q 24 hrs. Foot pumps applied bilaterally at 80 mm. Heels elevated on bed with rolled towels. No evidence of DVT. Negative [place]. Physical therapy started on day #1 for gait training and  transfer. OT started day #1 for ADL and assisted devices.  Patient's IV was removed on POD1.  Implants: All press-fit Bi[employer] Comprehensive system with a #13 micro-humeral stem, a 44 mm humeral tray with a standard insert, and a mini-base plate with a 36 mm glenosphere.  She was given perioperative antibiotics:  Anti-infectives (From admission, onward)   Start     Dose/Rate Route Frequency Ordered Stop   08/02/17 1700  ceFAZolin (ANCEF) IVPB 2g/100 mL premix     2 g 200 mL/hr over 30 Minutes Intravenous Every 6 hours 08/02/17 1449 08/03/17 1059   08/02/17 0933  ceFAZolin (ANCEF) 2-4 GM/100ML-% IVPB    Note to Pharmacy:  Phineas Real   : cabinet override      08/02/17 0933 08/02/17 1100   08/01/17 2230  ceFAZolin (ANCEF) IVPB 2g/100 mL premix     2 g 200 mL/hr over 30 Minutes Intravenous  Once 08/01/17 2229 08/02/17 1115    .  She was given sequential compression devices, early ambulation, and Lovenox for DVT prophylaxis.  She benefited maximally from the hospital stay and there were no complications.    Recent vital signs:  Vitals:   08/02/17 1933 08/02/17 2345  BP: (!) 113/51 130/60  Pulse: 84 85  Resp: 18 18  Temp: 98.3 F (36.8 C) 98.1 F (36.7 C)  SpO2: 97% 96%    Recent laboratory studies:  Lab Results  Component Value Date   HGB 12.4 08/03/2017   HGB 15.7 07/19/2017   Lab Results  Component Value Date   WBC 14.5 (H) 08/03/2017   PLT 260 08/03/2017   Lab Results  Component Value Date   INR  0.95 07/19/2017   Lab Results  Component Value Date   NA 138 08/03/2017   K 4.3 08/03/2017   CL 106 08/03/2017   CO2 23 08/03/2017   BUN 21 (H) 08/03/2017   CREATININE 0.66 08/03/2017   GLUCOSE 182 (H) 08/03/2017    Discharge Medications:   Allergies as of 08/03/2017      Reactions   Ambien [zolpidem Tartrate] Other (See Comments)   Flu like symptoms    Compazine [prochlorperazine Edisylate] Other (See Comments)   Aches and pains, generalized   Metoprolol  Other (See Comments)   Depression    Reglan [metoclopramide] Anxiety      Medication List    STOP taking these medications   aspirin 81 MG tablet Replaced by:  aspirin EC 325 MG tablet     TAKE these medications   ALPRAZolam 0.5 MG tablet Commonly known as:  XANAX Take 0.5 mg by mouth at bedtime as needed for sleep.   aspirin EC 325 MG tablet Take 1 tablet (325 mg total) by mouth daily. Replaces:  aspirin 81 MG tablet   atenolol 25 MG tablet Commonly known as:  TENORMIN TAKE 1 TABLET BY MOUTH EVERY DAY AS NEEDED FOR HEART RATE What changed:  See the new instructions.   cholecalciferol 1000 units tablet Commonly known as:  VITAMIN D Take 1,000 Units by mouth daily.   DULoxetine 20 MG capsule Commonly known as:  CYMBALTA Take 1 capsule (20 mg total) by mouth at bedtime.   EYE VITAMINS & MINERALS Tabs Take 1 tablet by mouth daily. [employer] otc supplement   flecainide 50 MG tablet Commonly known as:  TAMBOCOR Take 1 tablet (50 mg total) by mouth 2 (two) times daily as needed (AFIB). What changed:  when to take this   GLUCOSAMINE 1500 COMPLEX Caps Take 1 capsule by mouth 2 (two) times daily.   HYDROcodone-acetaminophen 5-325 MG tablet Commonly known as:  NORCO/VICODIN Take 1-2 tablets by mouth every 4 (four) hours as needed for moderate pain.   ibuprofen 800 MG tablet Commonly known as:  ADVIL,MOTRIN Take 800 mg by mouth at bedtime.   metFORMIN 500 MG 24 hr tablet Commonly known as:  GLUCOPHAGE XR Take 1 tablet (500 mg total) by mouth daily with breakfast.   multivitamin tablet Take 1 tablet by mouth daily.   OVER THE COUNTER MEDICATION Take 1 capsule by mouth daily. MegaRed Omega-3 Krill Oil 1,000 mg-230 mg-60 mg   OVER THE COUNTER MEDICATION Take 1 capsule by mouth daily. [employer]bal[employer] Thermo Bond   OVER THE COUNTER MEDICATION Take 1 capsule by mouth 3 (three) times daily. Herbalife Total Control   OVER THE COUNTER MEDICATION Take 1 capsule by mouth  2 (two) times daily. [employer] Aminogen   OVER THE COUNTER MEDICATION Take 1 capsule by mouth 2 (two) times daily. Herbalife Cell Activator   OVER THE COUNTER MEDICATION Take 1 tablet by mouth 2 (two) times daily. Herbalife Snack Defence   OVER THE COUNTER MEDICATION Take 1 tablet by mouth 3 (three) times daily. Herbalife Cell-U-Loss   PEPCID AC MAXIMUM STRENGTH 20 MG tablet Generic drug:  famotidine Take 20 mg by mouth at bedtime.   pravastatin 20 MG tablet Commonly known as:  PRAVACHOL TAKE ONE TABLET BY MOUTH ONCE DAILY What changed:    how much to take  how to take this  when to take this   traMADol 50 MG tablet Commonly known as:  ULTRAM Take 1-2 tablets (50-100 mg total) by mouth every  6 (six) hours as needed for moderate pain. What changed:    how much to take  when to take this   traZODone 50 MG tablet Commonly known as:  DESYREL Take 1 tablet (50 mg total) by mouth at bedtime as needed for sleep. What changed:    how much to take  when to take this   triamterene-hydrochlorothiazide 37.5-25 MG tablet Commonly known as:  MAXZIDE-25 Take 0.5 tablets by mouth daily as needed ([place]).   Turmeric 500 MG Tabs Take 500 mg by mouth daily.       Diagnostic Studies: Dg Shoulder Left Port  Result Date: 08/02/2017 CLINICAL DATA:  Status post left total shoulder replacement EXAM: LEFT SHOULDER - 2 VIEW COMPARISON:  Left shoulder MRI June 21, 2017 FINDINGS: Frontal and Y scapular images obtained. There is a total shoulder replacement on the left with prosthetic components well-seated. No fracture or dislocation. There is osteoarthritic change in the acromioclavicular joint. Visualized left lung is clear. IMPRESSION: Total shoulder replacement on the left with prosthetic components well-seated. No acute fracture or dislocation. Moderate osteoarthritic change in the acromioclavicular joint. Electronically Signed   By: Lowella Grip III M.D.   On: 08/02/2017  14:51   Disposition: Plan will be for discharge home this afternoon.  Follow-up Information    Lattie Corns, PA-C Follow up.   Specialty:  Physician Assistant Why:  Electa Sniff information: [place] [address] [phone]          Signed: Judson Roch PA-C 08/03/2017, 8:15 AM

## 2017-08-03 NOTE — Evaluation (Signed)
Occupational Therapy Evaluation Patient Details Name: Kelsey Weaver MRN: [id number] DOB: [birth year] Today's Date: 08/03/2017    History of Present Illness Pt is a 72 y.o. 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  PMH includes htn, DMII, and a-fib.   Clinical Impression   Patient was evaluated this date by OT. Pt lives at home with her spouse on the main floor of the home with walk-in shower and 1 step to enter. Patient was independent prior to admission including driving and was retired. Patient has family to assist at home as needed. Patient has orders for LUE immobilized and will be NWBing per MD. Patient presents with decreased strength, decreased ability to perform self care tasks and will need further instructions for managing daily self care tasks with use of one hand only. Pt instructed in polar care mgt, sling mgt, compression stocking mgt, strategies for bathing and dressing, falls prevention, and positioning of LUE for sleep. Pt verbalized understanding, handout provided. Patient will benefit from skilled OT care to address these limitations and improve independence in daily tasks to return home with family.Patient would like to go home at discharge home and would benefit from continued follow up therapies at recommended by the surgeon.    Follow Up Recommendations  Follow surgeon's recommendation for DC plan and follow-up therapies    Equipment Recommendations  None recommended by OT    Recommendations for Other Services       Precautions / Restrictions Precautions Precautions: Shoulder;Fall Shoulder Interventions: Shoulder sling/immobilizer;Shoulder abduction pillow Restrictions Weight Bearing Restrictions: Yes LUE Weight Bearing: Non weight bearing      Mobility Bed Mobility Overal bed mobility: Needs Assistance Bed Mobility: Supine to Sit     Supine to sit: Min assist;Min guard      General bed mobility comments: min guard - min assist to get out on L side of bed, encouraged pt to get out on R side of bed at home to improve independence with bed mobility  Transfers Overall transfer level: Needs assistance Equipment used: None Transfers: Sit to/from [employer] Sit to Stand: Min guard Stand pivot transfers: Min guard            Balance Overall balance assessment: Needs assistance Sitting-balance support: No upper extremity supported;Feet supported Sitting balance-Leahy Scale: Good     Standing balance support: No upper extremity supported;During functional activity Standing balance-Leahy Scale: Good                             ADL either performed or assessed with clinical judgement   ADL Overall ADL's : Needs assistance/impaired Eating/Feeding: Sitting;Modified independent   Grooming: Standing;Modified independent Grooming Details (indicate cue type and reason): instructed in technique for deodorant application to L underarm Upper Body Bathing: Sitting;Minimal assistance;With caregiver independent assisting Upper Body Bathing Details (indicate cue type and reason): instructed in technique for washing L underarm Lower Body Bathing: Sit to/from stand;Supervison/ safety   Upper Body Dressing : Sitting;Minimal assistance;With caregiver independent assisting Upper Body Dressing Details (indicate cue type and reason): instructed in techniques for UB dressing with 1 arm Lower Body Dressing: Sit to/from stand;Min guard   Toilet Transfer: Ambulation;Comfort height toilet;Min guard           Functional mobility during ADLs: Min guard       Vision Baseline Vision/History: No visual deficits Patient Visual Report: No change from baseline  Perception     Praxis      Pertinent Vitals/Pain Pain Assessment: No/denies pain     Hand Dominance Right   Extremity/Trunk Assessment Upper Extremity Assessment Upper  Extremity Assessment: Overall WFL for tasks assessed;LUE deficits/detail LUE Deficits / Details: immobilized; elbow/wrist/hand ROM WFL; pt reports nerve block still in place with limited sensation through the arm LUE: Unable to fully assess due to immobilization LUE Sensation: decreased light touch LUE Coordination: decreased fine motor;decreased gross motor   Lower Extremity Assessment Lower Extremity Assessment: Overall WFL for tasks assessed   Cervical / Trunk Assessment Cervical / Trunk Assessment: Normal   Communication Communication Communication: No difficulties   Cognition Arousal/Alertness: Awake/alert Behavior During Therapy: WFL for tasks assessed/performed Overall Cognitive Status: Within Functional Limits for tasks assessed                                     General Comments  should sling/immobilizer and polar care in place at start and end of session    Exercises Other Exercises Other Exercises: Pt instructed in polar care mgt, sling mgt, compression stocking mgt, strategies for bathing and dressing, falls prevention, positioning of LUE for sleep, and PROM FF and ER exercises. Pt verbalized understanding, handout provided.   Shoulder Instructions Shoulder Instructions Donning/doffing shirt without moving shoulder: Patient able to independently direct caregiver Method for sponge bathing under operated UE: Independent Donning/doffing sling/immobilizer: Patient able to independently direct caregiver Correct positioning of sling/immobilizer: Patient able to independently direct caregiver ROM for elbow, wrist and digits of operated UE: Independent Sling wearing schedule (on at all times/off for ADL's): Independent Proper positioning of operated UE when showering: Independent Positioning of UE while sleeping: Patient able to independently direct caregiver    Home Living Family/patient expects to be discharged to:: Private residence Living Arrangements:  Spouse/significant other Available Help at Discharge: Family;Available 24 hours/day Type of Home: House Home Access: Stairs to enter [employer] of Steps: 1 small 4-5 inch step Entrance Stairs-Rails: None Home Layout: Two level;Able to live on main level with bedroom/bathroom     Bathroom Shower/Tub: Occupational psychologist: Handicapped height     Home Equipment: None          Prior Functioning/Environment Level of Independence: Independent        Comments: Pt reports no h/o recent falls.        OT Problem List: Decreased strength;Decreased knowledge of use of DME or AE;Decreased range of motion;Impaired UE functional use;Impaired sensation      OT Treatment/Interventions: Self-care/ADL training;Therapeutic exercise;Therapeutic activities;DME and/or AE instruction;Patient/family education    OT Goals(Current goals can be found in the care plan section) Acute Rehab OT Goals Patient Stated Goal: to go home OT Goal Formulation: With patient/family Time For Goal Achievement: 08/17/17 Potential to Achieve Goals: Good  OT Frequency: Min 1X/week   Barriers to D/C:            Co-evaluation              AM-PAC PT "6 Clicks" Daily Activity     Outcome Measure Help from another person eating meals?: None Help from another person taking care of personal grooming?: None Help from another person toileting, which includes using toliet, bedpan, or urinal?: None Help from another person bathing (including washing, rinsing, drying)?: A Little Help from another person to put on and taking off regular upper body clothing?: A  Little Help from another person to put on and taking off regular lower body clothing?: A Little 6 Click Score: 21   End of Session Equipment Utilized During Treatment: Gait belt  Activity Tolerance: Patient tolerated treatment well Patient left: in chair;with call bell/phone within reach;with chair alarm set;with family/visitor  present  OT Visit Diagnosis: Muscle weakness (generalized) (M62.81)                Time: 1448-1856 OT Time Calculation (min): 56 min Charges:  OT General Charges $OT Visit: 1 Visit OT Evaluation $OT Eval Low Complexity: 1 Low OT Treatments $Self Care/Home Management : 38-52 mins  Jeni Salles, MPH, MS, OTR/L ascom [phone] 08/03/17, 10:18 AM

## 2017-08-03 NOTE — Care Management (Signed)
[facility] met with patient at bedside and discussed PT recommendations. She states she prefers outpatient but will do what ever Dr. Roland Rack recommends. She has a ride to and from outpatient Pt if needed.  Spoke with Dr. Roland Rack. Informed him of PT recommendations. He states he will have his office call and set up outpatient PT with patient.  Patient updated. She will discharge home on ASA. No DME needs.

## 2017-08-03 NOTE — Discharge Instructions (Signed)
Interscalene Nerve Block with Exparel  1.  For your surgery you have received an Interscalene Nerve Block with Exparel. 2. Nerve Blocks affect many types of nerves, including nerves that control movement, pain and normal sensation.  You may experience feelings such as numbness, tingling, heaviness, weakness or the inability to move your arm or the feeling or sensation that your arm has "fallen asleep". 3. A nerve block with Exparel can last up to 5 days.  Usually the weakness wears off first.  The tingling and heaviness usually wear off next.  Finally you may start to notice pain.  Keep in mind that this may occur in any order.  Once a nerve block starts to wear off it is usually completely gone within 60 minutes. 4. ISNB may cause mild shortness of breath, a hoarse voice, blurry vision, unequal pupils, or drooping of the face on the same side as the nerve block.  These symptoms will usually resolve with the numbness.  Very rarely the procedure itself can cause mild seizures. 5. If needed, your surgeon will give you a prescription for pain medication.  It will take about 60 minutes for the oral pain medication to become fully effective.  So, it is recommended that you start taking this medication before the nerve block first begins to wear off, or when you first begin to feel discomfort. 6. Take your pain medication only as prescribed.  Pain medication can cause sedation and decrease your breathing if you take more than you need for the level of pain that you have. 7. Nausea is a common side effect of many pain medications.  You may want to eat something before taking your pain medicine to prevent nausea. 8. After an Interscalene nerve block, you cannot feel pain, pressure or extremes in temperature in the effected arm.  Because your arm is numb it is at an increased risk for injury.  To decrease the possibility of injury, please practice the following:  a. While you are awake change the position of  your arm frequently to prevent too much pressure on any one area for prolonged periods of time. b.  If you have a cast or tight dressing, check the color or your fingers every couple of hours.  Call your surgeon with the appearance of any discoloration (white or blue). c. If you are given a sling to wear before you go home, please wear it  at all times until the block has completely worn off.  Do not get up at night without your sling. d. Please contact [facility] or your surgeon if you do not begin to regain sensation after 7 days from the surgery.  Anesthesia may be contacted by calling the Same Day Surgery Department, Mon. through Fri., 6 am to 4 pm at [phone].   e. If you experience any other problems or concerns, please contact your surgeon's office. f. If you experience severe or prolonged shortness of breath go to the nearest emergency department.   Diet: As you were doing prior to hospitalization   Shower:  May shower but keep the wounds dry, use an occlusive plastic wrap, NO SOAKING IN TUB.  If the bandage gets wet, change with a clean dry gauze.  Dressing:  You may change your dressing as needed. Change the dressing with sterile gauze dressing.    Activity:  Increase activity slowly as tolerated, but follow the weight bearing instructions below.  No lifting or driving for 6 weeks.  Weight Bearing:  Non-weightbearing to the left arm.  Blood Clot Prevention: Take 1 325mg  aspirin daily until first post-op appointment.  To prevent constipation: you may use a stool softener such as -  Colace (over the counter) 100 mg by mouth twice a day  Drink plenty of fluids (prune juice may be helpful) and high fiber foods Miralax (over the counter) for constipation as needed.    Itching:  If you experience itching with your medications, try taking only a single pain pill, or even half a pain pill at a time.  You may take up to 10 pain pills per day, and you can also use benadryl over  the counter for itching or also to help with sleep.   Precautions:  If you experience chest pain or shortness of breath - call 911 immediately for transfer to the hospital emergency department!!  If you develop a fever greater that 101 F, purulent drainage from wound, increased redness or drainage from wound, or calf pain-Call [place]                                              Follow- Up Appointment:  Please call for an appointment to be seen in 2 weeks at [facility]

## 2017-08-03 NOTE — Progress Notes (Signed)
Clinical Social Worker (CSW) received SNF consult. PT is recommending outpatient PT. RN case manager aware of above. Please reconsult if future social work needs arise. CSW signing off.   Zakk Borgen, LCSW [phone]  

## 2017-08-04 LAB — SURGICAL PATHOLOGY

## 2017-08-05 DIAGNOSIS — Z96612 Presence of left artificial shoulder joint: Secondary | ICD-10-CM | POA: Diagnosis not present

## 2017-08-10 ENCOUNTER — Telehealth: Payer: Self-pay

## 2017-08-10 NOTE — Telephone Encounter (Signed)
EMMI Follow-up: Talked with Kelsey Weaver and she said she still wasn't feeling well but had no concerns at this time.  She said, she most likely hit the wrong button on the second call and then hung up.

## 2017-08-11 DIAGNOSIS — Z96612 Presence of left artificial shoulder joint: Secondary | ICD-10-CM | POA: Diagnosis not present

## 2017-08-17 DIAGNOSIS — Z96612 Presence of left artificial shoulder joint: Secondary | ICD-10-CM | POA: Diagnosis not present

## 2017-08-24 DIAGNOSIS — Z96612 Presence of left artificial shoulder joint: Secondary | ICD-10-CM | POA: Diagnosis not present

## 2017-08-25 ENCOUNTER — Other Ambulatory Visit: Payer: Self-pay | Admitting: Internal Medicine

## 2017-08-31 DIAGNOSIS — Z96612 Presence of left artificial shoulder joint: Secondary | ICD-10-CM | POA: Diagnosis not present

## 2017-09-08 DIAGNOSIS — Z96612 Presence of left artificial shoulder joint: Secondary | ICD-10-CM | POA: Diagnosis not present

## 2017-09-12 DIAGNOSIS — Z96612 Presence of left artificial shoulder joint: Secondary | ICD-10-CM | POA: Diagnosis not present

## 2017-09-13 DIAGNOSIS — Z96612 Presence of left artificial shoulder joint: Secondary | ICD-10-CM | POA: Diagnosis not present

## 2017-09-13 DIAGNOSIS — M25512 Pain in left shoulder: Secondary | ICD-10-CM | POA: Diagnosis not present

## 2017-09-15 DIAGNOSIS — Z96612 Presence of left artificial shoulder joint: Secondary | ICD-10-CM | POA: Diagnosis not present

## 2017-09-19 DIAGNOSIS — Z96612 Presence of left artificial shoulder joint: Secondary | ICD-10-CM | POA: Diagnosis not present

## 2017-09-22 DIAGNOSIS — Z96612 Presence of left artificial shoulder joint: Secondary | ICD-10-CM | POA: Diagnosis not present

## 2017-09-23 ENCOUNTER — Other Ambulatory Visit: Payer: Self-pay

## 2017-09-23 ENCOUNTER — Other Ambulatory Visit: Payer: Self-pay | Admitting: Internal Medicine

## 2017-09-23 MED ORDER — ATENOLOL 25 MG PO TABS: 25.0000 mg | ORAL_TABLET | ORAL | 0 refills | Status: DC | PRN

## 2017-09-27 ENCOUNTER — Encounter: Payer: Self-pay | Admitting: Podiatry

## 2017-09-27 DIAGNOSIS — Z96612 Presence of left artificial shoulder joint: Secondary | ICD-10-CM | POA: Diagnosis not present

## 2017-09-28 MED ORDER — GABAPENTIN 300 MG PO CAPS: 300.0000 mg | ORAL_CAPSULE | Freq: Three times a day (TID) | ORAL | 3 refills | Status: DC

## 2017-09-28 NOTE — Telephone Encounter (Signed)
Dr. Milinda Pointer ordered Gabapentin 300mg  #30 as on 01/03/2017 +3 refills. I informed pt by MyChart.

## 2017-09-29 DIAGNOSIS — Z96612 Presence of left artificial shoulder joint: Secondary | ICD-10-CM | POA: Diagnosis not present

## 2017-10-06 DIAGNOSIS — Z96612 Presence of left artificial shoulder joint: Secondary | ICD-10-CM | POA: Diagnosis not present

## 2017-10-10 DIAGNOSIS — Z96612 Presence of left artificial shoulder joint: Secondary | ICD-10-CM | POA: Diagnosis not present

## 2017-10-14 DIAGNOSIS — Z96612 Presence of left artificial shoulder joint: Secondary | ICD-10-CM | POA: Diagnosis not present

## 2017-10-16 ENCOUNTER — Other Ambulatory Visit: Payer: Self-pay | Admitting: Internal Medicine

## 2017-10-17 DIAGNOSIS — Z96612 Presence of left artificial shoulder joint: Secondary | ICD-10-CM | POA: Diagnosis not present

## 2017-10-19 DIAGNOSIS — Z96612 Presence of left artificial shoulder joint: Secondary | ICD-10-CM | POA: Diagnosis not present

## 2017-10-25 DIAGNOSIS — Z96612 Presence of left artificial shoulder joint: Secondary | ICD-10-CM | POA: Diagnosis not present

## 2017-10-27 DIAGNOSIS — Z96612 Presence of left artificial shoulder joint: Secondary | ICD-10-CM | POA: Diagnosis not present

## 2017-10-28 ENCOUNTER — Encounter (INDEPENDENT_AMBULATORY_CARE_PROVIDER_SITE_OTHER): Payer: Self-pay

## 2017-10-28 ENCOUNTER — Telehealth: Payer: Self-pay

## 2017-10-28 NOTE — Telephone Encounter (Signed)
Copied from [place] [id number]. Topic: Referral - Request >> Oct 28, 2017 11:52 AM Bea Graff, NT wrote: Reason for CRM: Pt would like a referral to a dermatologist that Alma Friendly recommends. Pt would like a call with some recommendations before the referral is sent.

## 2017-10-28 NOTE — Telephone Encounter (Signed)
Will send patient MyChart message

## 2017-11-02 DIAGNOSIS — Z96612 Presence of left artificial shoulder joint: Secondary | ICD-10-CM | POA: Diagnosis not present

## 2017-11-04 DIAGNOSIS — Z96612 Presence of left artificial shoulder joint: Secondary | ICD-10-CM | POA: Diagnosis not present

## 2017-11-08 DIAGNOSIS — Z96612 Presence of left artificial shoulder joint: Secondary | ICD-10-CM | POA: Diagnosis not present

## 2017-11-16 DIAGNOSIS — M7062 Trochanteric bursitis, left hip: Secondary | ICD-10-CM | POA: Diagnosis not present

## 2017-11-16 DIAGNOSIS — Z96612 Presence of left artificial shoulder joint: Secondary | ICD-10-CM | POA: Diagnosis not present

## 2017-11-21 DIAGNOSIS — Z96612 Presence of left artificial shoulder joint: Secondary | ICD-10-CM | POA: Diagnosis not present

## 2017-11-24 DIAGNOSIS — Z96612 Presence of left artificial shoulder joint: Secondary | ICD-10-CM | POA: Diagnosis not present

## 2017-11-29 DIAGNOSIS — Z96612 Presence of left artificial shoulder joint: Secondary | ICD-10-CM | POA: Diagnosis not present

## 2017-12-05 ENCOUNTER — Ambulatory Visit: Payer: Medicare Other

## 2017-12-06 ENCOUNTER — Ambulatory Visit: Payer: Medicare Other

## 2017-12-12 ENCOUNTER — Encounter: Payer: Medicare Other | Admitting: Primary Care

## 2017-12-13 ENCOUNTER — Other Ambulatory Visit: Payer: Self-pay | Admitting: Primary Care

## 2017-12-13 DIAGNOSIS — E119 Type 2 diabetes mellitus without complications: Secondary | ICD-10-CM

## 2017-12-20 DIAGNOSIS — L570 Actinic keratosis: Secondary | ICD-10-CM | POA: Diagnosis not present

## 2017-12-21 ENCOUNTER — Ambulatory Visit: Payer: Medicare Other

## 2017-12-22 ENCOUNTER — Encounter: Payer: Medicare Other | Admitting: Primary Care

## 2018-01-19 ENCOUNTER — Ambulatory Visit: Payer: Medicare Other

## 2018-01-25 ENCOUNTER — Encounter: Payer: Medicare Other | Admitting: Primary Care

## 2018-01-26 LAB — HM DIABETES EYE EXAM

## 2018-01-27 ENCOUNTER — Encounter: Payer: Self-pay | Admitting: Primary Care

## 2018-02-09 ENCOUNTER — Other Ambulatory Visit: Payer: Self-pay | Admitting: Primary Care

## 2018-02-09 DIAGNOSIS — E119 Type 2 diabetes mellitus without complications: Secondary | ICD-10-CM

## 2018-02-09 DIAGNOSIS — E7849 Other hyperlipidemia: Secondary | ICD-10-CM

## 2018-02-15 ENCOUNTER — Ambulatory Visit (INDEPENDENT_AMBULATORY_CARE_PROVIDER_SITE_OTHER): Payer: Medicare Other

## 2018-02-15 VITALS — BP 110/70 | HR 75 | Temp 98.0°F | Ht 65.25 in | Wt 195.5 lb

## 2018-02-15 DIAGNOSIS — M25562 Pain in left knee: Secondary | ICD-10-CM | POA: Diagnosis not present

## 2018-02-15 DIAGNOSIS — Z9889 Other specified postprocedural states: Secondary | ICD-10-CM | POA: Diagnosis not present

## 2018-02-15 DIAGNOSIS — E7849 Other hyperlipidemia: Secondary | ICD-10-CM | POA: Diagnosis not present

## 2018-02-15 DIAGNOSIS — M25552 Pain in left hip: Secondary | ICD-10-CM | POA: Diagnosis not present

## 2018-02-15 DIAGNOSIS — Z23 Encounter for immunization: Secondary | ICD-10-CM | POA: Diagnosis not present

## 2018-02-15 DIAGNOSIS — Z0000 Encounter for general adult medical examination without abnormal findings: Secondary | ICD-10-CM

## 2018-02-15 DIAGNOSIS — E119 Type 2 diabetes mellitus without complications: Secondary | ICD-10-CM

## 2018-02-15 DIAGNOSIS — M7062 Trochanteric bursitis, left hip: Secondary | ICD-10-CM | POA: Diagnosis not present

## 2018-02-15 DIAGNOSIS — M1712 Unilateral primary osteoarthritis, left knee: Secondary | ICD-10-CM | POA: Diagnosis not present

## 2018-02-15 DIAGNOSIS — S76012A Strain of muscle, fascia and tendon of left hip, initial encounter: Secondary | ICD-10-CM | POA: Diagnosis not present

## 2018-02-15 LAB — COMPREHENSIVE METABOLIC PANEL
ALT: 17 U/L (ref 0–35)
AST: 19 U/L (ref 0–37)
Albumin: 4.4 g/dL (ref 3.5–5.2)
Alkaline Phosphatase: 53 U/L (ref 39–117)
BUN: 19 mg/dL (ref 6–23)
CO2: 35 mEq/L — ABNORMAL HIGH (ref 19–32)
Calcium: 9.7 mg/dL (ref 8.4–10.5)
Chloride: 100 mEq/L (ref 96–112)
Creatinine, Ser: 0.91 mg/dL (ref 0.40–1.20)
GFR: 64.59 mL/min (ref >60.00)
Glucose, Bld: 113 mg/dL — ABNORMAL HIGH (ref 70–99)
Potassium: 3.7 mEq/L (ref 3.5–5.1)
Sodium: 140 mEq/L (ref 135–145)
Total Bilirubin: 0.5 mg/dL (ref 0.2–1.2)
Total Protein: 7.3 g/dL (ref 6.0–8.3)

## 2018-02-15 LAB — HEMOGLOBIN A1C: Hgb A1c MFr Bld: 5.9 % (ref 4.6–6.5)

## 2018-02-15 LAB — MICROALBUMIN / CREATININE URINE RATIO
Creatinine,U: 168.4 mg/dL
Microalb Creat Ratio: 1.2 mg/g (ref 0.0–30.0)
Microalb, Ur: 2.1 mg/dL — ABNORMAL HIGH (ref 0.0–1.9)

## 2018-02-15 LAB — LIPID PANEL
Cholesterol: 116 mg/dL (ref 0–200)
HDL: 41.1 mg/dL (ref >39.00)
LDL Cholesterol: 53 mg/dL (ref 0–99)
NonHDL: 74.77
Total CHOL/HDL Ratio: 3
Triglycerides: 110 mg/dL (ref 0.0–149.0)
VLDL: 22 mg/dL (ref 0.0–40.0)

## 2018-02-15 NOTE — Patient Instructions (Signed)
Kelsey Weaver , Thank you for taking time to come for your Medicare Wellness Visit. I appreciate your ongoing commitment to your health goals. Please review the following plan we discussed and let me know if I can assist you in the future.   These are the goals we discussed: Goals    . Increase physical activity     Starting 02/15/2018, I will continue to walk 2-3 miles daily.        This is a list of the screening recommended for you and due dates:  Health Maintenance  Topic Date Due  . Complete foot exam   02/22/2018*  . Hemoglobin A1C  08/17/2018  . Eye exam for diabetics  01/27/2019  . Urine Protein Check  02/16/2019  . Mammogram  06/08/2019  . Tetanus Vaccine  05/10/2020  . Colon Cancer Screening  09/17/2021  . Flu Shot  Completed  . DEXA scan (bone density measurement)  Completed  .  Hepatitis C: One time screening is recommended by Center for Disease Control  (CDC) for  adults born from [birth year] through 1965.   Completed  . Pneumonia vaccines  Completed  *Topic was postponed. The date shown is not the original due date.   Preventive Care for Adults  A healthy lifestyle and preventive care can promote health and wellness. Preventive health guidelines for adults include the following key practices.  . A routine yearly physical is a good way to check with your health care provider about your health and preventive screening. It is a chance to share any concerns and updates on your health and to receive a thorough exam.  . Visit your dentist for a routine exam and preventive care every 6 months. Brush your teeth twice a day and floss once a day. Good oral hygiene prevents tooth decay and gum disease.  . The frequency of eye exams is based on your age, health, family medical history, use  of contact lenses, and other factors. Follow your health care provider's recommendations for frequency of eye exams.  . Eat a healthy diet. Foods like vegetables, fruits, whole grains, low-fat dairy  products, and lean protein foods contain the nutrients you need without too many calories. Decrease your intake of foods high in solid fats, added sugars, and salt. Eat the right amount of calories for you. Get information about a proper diet from your health care provider, if necessary.  . Regular physical exercise is one of the most important things you can do for your health. Most adults should get at least 150 minutes of moderate-intensity exercise (any activity that increases your heart rate and causes you to sweat) each week. In addition, most adults need muscle-strengthening exercises on 2 or more days a week.  Silver Sneakers may be a benefit available to you. To determine eligibility, you may visit the website: www.silversneakers.com or contact program at 517-674-5844 Mon-Fri between 8AM-8PM.   . Maintain a healthy weight. The body mass index (BMI) is a screening tool to identify possible weight problems. It provides an estimate of body fat based on height and weight. Your health care provider can find your BMI and can help you achieve or maintain a healthy weight.   For adults 20 years and older: ? A BMI below 18.5 is considered underweight. ? A BMI of 18.5 to 24.9 is normal. ? A BMI of 25 to 29.9 is considered overweight. ? A BMI of 30 and above is considered obese.   . Maintain normal blood lipids and  cholesterol levels by exercising and minimizing your intake of saturated fat. Eat a balanced diet with plenty of fruit and vegetables. Blood tests for lipids and cholesterol should begin at age 38 and be repeated every 5 years. If your lipid or cholesterol levels are high, you are over 50, or you are at high risk for heart disease, you may need your cholesterol levels checked more frequently. Ongoing high lipid and cholesterol levels should be treated with medicines if diet and exercise are not working.  . If you smoke, find out from your health care provider how to quit. If you do not  use tobacco, please do not start.  . If you choose to drink alcohol, please do not consume more than 2 drinks per day. One drink is considered to be 12 ounces (355 mL) of beer, 5 ounces (148 mL) of wine, or 1.5 ounces (44 mL) of liquor.  . If you are 20-62 years old, ask your health care provider if you should take aspirin to prevent strokes.  . Use sunscreen. Apply sunscreen liberally and repeatedly throughout the day. You should seek shade when your shadow is shorter than you. Protect yourself by wearing long sleeves, pants, a wide-brimmed hat, and sunglasses year round, whenever you are outdoors.  . Once a month, do a whole body skin exam, using a mirror to look at the skin on your back. Tell your health care provider of new moles, moles that have irregular borders, moles that are larger than a pencil eraser, or moles that have changed in shape or color.

## 2018-02-15 NOTE — Progress Notes (Signed)
PCP notes:   Health maintenance:  Foot exam - PCP follow-up needed A1C - completed Microalbumin - completed  Abnormal screenings:   Hearing - failed  Hearing Screening   125Hz  250Hz  500Hz  1000Hz  2000Hz  3000Hz  4000Hz  6000Hz  8000Hz   Right ear:   40 40 40  0    Left ear:   40 40 40  0     Patient concerns:   None  Nurse concerns:  None  Next PCP appt:   02/22/18 @ 1100

## 2018-02-15 NOTE — Progress Notes (Signed)
Subjective:   Kelsey Weaver is a 72 y.o. female who presents for Medicare Annual (Subsequent) preventive examination.  Review of Systems:  N/A Cardiac Risk Factors include: advanced age (>3men, >61 women);dyslipidemia;obesity (BMI >30kg/m2)     Objective:     Vitals: BP 110/70 (BP Location: Left Arm, Patient Position: Sitting, Cuff Size: Normal)   Pulse 75   Temp 98 F (36.7 C) (Oral)   Ht 5' 5.25" (1.657 m) Comment: no shoes  Wt 195 lb 8 oz (88.7 kg)   SpO2 98%   BMI 32.28 kg/m   Body mass index is 32.28 kg/m.  Advanced Directives 02/15/2018 08/02/2017 08/02/2017 07/19/2017 12/03/2016 09/01/2016  Does Patient Have a Medical Advance Directive? No No Yes No No No  Does patient want to make changes to medical advance directive? - No - Patient declined No - Patient declined - - -  Would patient like information on creating a medical advance directive? Yes (MAU/Ambulatory/Procedural Areas - Information given) - No - Patient declined Yes (MAU/Ambulatory/Procedural Areas - Information given) Yes (MAU/Ambulatory/Procedural Areas - Information given) -    Tobacco Social History   Tobacco Use  Smoking Status Never Smoker  Smokeless Tobacco Never Used     Counseling given: No   Clinical Intake:  Pre-visit preparation completed: Yes  Pain : 0-10 Pain Score: 5  Pain Type: Acute pain Pain Location: Knee Pain Orientation: Left Pain Onset: In the past 7 days Pain Frequency: Intermittent     Nutritional Risks: None Diabetes: Yes CBG done?: No Did pt. bring in CBG monitor from home?: No  How often do you need to have someone help you when you read instructions, pamphlets, or other written materials from your doctor or pharmacy?: 1 - Never What is the last grade level you completed in school?: RN diploma (3 yrs college)  Interpreter Needed?: No  Comments: pt lives with spouse Information entered by :: LPinson, LPN  Past Medical History:  Diagnosis Date  . Cardiac  arrhythmia due to congenital heart disease   . Diabetes mellitus without complication ([place])   . Diverticulosis   . DJD (degenerative joint disease)    frozen shoulder  . 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 GERD (gastroesophageal reflux disease)   . History of colonic polyps   . Hyperlipemia   . Overweight   . Paroxysmal atrial fibrillation ([place]) 09/2000   Past Surgical History:  Procedure Laterality Date  . ABLATION  10/31/2007, 08/26/2008, 09/22/2012, 07/01/2015   AFib ablation x 4 in [place]  . CATARACT EXTRACTION Bilateral   . COLONOSCOPY    . CYSTECTOMY     subsebacous x 2  . DILATION AND CURETTAGE OF UTERUS  at age 83  . FOOT SURGERY Bilateral 2008, 2014   4 hammertoes and bunionectomy  . implantable loop recorder pacement  01/19/13   MDT Reveal LINQ implanted in [place] for afib management  . KNEE SURGERY Left 2016   arthoscopy   . LIGATION / DIVISION SAPHENOUS VEIN Bilateral   . POLYPECTOMY    . REVERSE SHOULDER ARTHROPLASTY Left 08/02/2017   Procedure: TOTAL SHOULDER ARTHROPLASTY VS. REVERSE;  Surgeon: Corky Mull, MD;  Location: [facility] ORS;  Service: Orthopedics;  Laterality: Left;   Family History  Problem Relation Age of Onset  . Hypertension Mother   . Glaucoma Mother   . CAD Father   . Atrial fibrillation Father   . CAD Brother   . Hypertension Brother   . Gallbladder  disease Sister   . Atrial fibrillation Brother   . Hypercholesterolemia Brother   . Cholelithiasis Sister   . Colon cancer Neg Hx    Social History   Socioeconomic History  . Marital status: Married    Spouse name: Not on file  . Number of children: 3  . Years of education: Not on file  . Highest education level: Not on file  Occupational History  . Occupation: Retired Animal nutritionist  . Financial resource strain: Not on file  . Food insecurity:    Worry: Not on file    Inability: Not on file  . Transportation needs:    Medical: Not on file    Non-medical: Not on file    Tobacco Use  . Smoking status: Never Smoker  . Smokeless tobacco: Never Used  Substance and Sexual Activity  . Alcohol use: No    Alcohol/week: 0.0 standard drinks  . Drug use: No  . Sexual activity: Not on file  Lifestyle  . Physical activity:    Days per week: Not on file    Minutes per session: Not on file  . Stress: Not on file  Relationships  . Social connections:    Talks on phone: Not on file    Gets together: Not on file    Attends religious service: Not on file    Active member of club or organization: Not on file    Attends meetings of clubs or organizations: Not on file    Relationship status: Not on file  Other Topics Concern  . 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    Once worked in L&D and NICU.    Son is a Marine scientist at [employer] and also works for [employer] .   Enjoys spending time with her family.     Outpatient Encounter Medications as of 02/15/2018  Medication Sig  . aspirin EC 325 MG tablet Take 1 tablet (325 mg total) by mouth daily.  Marland Kitchen atenolol (TENORMIN) 25 MG tablet TAKE 1 TABLET (25 MG TOTAL) BY MOUTH AS NEEDED.  . cholecalciferol (VITAMIN D) 1000 units tablet Take 1,000 Units by mouth daily.  . famotidine (PEPCID AC MAXIMUM STRENGTH) 20 MG tablet Take 20 mg by mouth at bedtime.   . flecainide (TAMBOCOR) 50 MG tablet TAKE 1 TABLET BY MOUTH TWICE A DAY AS NEEDED FOR AFIB  . Glucosamine-Chondroit-Vit C-Mn (GLUCOSAMINE 1500 COMPLEX) CAPS Take 1 capsule by mouth 2 (two) times daily.  Marland Kitchen ibuprofen (ADVIL,MOTRIN) 800 MG tablet Take 800 mg by mouth as needed.   . metFORMIN (GLUCOPHAGE-XR) 500 MG 24 hr tablet TAKE 1 TABLET BY MOUTH EVERY DAY WITH BREAKFAST  . Multiple Vitamins-Minerals (EYE VITAMINS & MINERALS) TABS Take 1 tablet by mouth daily. Vision Health otc supplement  . OVER THE COUNTER MEDICATION Take 1 capsule by mouth daily. MegaRed Omega-3 Krill Oil 1,000 mg-230 mg-60 mg  . OVER THE  COUNTER MEDICATION Take 1 capsule by mouth daily. Herbalife Thermo Bond  . OVER THE COUNTER MEDICATION Take 1 capsule by mouth 3 (three) times daily. Herbalife Total Control  . OVER THE COUNTER MEDICATION Take 1 capsule by mouth 2 (two) times daily. Herbalife Aminogen  . OVER THE COUNTER MEDICATION Take 1 capsule by mouth 2 (two) times daily. Herbalife Cell Activator  . OVER THE COUNTER MEDICATION Take 1 tablet by mouth 2 (two) times daily. Herbalife Chief Executive Officer  .  OVER THE COUNTER MEDICATION Take 1 tablet by mouth 3 (three) times daily. Herbalife Cell-U-Loss  . pravastatin (PRAVACHOL) 20 MG tablet TAKE ONE TABLET BY MOUTH ONCE DAILY (Patient taking differently: TAKE ONE TABLET BY MOUTH ONCE DAILY AT BEDTIME)  . traMADol (ULTRAM) 50 MG tablet Take 1-2 tablets (50-100 mg total) by mouth every 6 (six) hours as needed for moderate pain.  Marland Kitchen triamterene-hydrochlorothiazide (MAXZIDE-25) 37.5-25 MG tablet Take 0.5 tablets by mouth daily as needed ([place]).   . Turmeric 500 MG TABS Take 500 mg by mouth daily.  . [DISCONTINUED] ALPRAZolam (XANAX) 0.5 MG tablet Take 0.5 mg by mouth at bedtime as needed for sleep.   . [DISCONTINUED] DULoxetine (CYMBALTA) 20 MG capsule Take 1 capsule (20 mg total) by mouth at bedtime.  . [DISCONTINUED] gabapentin (NEURONTIN) 300 MG capsule Take 1 capsule (300 mg total) by mouth 3 (three) times daily.  . [DISCONTINUED] HYDROcodone-acetaminophen (NORCO/VICODIN) 5-325 MG tablet Take 1-2 tablets by mouth every 4 (four) hours as needed for moderate pain.  . [DISCONTINUED] Multiple Vitamin (MULTIVITAMIN) tablet Take 1 tablet by mouth daily.  . [DISCONTINUED] traZODone (DESYREL) 50 MG tablet Take 1 tablet (50 mg total) by mouth at bedtime as needed for sleep. (Patient taking differently: Take 25-50 mg by mouth at bedtime. )   Facility-Administered Encounter Medications as of 02/15/2018  Medication  . 0.9 %  sodium chloride infusion    Activities of Daily Living In your present  state of health, do you have any difficulty performing the following activities: 02/15/2018 08/02/2017  Hearing? N -  Vision? N -  Difficulty concentrating or making decisions? N -  Walking or climbing stairs? N -  Dressing or bathing? N -  Doing errands, shopping? N N  Preparing Food and eating ? N -  Using the Toilet? N -  In the past six months, have you accidently leaked urine? N -  Do you have problems with loss of bowel control? N -  Managing your Medications? N -  Managing your Finances? N -  Housekeeping or managing your Housekeeping? N -  Some recent data might be hidden    Patient Care Team: Pleas Koch, NP as PCP - General (Internal Medicine) Berenice Primas, MD as Referring Physician (Orthopedic Surgery)    Assessment:   This is a routine wellness examination for [facility].   Hearing Screening   125Hz  250Hz  500Hz  1000Hz  2000Hz  3000Hz  4000Hz  6000Hz  8000Hz   Right ear:   40 40 40  0    Left ear:   40 40 40  0    Vision Screening Comments: Vision exam in Sept 2019 with Dr. Kerin Ransom    Exercise Activities and Dietary recommendations Current Exercise Habits: Home exercise routine, Type of exercise: walking, Time (Minutes): 60, Frequency (Times/Week): 7, Weekly Exercise (Minutes/Week): 420, Intensity: Mild, Exercise limited by: None identified  Goals    . Increase physical activity     Starting 02/15/2018, I will continue to walk 2-3 miles daily.        Fall Risk Fall Risk  02/15/2018 12/03/2016  Falls in the past year? No No   Depression Screen PHQ 2/9 Scores 02/15/2018 12/03/2016  PHQ - 2 Score 0 0  PHQ- 9 Score 0 -     Cognitive Function MMSE - Mini Mental State Exam 02/15/2018 12/03/2016  Orientation to time 5 5  Orientation to Place 5 5  Registration 3 3  Attention/ Calculation 0 0  Recall 3 3  Language- name 2 objects 0  0  Language- repeat 1 1  Language- follow 3 step command 3 3  Language- read & follow direction 0 0  Write a sentence 0 0  Copy  design 0 0  Total score 20 20       PLEASE NOTE: A Mini-Cog screen was completed. Maximum score is 20. A value of 0 denotes this part of [place] MMSE was not completed or the patient failed this part of the Mini-Cog screening.   Mini-Cog Screening Orientation to Time - Max 5 pts Orientation to Place - Max 5 pts Registration - Max 3 pts Recall - Max 3 pts Language Repeat - Max 1 pts Language Follow 3 Step Command - Max 3 pts   Immunization History  Administered Date(s) Administered  . Influenza, High Dose Seasonal PF 02/17/2016, 02/25/2017  . Influenza,inj,Quad PF,6+ Mos 02/15/2018  . Pneumococcal Conjugate-13 05/10/2014  . Pneumococcal Polysaccharide-23 02/13/2015  . Tdap 05/10/2010  . Zoster 06/10/2014    Screening Tests Health Maintenance  Topic Date Due  . FOOT EXAM  02/22/2018 (Originally 02/21/1956)  . HEMOGLOBIN A1C  08/17/2018  . OPHTHALMOLOGY EXAM  01/27/2019  . URINE MICROALBUMIN  02/16/2019  . MAMMOGRAM  06/08/2019  . TETANUS/TDAP  05/10/2020  . COLONOSCOPY  09/17/2021  . INFLUENZA VACCINE  Completed  . DEXA SCAN  Completed  . Hepatitis C Screening  Completed  . PNA vac Low Risk Adult  Completed      Plan:   I have personally reviewed, addressed, and noted the following in the patient's chart:  A. Medical and social history B. Use of alcohol, tobacco or illicit drugs  C. Current medications and supplements D. Functional ability and status E.  Nutritional status F.  Physical activity G. Advance directives H. List of other physicians I.  Hospitalizations, surgeries, and ER visits in previous 12 months J.  [place] to include hearing, vision, cognitive, depression L. Referrals and appointments - none  In addition, I have reviewed and discussed with patient certain preventive protocols, quality metrics, and best practice recommendations. A written personalized care plan for preventive services as well as general preventive health  recommendations were provided to patient.  See attached scanned questionnaire for additional information.   Signed,   Lindell Noe, MHA, BS, LPN Health Coach

## 2018-02-17 NOTE — Progress Notes (Signed)
I reviewed health advisor's note, was available for consultation, and agree with documentation and plan.  

## 2018-02-22 ENCOUNTER — Ambulatory Visit (INDEPENDENT_AMBULATORY_CARE_PROVIDER_SITE_OTHER): Payer: Medicare Other | Admitting: Primary Care

## 2018-02-22 DIAGNOSIS — G4700 Insomnia, unspecified: Secondary | ICD-10-CM

## 2018-02-22 DIAGNOSIS — I480 Paroxysmal atrial fibrillation: Secondary | ICD-10-CM | POA: Diagnosis not present

## 2018-02-22 DIAGNOSIS — E119 Type 2 diabetes mellitus without complications: Secondary | ICD-10-CM | POA: Diagnosis not present

## 2018-02-22 DIAGNOSIS — Z96612 Presence of left artificial shoulder joint: Secondary | ICD-10-CM | POA: Diagnosis not present

## 2018-02-22 DIAGNOSIS — E7849 Other hyperlipidemia: Secondary | ICD-10-CM | POA: Diagnosis not present

## 2018-02-22 NOTE — Progress Notes (Signed)
Subjective:    Patient ID: Kelsey Weaver, Weaver    DOB: 1946/03/06, 72 y.o.   MRN: [id number]  HPI  Kelsey Weaver who presents today for [place] part 2. She saw our health advisor last week.  1) Type 2 Diabetes:  Current medications include: Metformin ER 500 mg daily.  Last A1C: 5.9 in October 2019, 7.3 in February 2019 Last Eye Exam: Due in September 2020 Last Foot Exam: Due today Pneumonia Vaccination: Completed in 2016 ACE/ARB: Urine microalbumin negative in October 2019 Statin: Pravastatin   Diet currently consists of:  Breakfast: Protein drink Lunch: Salad  Dinner: Meat, vegetable  Snacks: None Desserts: None Beverages: Water, diet soda  Exercise: She is walking 2-3 miles 6 days weekly on average   Wt Readings from Last 3 Encounters:  02/15/18 195 lb 8 oz (88.7 kg)  08/02/17 220 lb 3.8 oz (99.9 kg)  07/29/17 220 lb 3.2 oz (99.9 kg)     2) Atrial Fibrillation: Followed by cardiology and is managed on flecainide 50 mg BID, atenolol 25 mg. She denies palpitations, dizziness, chest pain.  3) Essential Hypertension: Currently managed on triamterene-HCTZ 37.5-25 mg, and atenolol.   BP Readings from Last 3 Encounters:  02/22/18 118/72  02/15/18 110/70  08/03/17 129/68     Immunizations: -Tetanus: Completed in 2012 -Influenza: Completed this season -Pneumonia: Completed in 2016 -Shingles: Completed in 2016  Colonoscopy: Completed in 2018 Dexa: Completed in 2018 Mammogram: Completed in February 2019 Hep C Screen: Negative in 2017   Review of Systems  Constitutional: Negative for unexpected weight change.  HENT: Negative for rhinorrhea.   Respiratory: Negative for cough and shortness of breath.   Cardiovascular: Negative for chest pain and palpitations.  Gastrointestinal: Negative for constipation and diarrhea.  Genitourinary: Negative for difficulty urinating and menstrual problem.  Musculoskeletal: Negative for arthralgias and  myalgias.  Skin: Negative for rash.  Neurological: Negative for dizziness, numbness and headaches.  Psychiatric/Behavioral: The patient is not nervous/anxious.        Past Medical History:  Diagnosis Date  . Cardiac arrhythmia due to congenital heart disease   . Diabetes mellitus without complication ([place])   . Diverticulosis   . DJD (degenerative joint disease)    frozen shoulder  . 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 GERD (gastroesophageal reflux disease)   . History of colonic polyps   . Hyperlipemia   . Overweight   . Paroxysmal atrial fibrillation ([place]) 09/2000     Social History   Socioeconomic History  . Marital status: Married    Spouse name: Not on file  . Number of children: 3  . Years of education: Not on file  . Highest education level: Not on file  Occupational History  . Occupation: Retired Animal nutritionist  . Financial resource strain: Not on file  . Food insecurity:    Worry: Not on file    Inability: Not on file  . Transportation needs:    Medical: Not on file    Non-medical: Not on file  Tobacco Use  . Smoking status: Never Smoker  . Smokeless tobacco: Never Used  Substance and Sexual Activity  . Alcohol use: No    Alcohol/week: 0.0 standard drinks  . Drug use: No  . Sexual activity: Not on file  Lifestyle  . Physical activity:    Days per week: Not on file    Minutes per session: Not on file  .  Stress: Not on file  Relationships  . Social connections:    Talks on phone: Not on file    Gets together: Not on file    Attends religious service: Not on file    Active member of club or organization: Not on file    Attends meetings of clubs or organizations: Not on file    Relationship status: Not on file  . Intimate partner violence:    Fear of current or ex partner: Not on file    Emotionally abused: Not on file    Physically abused: Not on file    Forced sexual activity: Not on file  Other Topics Concern  . 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    Once worked in L&D and NICU.    Son is a Marine scientist at [employer] and also works for [employer] .   Enjoys spending time with her family.     Past Surgical History:  Procedure Laterality Date  . ABLATION  10/31/2007, 08/26/2008, 09/22/2012, 07/01/2015   AFib ablation x 4 in [place]  . CATARACT EXTRACTION Bilateral   . COLONOSCOPY    . CYSTECTOMY     subsebacous x 2  . DILATION AND CURETTAGE OF UTERUS  at age 76  . FOOT SURGERY Bilateral 2008, 2014   4 hammertoes and bunionectomy  . implantable loop recorder pacement  01/19/13   MDT Reveal LINQ implanted in [place] for afib management  . KNEE SURGERY Left 2016   arthoscopy   . LIGATION / DIVISION SAPHENOUS VEIN Bilateral   . POLYPECTOMY    . REVERSE SHOULDER ARTHROPLASTY Left 08/02/2017   Procedure: TOTAL SHOULDER ARTHROPLASTY VS. REVERSE;  Surgeon: Corky Mull, MD;  Location: [facility] ORS;  Service: Orthopedics;  Laterality: Left;    Family History  Problem Relation Age of Onset  . Hypertension Mother   . Glaucoma Mother   . CAD Father   . Atrial fibrillation Father   . CAD Brother   . Hypertension Brother   . Gallbladder disease Sister   . Atrial fibrillation Brother   . Hypercholesterolemia Brother   . Cholelithiasis Sister   . Colon cancer Neg Hx     Allergies  Allergen Reactions  . Ambien [Zolpidem Tartrate] Other (See Comments)    Flu like symptoms   . Compazine [Prochlorperazine Edisylate] Other (See Comments)    Aches and pains, generalized  . Metoprolol Other (See Comments)    Depression   . Reglan [Metoclopramide] Anxiety    Current Outpatient Medications on File Prior to Visit  Medication Sig Dispense Refill  . pravastatin (PRAVACHOL) 20 MG tablet TAKE ONE TABLET BY MOUTH ONCE DAILY (Patient taking differently: TAKE ONE TABLET BY MOUTH ONCE DAILY AT BEDTIME) 90 tablet 3  . traZODone (DESYREL) 50 MG tablet Take 50 mg by mouth  at bedtime as needed.     . triamterene-hydrochlorothiazide (MAXZIDE-25) 37.5-25 MG tablet Take 0.5 tablets by mouth daily as needed ([place]).     . Turmeric 500 MG TABS Take 500 mg by mouth daily.    Marland Kitchen aspirin EC 325 MG tablet Take 1 tablet (325 mg total) by mouth daily. 30 tablet 0  . atenolol (TENORMIN) 25 MG tablet TAKE 1 TABLET (25 MG TOTAL) BY MOUTH AS NEEDED. 30 tablet 9  . cholecalciferol (VITAMIN D) 1000 units tablet Take 1,000 Units by mouth daily.    . famotidine (PEPCID  AC MAXIMUM STRENGTH) 20 MG tablet Take 20 mg by mouth at bedtime.     . flecainide (TAMBOCOR) 50 MG tablet TAKE 1 TABLET BY MOUTH TWICE A DAY AS NEEDED FOR AFIB 180 tablet 3  . Glucosamine-Chondroit-Vit C-Mn (GLUCOSAMINE 1500 COMPLEX) CAPS Take 1 capsule by mouth 2 (two) times daily.    Marland Kitchen ibuprofen (ADVIL,MOTRIN) 800 MG tablet Take 800 mg by mouth as needed.     . metFORMIN (GLUCOPHAGE-XR) 500 MG 24 hr tablet TAKE 1 TABLET BY MOUTH EVERY DAY WITH BREAKFAST 90 tablet 1  . Multiple Vitamins-Minerals (EYE VITAMINS & MINERALS) TABS Take 1 tablet by mouth daily. [employer] otc supplement     Current Facility-Administered Medications on File Prior to Visit  Medication Dose Route Frequency Provider Last Rate Last Dose  . 0.9 %  sodium chloride infusion  500 mL Intravenous Continuous Milus Banister, MD        BP 118/72   Pulse 69   Temp 98.1 F (36.7 C) (Oral)   SpO2 96%    Objective:   Physical Exam  Constitutional: She is oriented to person, place, and time. She appears well-nourished.  HENT:  Mouth/Throat: No oropharyngeal exudate.  Eyes: Pupils are equal, round, and reactive to light. EOM are normal.  Neck: Neck supple. No thyromegaly present.  Cardiovascular: Normal rate and regular rhythm.  Respiratory: Effort normal and breath sounds normal.  GI: Soft. Bowel sounds are normal. There is no tenderness.  Musculoskeletal: Normal range of motion.  Neurological: She is alert and oriented to person, place,  and time.  Skin: Skin is warm and dry.  Psychiatric: She has a normal mood and affect.           Assessment & Plan:

## 2018-02-22 NOTE — Assessment & Plan Note (Signed)
LDL at goal.  Continue pravastatin. 

## 2018-02-22 NOTE — Assessment & Plan Note (Signed)
Significant improvement in A1C after weight loss of 25 pounds through lifestyle changes. Commended her on this success.  Foot exam today. Eye exam UTD. Pneumonia vaccination UTD. Managed on statin. Urine microalbumin negative.  Follow up in 6 months. Continue Metformin.

## 2018-02-22 NOTE — Assessment & Plan Note (Signed)
Overall doing well on Trazodone, taking 1-2 tablets on average. Continue same.

## 2018-02-22 NOTE — Patient Instructions (Addendum)
Start exercising. You should be getting 150 minutes of moderate intensity exercise weekly.  Continue to work on a healthy diet, congratulations on your weight loss!  Please schedule a follow up appointment in 6 months for diabetes check.   It was a pleasure to see you today!

## 2018-02-22 NOTE — Assessment & Plan Note (Signed)
Doing well since surgery. Following with orthopedics also for knee and hips.

## 2018-02-22 NOTE — Assessment & Plan Note (Signed)
Rate and rhythm regular today. Following with cardiology annually. Continue current regimen.

## 2018-02-24 MED ORDER — TRAZODONE HCL 50 MG PO TABS: ORAL_TABLET | ORAL | 2 refills | Status: DC

## 2018-03-01 ENCOUNTER — Other Ambulatory Visit: Payer: Self-pay | Admitting: Student

## 2018-03-01 DIAGNOSIS — G8929 Other chronic pain: Secondary | ICD-10-CM

## 2018-03-01 DIAGNOSIS — M25562 Pain in left knee: Secondary | ICD-10-CM

## 2018-03-01 DIAGNOSIS — M1712 Unilateral primary osteoarthritis, left knee: Secondary | ICD-10-CM

## 2018-03-01 DIAGNOSIS — Z9889 Other specified postprocedural states: Secondary | ICD-10-CM

## 2018-03-01 DIAGNOSIS — M7062 Trochanteric bursitis, left hip: Secondary | ICD-10-CM

## 2018-03-01 DIAGNOSIS — S76012A Strain of muscle, fascia and tendon of left hip, initial encounter: Secondary | ICD-10-CM

## 2018-03-13 ENCOUNTER — Encounter: Payer: Medicare Other | Admitting: Primary Care

## 2018-03-21 ENCOUNTER — Other Ambulatory Visit: Payer: Self-pay | Admitting: Primary Care

## 2018-03-22 ENCOUNTER — Ambulatory Visit
Admission: RE | Admit: 2018-03-22 | Discharge: 2018-03-22 | Disposition: A | Discharge status: Home / Self Care | Payer: Medicare Other | Source: Ambulatory Visit | Attending: Student | Admitting: Student

## 2018-03-22 DIAGNOSIS — S83282A Other tear of lateral meniscus, current injury, left knee, initial encounter: Secondary | ICD-10-CM | POA: Insufficient documentation

## 2018-03-22 DIAGNOSIS — M25562 Pain in left knee: Secondary | ICD-10-CM | POA: Diagnosis not present

## 2018-03-22 DIAGNOSIS — M7061 Trochanteric bursitis, right hip: Secondary | ICD-10-CM | POA: Diagnosis not present

## 2018-03-22 DIAGNOSIS — M7062 Trochanteric bursitis, left hip: Secondary | ICD-10-CM

## 2018-03-22 DIAGNOSIS — G8929 Other chronic pain: Secondary | ICD-10-CM | POA: Diagnosis not present

## 2018-03-22 DIAGNOSIS — X58XXXA Exposure to other specified factors, initial encounter: Secondary | ICD-10-CM | POA: Insufficient documentation

## 2018-03-22 DIAGNOSIS — S83222A Peripheral tear of medial meniscus, current injury, left knee, initial encounter: Secondary | ICD-10-CM | POA: Insufficient documentation

## 2018-03-22 DIAGNOSIS — M1712 Unilateral primary osteoarthritis, left knee: Secondary | ICD-10-CM

## 2018-03-22 DIAGNOSIS — Z9889 Other specified postprocedural states: Secondary | ICD-10-CM | POA: Diagnosis not present

## 2018-03-22 DIAGNOSIS — S76012A Strain of muscle, fascia and tendon of left hip, initial encounter: Secondary | ICD-10-CM

## 2018-03-22 DIAGNOSIS — M1612 Unilateral primary osteoarthritis, left hip: Secondary | ICD-10-CM | POA: Diagnosis not present

## 2018-03-27 ENCOUNTER — Other Ambulatory Visit: Payer: Self-pay

## 2018-04-10 DIAGNOSIS — S76012A Strain of muscle, fascia and tendon of left hip, initial encounter: Secondary | ICD-10-CM | POA: Insufficient documentation

## 2018-04-10 DIAGNOSIS — M1712 Unilateral primary osteoarthritis, left knee: Secondary | ICD-10-CM | POA: Insufficient documentation

## 2018-04-10 DIAGNOSIS — S83272A Complex tear of lateral meniscus, current injury, left knee, initial encounter: Secondary | ICD-10-CM | POA: Insufficient documentation

## 2018-04-10 DIAGNOSIS — S83232A Complex tear of medial meniscus, current injury, left knee, initial encounter: Secondary | ICD-10-CM | POA: Diagnosis not present

## 2018-05-15 ENCOUNTER — Encounter: Payer: Self-pay | Admitting: Podiatry

## 2018-05-15 ENCOUNTER — Ambulatory Visit (INDEPENDENT_AMBULATORY_CARE_PROVIDER_SITE_OTHER): Payer: Medicare Other | Admitting: Podiatry

## 2018-05-15 ENCOUNTER — Ambulatory Visit (INDEPENDENT_AMBULATORY_CARE_PROVIDER_SITE_OTHER): Payer: Medicare Other

## 2018-05-15 DIAGNOSIS — R29898 Other symptoms and signs involving the musculoskeletal system: Secondary | ICD-10-CM

## 2018-05-15 DIAGNOSIS — M2580 Other specified joint disorders, unspecified joint: Secondary | ICD-10-CM | POA: Diagnosis not present

## 2018-05-15 DIAGNOSIS — M779 Enthesopathy, unspecified: Secondary | ICD-10-CM

## 2018-05-15 DIAGNOSIS — L909 Atrophic disorder of skin, unspecified: Secondary | ICD-10-CM

## 2018-05-15 DIAGNOSIS — M778 Other enthesopathies, not elsewhere classified: Secondary | ICD-10-CM

## 2018-05-15 NOTE — Progress Notes (Signed)
She presents today chief complaint of pain sub-first metatarsal phalangeal joint left where she had a fusion of the joint many years ago.  She states that previous surgery is starting to become a little more painful as of possibly a screw that is protruding through the area I would just like to have this checked before I have my left knee surgery done.  Objective: Vital signs are stable alert and oriented x3.  Pulses are palpable.  There is no erythema edema cellulitis drainage odor she has a palpable tibial sesamoid there easily palpable with a fused first metatarsophalangeal joint.  Radiographs demonstrate hypertrophic of the bone secondary to osteoarthritic changes of the tibial sesamoid.  Assessment: Tibial sesamoiditis with osteoarthritic change.  Plan: Follow-up with me on an as-needed basis.  We discussed the possible need for surgical intervention she declined.

## 2018-05-24 ENCOUNTER — Other Ambulatory Visit: Payer: Self-pay

## 2018-05-24 ENCOUNTER — Ambulatory Visit
Admission: RE | Admit: 2018-05-24 | Discharge: 2018-05-24 | Disposition: A | Discharge status: Home / Self Care | Payer: Medicare Other | Source: Ambulatory Visit | Attending: Surgery | Admitting: Surgery

## 2018-05-24 ENCOUNTER — Encounter
Admission: RE | Admit: 2018-05-24 | Discharge: 2018-05-24 | Disposition: A | Discharge status: Home / Self Care | Payer: Medicare Other | Source: Ambulatory Visit | Attending: Surgery | Admitting: Surgery

## 2018-05-24 DIAGNOSIS — Z01818 Encounter for other preprocedural examination: Secondary | ICD-10-CM | POA: Diagnosis not present

## 2018-05-24 DIAGNOSIS — Z01810 Encounter for preprocedural cardiovascular examination: Secondary | ICD-10-CM | POA: Diagnosis not present

## 2018-05-24 DIAGNOSIS — Z96651 Presence of right artificial knee joint: Secondary | ICD-10-CM | POA: Diagnosis not present

## 2018-05-24 DIAGNOSIS — I10 Essential (primary) hypertension: Secondary | ICD-10-CM | POA: Diagnosis not present

## 2018-05-24 DIAGNOSIS — Z471 Aftercare following joint replacement surgery: Secondary | ICD-10-CM | POA: Diagnosis not present

## 2018-05-24 HISTORY — DX: Essential (primary) hypertension: I10

## 2018-05-24 LAB — URINALYSIS, ROUTINE W REFLEX MICROSCOPIC
Bilirubin Urine: NEGATIVE
Glucose, UA: NEGATIVE mg/dL
Hgb urine dipstick: NEGATIVE
Ketones, ur: NEGATIVE mg/dL
Leukocytes, UA: NEGATIVE
Nitrite: NEGATIVE
Protein, ur: NEGATIVE mg/dL
Specific Gravity, Urine: 1.016 (ref 1.005–1.030)
pH: 5 (ref 5.0–8.0)

## 2018-05-24 LAB — CBC
HCT: 45.4 % (ref 36.0–46.0)
Hemoglobin: 14.8 g/dL (ref 12.0–15.0)
MCH: 30.3 pg (ref 26.0–34.0)
MCHC: 32.6 g/dL (ref 30.0–36.0)
MCV: 93 fL (ref 80.0–100.0)
Platelets: 372 10*3/uL (ref 150–400)
RBC: 4.88 MIL/uL (ref 3.87–5.11)
RDW: 12.6 % (ref 11.5–15.5)
WBC: 8.2 10*3/uL (ref 4.0–10.5)
nRBC: 0 % (ref 0.0–0.2)

## 2018-05-24 LAB — BASIC METABOLIC PANEL
Anion gap: 9 (ref 5–15)
BUN: 22 mg/dL (ref 8–23)
CO2: 29 mmol/L (ref 22–32)
Calcium: 9.5 mg/dL (ref 8.9–10.3)
Chloride: 101 mmol/L (ref 98–111)
Creatinine, Ser: 0.66 mg/dL (ref 0.44–1.00)
GFR calc Af Amer: >60 mL/min (ref >60)
GFR calc non Af Amer: >60 mL/min (ref >60)
Glucose, Bld: 94 mg/dL (ref 70–99)
Potassium: 3.6 mmol/L (ref 3.5–5.1)
Sodium: 139 mmol/L (ref 135–145)

## 2018-05-24 LAB — TYPE AND SCREEN
ABO/RH(D): O POS
Antibody Screen: NEGATIVE

## 2018-05-24 LAB — SURGICAL PCR SCREEN
MRSA, PCR: NEGATIVE
Staphylococcus aureus: NEGATIVE

## 2018-05-24 NOTE — Patient Instructions (Addendum)
Your procedure is scheduled on: Thursday, June 08, 2018 Report to Day Surgery on the 2nd floor of the [facility]. To find out your arrival time, please call [phone] between 1PM - 3PM on: Wednesday, June 07, 2018  REMEMBER: Instructions that are not followed completely may result in serious medical risk, up to and including death; or upon the discretion of your surgeon and anesthesiologist your surgery may need to be rescheduled.  Do not eat food after midnight the night before surgery.  No gum chewing, lozengers or hard candies.  You may however, drink water up to 2 hours before you are scheduled to arrive for your surgery. Do not drink anything within 2 hours of the start of your surgery.  No Alcohol for 24 hours before or after surgery.  No Smoking including e-cigarettes for 24 hours prior to surgery.  No chewable tobacco products for at least 6 hours prior to surgery.  No nicotine patches on the day of surgery.  On the morning of surgery brush your teeth with toothpaste and water, you may rinse your mouth with mouthwash if you wish. Do not swallow any toothpaste or mouthwash.  Notify your doctor if there is any change in your medical condition (cold, fever, infection).  Do not wear jewelry, make-up, hairpins, clips or nail polish.  Do not wear lotions, powders, or perfumes.   Do not shave 48 hours prior to surgery.   Contacts and dentures may not be worn into surgery.  Do not bring valuables to the hospital, including drivers license, insurance or credit cards.  [facility] is not responsible for any belongings or valuables.   TAKE THESE MEDICATIONS THE MORNING OF SURGERY:  1.  Atenolol 2.  Pepcid 3.  Flecainide 4.  Tramadol (if needed for pain)  Use CHG Soap as directed on instruction sheet.  Stop Metformin 2 days prior to surgery. Last day to take is Monday, January 27; resume after surgery.  STARTING January 23 - Stop Anti-inflammatories (NSAIDS) such  as Advil, Aleve, Ibuprofen, Motrin, Naproxen, Naprosyn and Aspirin based products such as Excedrin, Goodys Powder, BC Powder. (May take Tylenol or Acetaminophen if needed.)  STARTING January 23 - Stop ANY OVER THE COUNTER supplements until after surgery.  (KRILL OIL, TURMERIC) (May continue Vitamin D, Vitamin B, and multivitamin.)  Wear comfortable clothing (specific to your surgery type) to the hospital.  Plan for stool softeners for home use.  If you are being admitted to the hospital overnight, leave your suitcase in the car. After surgery it may be brought to your room.  Please call [phone] if you have any questions about these instructions.

## 2018-05-26 LAB — URINE CULTURE: Culture: NO GROWTH

## 2018-06-07 MED ORDER — CEFAZOLIN SODIUM-DEXTROSE 2-4 GM/100ML-% IV SOLN
2.0000 g | Freq: Once | INTRAVENOUS | Status: AC
  Administered 2018-06-08: 2 g via INTRAVENOUS

## 2018-06-08 ENCOUNTER — Encounter: Payer: Self-pay | Admitting: *Deleted

## 2018-06-08 ENCOUNTER — Inpatient Hospital Stay: Payer: Medicare Other | Admitting: Anesthesiology

## 2018-06-08 ENCOUNTER — Other Ambulatory Visit: Payer: Self-pay

## 2018-06-08 ENCOUNTER — Inpatient Hospital Stay
Admission: RE | Admit: 2018-06-08 | Discharge: 2018-06-10 | DRG: 470 | Disposition: A | Discharge status: Home Health | Payer: Medicare Other | Attending: Surgery | Admitting: Surgery

## 2018-06-08 ENCOUNTER — Inpatient Hospital Stay: Payer: Medicare Other

## 2018-06-08 ENCOUNTER — Encounter
Admission: RE | Disposition: A | Discharge status: Home Health | Payer: Self-pay | Source: Home / Self Care | Attending: Surgery

## 2018-06-08 DIAGNOSIS — K219 Gastro-esophageal reflux disease without esophagitis: Secondary | ICD-10-CM | POA: Diagnosis present

## 2018-06-08 DIAGNOSIS — Z96652 Presence of left artificial knee joint: Secondary | ICD-10-CM

## 2018-06-08 DIAGNOSIS — E119 Type 2 diabetes mellitus without complications: Secondary | ICD-10-CM | POA: Diagnosis not present

## 2018-06-08 DIAGNOSIS — Z471 Aftercare following joint replacement surgery: Secondary | ICD-10-CM | POA: Diagnosis not present

## 2018-06-08 DIAGNOSIS — Z96612 Presence of left artificial shoulder joint: Secondary | ICD-10-CM | POA: Diagnosis present

## 2018-06-08 DIAGNOSIS — E785 Hyperlipidemia, unspecified: Secondary | ICD-10-CM | POA: Diagnosis present

## 2018-06-08 DIAGNOSIS — I10 Essential (primary) hypertension: Secondary | ICD-10-CM | POA: Diagnosis present

## 2018-06-08 DIAGNOSIS — Z7984 Long term (current) use of oral hypoglycemic drugs: Secondary | ICD-10-CM | POA: Diagnosis not present

## 2018-06-08 DIAGNOSIS — M1712 Unilateral primary osteoarthritis, left knee: Principal | ICD-10-CM | POA: Diagnosis present

## 2018-06-08 HISTORY — PX: TOTAL KNEE ARTHROPLASTY: SHX125

## 2018-06-08 HISTORY — DX: Presence of left artificial knee joint: Z96.652

## 2018-06-08 LAB — GLUCOSE, CAPILLARY
Glucose-Capillary: 123 mg/dL — ABNORMAL HIGH (ref 70–99)
Glucose-Capillary: 122 mg/dL — ABNORMAL HIGH (ref 70–99)
Glucose-Capillary: 146 mg/dL — ABNORMAL HIGH (ref 70–99)
Glucose-Capillary: 135 mg/dL — ABNORMAL HIGH (ref 70–99)
Glucose-Capillary: 150 mg/dL — ABNORMAL HIGH (ref 70–99)

## 2018-06-08 LAB — TYPE AND SCREEN
ABO/RH(D): O POS
Antibody Screen: NEGATIVE

## 2018-06-08 SURGERY — ARTHROPLASTY, KNEE, TOTAL
Anesthesia: Spinal | Site: Knee | Laterality: Left

## 2018-06-08 MED ORDER — ACETAMINOPHEN 500 MG PO TABS
1000.0000 mg | ORAL_TABLET | Freq: Four times a day (QID) | ORAL | Status: AC
  Administered 2018-06-08 – 2018-06-09 (×3): 1000 mg via ORAL
  Filled 2018-06-08 (×4): qty 2

## 2018-06-08 MED ORDER — SODIUM CHLORIDE 0.9 % IV SOLN
INTRAVENOUS | Status: DC | PRN
  Administered 2018-06-08: 25 ug/min via INTRAVENOUS

## 2018-06-08 MED ORDER — ACETAMINOPHEN 10 MG/ML IV SOLN
INTRAVENOUS | Status: DC | PRN
  Administered 2018-06-08: 1000 mg via INTRAVENOUS

## 2018-06-08 MED ORDER — BUPIVACAINE HCL (PF) 0.5 % IJ SOLN
INTRAMUSCULAR | Status: DC | PRN
  Administered 2018-06-08: 3 mL

## 2018-06-08 MED ORDER — DEXAMETHASONE SODIUM PHOSPHATE 10 MG/ML IJ SOLN
INTRAMUSCULAR | Status: DC | PRN
  Administered 2018-06-08: 5 mg via INTRAVENOUS

## 2018-06-08 MED ORDER — ATENOLOL 25 MG PO TABS
25.0000 mg | ORAL_TABLET | Freq: Every day | ORAL | Status: DC
  Administered 2018-06-09 – 2018-06-10 (×2): 25 mg via ORAL
  Filled 2018-06-08 (×2): qty 1

## 2018-06-08 MED ORDER — MIDAZOLAM HCL 5 MG/5ML IJ SOLN
INTRAMUSCULAR | Status: DC | PRN
  Administered 2018-06-08: 2 mg via INTRAVENOUS

## 2018-06-08 MED ORDER — FENTANYL CITRATE (PF) 100 MCG/2ML IJ SOLN
INTRAMUSCULAR | Status: AC
  Administered 2018-06-08: 25 ug via INTRAVENOUS
  Filled 2018-06-08: qty 2

## 2018-06-08 MED ORDER — BISACODYL 10 MG RE SUPP: 10.0000 mg | Freq: Every day | RECTAL | Status: DC | PRN

## 2018-06-08 MED ORDER — VITAMIN B-12 1000 MCG PO TABS
500.0000 ug | ORAL_TABLET | Freq: Every day | ORAL | Status: DC
  Administered 2018-06-09 – 2018-06-10 (×2): 500 ug via ORAL

## 2018-06-08 MED ORDER — ACETAMINOPHEN 10 MG/ML IV SOLN
INTRAVENOUS | Status: AC
  Filled 2018-06-08: qty 100

## 2018-06-08 MED ORDER — HYDROMORPHONE HCL 1 MG/ML IJ SOLN
0.5000 mg | INTRAMUSCULAR | Status: AC | PRN
  Administered 2018-06-08 (×4): 0.5 mg via INTRAVENOUS

## 2018-06-08 MED ORDER — HYDROMORPHONE HCL 1 MG/ML IJ SOLN
INTRAMUSCULAR | Status: AC
  Administered 2018-06-08: 0.5 mg via INTRAVENOUS
  Filled 2018-06-08: qty 1

## 2018-06-08 MED ORDER — ONDANSETRON HCL 4 MG/2ML IJ SOLN
INTRAMUSCULAR | Status: DC | PRN
  Administered 2018-06-08: 4 mg via INTRAVENOUS

## 2018-06-08 MED ORDER — TRAZODONE HCL 100 MG PO TABS
100.0000 mg | ORAL_TABLET | Freq: Every day | ORAL | Status: DC
  Administered 2018-06-08 – 2018-06-09 (×2): 100 mg via ORAL
  Filled 2018-06-08 (×2): qty 1

## 2018-06-08 MED ORDER — ONDANSETRON HCL 4 MG/2ML IJ SOLN
INTRAMUSCULAR | Status: AC
  Filled 2018-06-08: qty 2

## 2018-06-08 MED ORDER — FAMOTIDINE 20 MG PO TABS
20.0000 mg | ORAL_TABLET | Freq: Every day | ORAL | Status: DC
  Administered 2018-06-08 – 2018-06-09 (×2): 20 mg via ORAL
  Filled 2018-06-08 (×2): qty 1

## 2018-06-08 MED ORDER — KETOROLAC TROMETHAMINE 15 MG/ML IJ SOLN
15.0000 mg | Freq: Four times a day (QID) | INTRAMUSCULAR | Status: AC
  Administered 2018-06-09: 15 mg via INTRAVENOUS
  Filled 2018-06-08 (×3): qty 1

## 2018-06-08 MED ORDER — PRAVASTATIN SODIUM 20 MG PO TABS
20.0000 mg | ORAL_TABLET | Freq: Every day | ORAL | Status: DC
  Administered 2018-06-08 – 2018-06-09 (×2): 20 mg via ORAL
  Filled 2018-06-08 (×2): qty 1

## 2018-06-08 MED ORDER — SODIUM CHLORIDE 0.9 % IV SOLN
INTRAVENOUS | Status: DC
  Administered 2018-06-08: 07:00:00 via INTRAVENOUS

## 2018-06-08 MED ORDER — SODIUM CHLORIDE 0.9 % IV SOLN
INTRAVENOUS | Status: DC | PRN
  Administered 2018-06-08: 60 mL

## 2018-06-08 MED ORDER — DEXAMETHASONE SODIUM PHOSPHATE 10 MG/ML IJ SOLN
INTRAMUSCULAR | Status: AC
  Filled 2018-06-08: qty 1

## 2018-06-08 MED ORDER — TRAMADOL HCL 50 MG PO TABS
50.0000 mg | ORAL_TABLET | Freq: Four times a day (QID) | ORAL | Status: DC
  Administered 2018-06-08 – 2018-06-10 (×7): 50 mg via ORAL
  Filled 2018-06-08 (×7): qty 1

## 2018-06-08 MED ORDER — ONDANSETRON HCL 4 MG/2ML IJ SOLN
4.0000 mg | Freq: Four times a day (QID) | INTRAMUSCULAR | Status: DC | PRN
  Administered 2018-06-08: 4 mg via INTRAVENOUS
  Filled 2018-06-08: qty 2

## 2018-06-08 MED ORDER — KETOROLAC TROMETHAMINE 30 MG/ML IJ SOLN
30.0000 mg | Freq: Once | INTRAMUSCULAR | Status: AC
  Administered 2018-06-08: 30 mg via INTRAVENOUS

## 2018-06-08 MED ORDER — SODIUM CHLORIDE FLUSH 0.9 % IV SOLN
INTRAVENOUS | Status: AC
  Filled 2018-06-08: qty 30

## 2018-06-08 MED ORDER — SODIUM CHLORIDE 0.9 % IV SOLN
INTRAVENOUS | Status: DC | PRN
  Administered 2018-06-08: 3000 mL

## 2018-06-08 MED ORDER — BUPIVACAINE HCL (PF) 0.5 % IJ SOLN
INTRAMUSCULAR | Status: AC
  Filled 2018-06-08: qty 10

## 2018-06-08 MED ORDER — PROPOFOL 500 MG/50ML IV EMUL
INTRAVENOUS | Status: AC
  Filled 2018-06-08: qty 50

## 2018-06-08 MED ORDER — FLECAINIDE ACETATE 50 MG PO TABS
50.0000 mg | ORAL_TABLET | Freq: Two times a day (BID) | ORAL | Status: DC
  Administered 2018-06-08 – 2018-06-10 (×4): 50 mg via ORAL
  Filled 2018-06-08 (×5): qty 1

## 2018-06-08 MED ORDER — SODIUM CHLORIDE 0.9 % IV SOLN
INTRAVENOUS | Status: DC
  Administered 2018-06-08 – 2018-06-09 (×2): via INTRAVENOUS

## 2018-06-08 MED ORDER — MIDAZOLAM HCL 2 MG/2ML IJ SOLN
INTRAMUSCULAR | Status: AC
  Filled 2018-06-08: qty 2

## 2018-06-08 MED ORDER — PROPOFOL 10 MG/ML IV BOLUS
INTRAVENOUS | Status: DC | PRN
  Administered 2018-06-08: 30 mg via INTRAVENOUS

## 2018-06-08 MED ORDER — LIDOCAINE HCL (CARDIAC) PF 100 MG/5ML IV SOSY
PREFILLED_SYRINGE | INTRAVENOUS | Status: DC | PRN
  Administered 2018-06-08: 80 mg via INTRAVENOUS

## 2018-06-08 MED ORDER — OXYCODONE HCL 5 MG PO TABS: 5.0000 mg | ORAL_TABLET | ORAL | Status: DC | PRN

## 2018-06-08 MED ORDER — INSULIN ASPART 100 UNIT/ML ~~LOC~~ SOLN
0.0000 [IU] | Freq: Three times a day (TID) | SUBCUTANEOUS | Status: DC
  Administered 2018-06-08 (×2): 2 [IU] via SUBCUTANEOUS
  Filled 2018-06-08 (×2): qty 1

## 2018-06-08 MED ORDER — TRANEXAMIC ACID 1000 MG/10ML IV SOLN
INTRAVENOUS | Status: AC
  Filled 2018-06-08: qty 10

## 2018-06-08 MED ORDER — FENTANYL CITRATE (PF) 100 MCG/2ML IJ SOLN
25.0000 ug | INTRAMUSCULAR | Status: AC | PRN
  Administered 2018-06-08 (×6): 25 ug via INTRAVENOUS

## 2018-06-08 MED ORDER — CEFAZOLIN SODIUM-DEXTROSE 2-4 GM/100ML-% IV SOLN
INTRAVENOUS | Status: AC
  Filled 2018-06-08: qty 100

## 2018-06-08 MED ORDER — BUPIVACAINE-EPINEPHRINE (PF) 0.5% -1:200000 IJ SOLN
INTRAMUSCULAR | Status: AC
  Filled 2018-06-08: qty 90

## 2018-06-08 MED ORDER — ONDANSETRON HCL 4 MG/2ML IJ SOLN: 4.0000 mg | Freq: Once | INTRAMUSCULAR | Status: DC | PRN

## 2018-06-08 MED ORDER — TURMERIC 500 MG PO TABS: 500.0000 mg | ORAL_TABLET | Freq: Every day | ORAL | Status: DC

## 2018-06-08 MED ORDER — LIDOCAINE HCL (PF) 2 % IJ SOLN
INTRAMUSCULAR | Status: AC
  Filled 2018-06-08: qty 10

## 2018-06-08 MED ORDER — METOCLOPRAMIDE HCL 10 MG PO TABS: 5.0000 mg | ORAL_TABLET | Freq: Three times a day (TID) | ORAL | Status: DC | PRN

## 2018-06-08 MED ORDER — BUPIVACAINE LIPOSOME 1.3 % IJ SUSP
INTRAMUSCULAR | Status: AC
  Filled 2018-06-08: qty 20

## 2018-06-08 MED ORDER — MAGNESIUM HYDROXIDE 400 MG/5ML PO SUSP
30.0000 mL | Freq: Every day | ORAL | Status: DC | PRN
  Administered 2018-06-10: 30 mL via ORAL
  Filled 2018-06-08: qty 30

## 2018-06-08 MED ORDER — TRIAMTERENE-HCTZ 37.5-25 MG PO TABS
0.5000 | ORAL_TABLET | Freq: Every day | ORAL | Status: DC | PRN
  Filled 2018-06-08: qty 0.5

## 2018-06-08 MED ORDER — HYDROMORPHONE HCL 1 MG/ML IJ SOLN: 0.2500 mg | INTRAMUSCULAR | Status: DC | PRN

## 2018-06-08 MED ORDER — CEFAZOLIN SODIUM-DEXTROSE 2-4 GM/100ML-% IV SOLN
2.0000 g | Freq: Four times a day (QID) | INTRAVENOUS | Status: AC
  Administered 2018-06-08 – 2018-06-09 (×3): 2 g via INTRAVENOUS
  Filled 2018-06-08 (×4): qty 100

## 2018-06-08 MED ORDER — ENOXAPARIN SODIUM 40 MG/0.4ML ~~LOC~~ SOLN
40.0000 mg | SUBCUTANEOUS | Status: DC
  Administered 2018-06-09 – 2018-06-10 (×2): 40 mg via SUBCUTANEOUS
  Filled 2018-06-08 (×2): qty 0.4

## 2018-06-08 MED ORDER — METFORMIN HCL ER 500 MG PO TB24
500.0000 mg | ORAL_TABLET | Freq: Every day | ORAL | Status: DC
  Administered 2018-06-09 – 2018-06-10 (×2): 500 mg via ORAL
  Filled 2018-06-08 (×2): qty 1

## 2018-06-08 MED ORDER — PROPOFOL 500 MG/50ML IV EMUL
INTRAVENOUS | Status: DC | PRN
  Administered 2018-06-08: 60 ug/kg/min via INTRAVENOUS

## 2018-06-08 MED ORDER — KETOROLAC TROMETHAMINE 30 MG/ML IJ SOLN
INTRAMUSCULAR | Status: AC
  Filled 2018-06-08: qty 1

## 2018-06-08 MED ORDER — DIPHENHYDRAMINE HCL 12.5 MG/5ML PO ELIX: 12.5000 mg | ORAL_SOLUTION | ORAL | Status: DC | PRN

## 2018-06-08 MED ORDER — VITAMIN D3 25 MCG (1000 UNIT) PO TABS
1000.0000 [IU] | ORAL_TABLET | Freq: Every day | ORAL | Status: DC
  Administered 2018-06-09 – 2018-06-10 (×2): 1000 [IU] via ORAL
  Filled 2018-06-08 (×4): qty 1

## 2018-06-08 MED ORDER — ONDANSETRON HCL 4 MG PO TABS: 4.0000 mg | ORAL_TABLET | Freq: Four times a day (QID) | ORAL | Status: DC | PRN

## 2018-06-08 MED ORDER — BIOTIN 2.5 MG PO CAPS: 2.5000 mg | ORAL_CAPSULE | Freq: Every day | ORAL | Status: DC

## 2018-06-08 MED ORDER — MEGARED OMEGA-3 KRILL OIL 500 MG PO CAPS: 350.0000 mg | ORAL_CAPSULE | Freq: Every day | ORAL | Status: DC

## 2018-06-08 MED ORDER — DOCUSATE SODIUM 100 MG PO CAPS
100.0000 mg | ORAL_CAPSULE | Freq: Two times a day (BID) | ORAL | Status: DC
  Administered 2018-06-09 – 2018-06-10 (×3): 100 mg via ORAL
  Filled 2018-06-08 (×3): qty 1

## 2018-06-08 MED ORDER — SODIUM CHLORIDE (PF) 0.9 % IJ SOLN
INTRAMUSCULAR | Status: AC
  Filled 2018-06-08: qty 50

## 2018-06-08 MED ORDER — BUPIVACAINE-EPINEPHRINE (PF) 0.5% -1:200000 IJ SOLN
INTRAMUSCULAR | Status: DC | PRN
  Administered 2018-06-08: 30 mL via PERINEURAL

## 2018-06-08 MED ORDER — HYDROCODONE-ACETAMINOPHEN 5-325 MG PO TABS
1.0000 | ORAL_TABLET | ORAL | Status: DC | PRN
  Administered 2018-06-08 – 2018-06-09 (×6): 1 via ORAL
  Administered 2018-06-10 (×3): 2 via ORAL
  Filled 2018-06-08 (×2): qty 2
  Filled 2018-06-08: qty 1
  Filled 2018-06-08: qty 2
  Filled 2018-06-08 (×2): qty 1
  Filled 2018-06-08 (×2): qty 2
  Filled 2018-06-08: qty 1

## 2018-06-08 MED ORDER — FLEET ENEMA 7-19 GM/118ML RE ENEM: 1.0000 | ENEMA | Freq: Once | RECTAL | Status: DC | PRN

## 2018-06-08 MED ORDER — BACITRACIN 50000 UNITS IM SOLR
INTRAMUSCULAR | Status: AC
  Filled 2018-06-08: qty 3

## 2018-06-08 MED ORDER — ACETAMINOPHEN 325 MG PO TABS: 325.0000 mg | ORAL_TABLET | Freq: Four times a day (QID) | ORAL | Status: DC | PRN

## 2018-06-08 MED ORDER — METOCLOPRAMIDE HCL 5 MG/ML IJ SOLN: 5.0000 mg | Freq: Three times a day (TID) | INTRAMUSCULAR | Status: DC | PRN

## 2018-06-08 SURGICAL SUPPLY — 60 items
BANDAGE ELASTIC 6 LF NS (GAUZE/BANDAGES/DRESSINGS) ×3 IMPLANT
BLADE SAW SAG 25X90X1.19 (BLADE) ×3 IMPLANT
BLADE SURG SZ20 CARB STEEL (BLADE) ×3 IMPLANT
CANISTER SUCT 1200ML W/VALVE (MISCELLANEOUS) ×3 IMPLANT
CANISTER SUCT 3000ML PPV (MISCELLANEOUS) ×3 IMPLANT
CEMENT BONE R 1X40 (Cement) ×6 IMPLANT
CEMENT VACUUM MIXING SYSTEM (MISCELLANEOUS) ×3 IMPLANT
CHLORAPREP W/TINT 26ML (MISCELLANEOUS) ×3 IMPLANT
COOLER POLAR GLACIER W/PUMP (MISCELLANEOUS) ×3 IMPLANT
COVER MAYO STAND STRL (DRAPES) ×3 IMPLANT
COVER WAND RF STERILE (DRAPES) IMPLANT
CUFF TOURN 24 STER (MISCELLANEOUS) IMPLANT
CUFF TOURN 30 STER DUAL PORT (MISCELLANEOUS) ×3 IMPLANT
DRAPE IMP U-DRAPE 54X76 (DRAPES) ×3 IMPLANT
DRAPE INCISE IOBAN 66X45 STRL (DRAPES) ×3 IMPLANT
DRAPE SHEET LG 3/4 BI-LAMINATE (DRAPES) ×3 IMPLANT
DRSG OPSITE POSTOP 4X10 (GAUZE/BANDAGES/DRESSINGS) ×3 IMPLANT
DRSG OPSITE POSTOP 4X8 (GAUZE/BANDAGES/DRESSINGS) ×3 IMPLANT
ELECT CAUTERY BLADE 6.4 (BLADE) ×3 IMPLANT
ELECT REM PT RETURN 9FT ADLT (ELECTROSURGICAL) ×3
ELECTRODE REM PT RTRN 9FT ADLT (ELECTROSURGICAL) ×1 IMPLANT
FEMORAL CR LEFT  70MM (Joint) ×2 IMPLANT
FEMORAL CR LEFT 70MM (Joint) ×1 IMPLANT
GLOVE BIO SURGEON STRL SZ7.5 (GLOVE) ×12 IMPLANT
GLOVE BIO SURGEON STRL SZ8 (GLOVE) ×12 IMPLANT
GLOVE BIOGEL PI IND STRL 8 (GLOVE) ×1 IMPLANT
GLOVE BIOGEL PI INDICATOR 8 (GLOVE) ×2
GLOVE INDICATOR 8.0 STRL GRN (GLOVE) ×3 IMPLANT
GOWN STRL REUS W/ TWL LRG LVL3 (GOWN DISPOSABLE) ×1 IMPLANT
GOWN STRL REUS W/ TWL XL LVL3 (GOWN DISPOSABLE) ×1 IMPLANT
GOWN STRL REUS W/TWL LRG LVL3 (GOWN DISPOSABLE) ×2
GOWN STRL REUS W/TWL XL LVL3 (GOWN DISPOSABLE) ×2
HOLDER FOLEY CATH W/STRAP (MISCELLANEOUS) ×3 IMPLANT
HOOD PEEL AWAY FLYTE STAYCOOL (MISCELLANEOUS) ×9 IMPLANT
IMMBOLIZER KNEE 19 BLUE UNIV (SOFTGOODS) ×3 IMPLANT
INSERT TIB BEARING 71 10 (Insert) ×3 IMPLANT
KIT TURNOVER KIT A (KITS) ×3 IMPLANT
NDL SAFETY ECLIPSE 18X1.5 (NEEDLE) ×2 IMPLANT
NEEDLE HYPO 18GX1.5 SHARP (NEEDLE) ×4
NEEDLE SPNL 20GX3.5 QUINCKE YW (NEEDLE) ×3 IMPLANT
NS IRRIG 1000ML POUR BTL (IV SOLUTION) ×3 IMPLANT
PACK TOTAL KNEE (MISCELLANEOUS) ×3 IMPLANT
PAD WRAPON POLAR KNEE (MISCELLANEOUS) ×1 IMPLANT
PATELLA SERIES A (Orthopedic Implant) ×3 IMPLANT
PLATE KNEE TIBIAL 71MM FIXED (Plate) ×3 IMPLANT
PULSAVAC PLUS IRRIG FAN TIP (DISPOSABLE) ×3
SOL .9 NS 3000ML IRR  AL (IV SOLUTION) ×2
SOL .9 NS 3000ML IRR UROMATIC (IV SOLUTION) ×1 IMPLANT
STAPLER SKIN PROX 35W (STAPLE) ×3 IMPLANT
SUCTION FRAZIER HANDLE 10FR (MISCELLANEOUS) ×2
SUCTION TUBE FRAZIER 10FR DISP (MISCELLANEOUS) ×1 IMPLANT
SUT VIC AB 0 CT1 36 (SUTURE) ×9 IMPLANT
SUT VIC AB 2-0 CT1 27 (SUTURE) ×6
SUT VIC AB 2-0 CT1 TAPERPNT 27 (SUTURE) ×3 IMPLANT
SYR 10ML LL (SYRINGE) ×3 IMPLANT
SYR 20CC LL (SYRINGE) ×3 IMPLANT
SYR 30ML LL (SYRINGE) ×9 IMPLANT
TIP FAN IRRIG PULSAVAC PLUS (DISPOSABLE) ×1 IMPLANT
TRAY FOLEY MTR SLVR 16FR STAT (SET/KITS/TRAYS/PACK) ×3 IMPLANT
WRAPON POLAR PAD KNEE (MISCELLANEOUS) ×3

## 2018-06-08 NOTE — Anesthesia Procedure Notes (Signed)
Spinal  Patient location during procedure: OR Start time: 06/08/2018 7:45 AM End time: 06/08/2018 7:55 AM Staffing Anesthesiologist: Gunnar Fusi, MD Resident/CRNA: Eben Burow, CRNA Performed: anesthesiologist  Preanesthetic Checklist Completed: patient identified, site marked, surgical consent, pre-op evaluation, timeout performed, IV checked, risks and benefits discussed and monitors and equipment checked Spinal Block Patient position: sitting Prep: Betadine and site prepped and draped Patient monitoring: heart rate, continuous pulse ox and blood pressure Approach: midline Location: L3-4 Injection technique: single-shot Needle Needle type: Pencan  Needle gauge: 24 G Needle length: 10 cm Assessment Sensory level: T6

## 2018-06-08 NOTE — Evaluation (Signed)
Physical Therapy Evaluation Patient Details Name: Kelsey Weaver MRN: [id number] DOB: 11/02/1945 Today's Date: 06/08/2018   History of Present Illness  Patient is a 73 year old female admitted for L TKA. PMH includes HTN, DM, afib, GERD.  Clinical Impression  Patient pleasant, reports moderate pain, husband present during session. Agreeable to PT. Patient requires min assist with supine>< sit and sit to stand transfers. Ambulated with rw 15 feet, WBAT. Performed supine LE exercises. Patient will benefit from continued PT to address her weakness, decreased activity, decreased ROM, and to improve functional independence for return home at discharge.     Follow Up Recommendations Home health PT    Equipment Recommendations       Recommendations for Other Services       Precautions / Restrictions Precautions Precautions: Fall Restrictions Weight Bearing Restrictions: No Other Position/Activity Restrictions: WBAT      Mobility  Bed Mobility Overal bed mobility: Needs Assistance Bed Mobility: Supine to Sit;Sit to Supine     Supine to sit: Modified independent (Device/Increase time);Min assist Sit to supine: Modified independent (Device/Increase time);Min guard   General bed mobility comments: requires use of bed rails, trapeze to position in bed. Min A with L LE moving off bed.   Transfers Overall transfer level: Needs assistance Equipment used: Rolling walker (2 wheeled) Transfers: Sit to/from Stand Sit to Stand: Min guard            Ambulation/Gait Ambulation/Gait assistance: Modified independent (Device/Increase time);Min guard Gait Distance (Feet): 15 Feet Assistive device: Rolling walker (2 wheeled) Gait Pattern/deviations: Step-to pattern Gait velocity: decreased   General Gait Details: demonstrates good safety awareness, adequate WB on LEft LE  Stairs            Wheelchair Mobility    Modified Rankin (Stroke Patients Only)       Balance  Overall balance assessment: Modified Independent                                           Pertinent Vitals/Pain Pain Assessment: 0-10 Pain Score: 6  Pain Descriptors / Indicators: Aching;Operative site guarding;Constant Pain Intervention(s): Limited activity within patient's tolerance;Monitored during session;Repositioned;RN gave pain meds during session;Ice applied    Home Living Family/patient expects to be discharged to:: Private residence Living Arrangements: Spouse/significant other Available Help at Discharge: Family;Available 24 hours/day   Home Access: Stairs to enter Entrance Stairs-Rails: None Entrance Stairs-Number of Steps: 1 Home Layout: One level Home Equipment: Walker - 2 wheels      Prior Function Level of Independence: Independent               Hand Dominance   Dominant Hand: Right    Extremity/Trunk Assessment   Upper Extremity Assessment Upper Extremity Assessment: Overall WFL for tasks assessed    Lower Extremity Assessment Lower Extremity Assessment: LLE deficits/detail;Generalized weakness LLE Deficits / Details: weakness, decreased ROM  due to recent surgery LLE Sensation: WNL    Cervical / Trunk Assessment Cervical / Trunk Assessment: Normal  Communication   Communication: No difficulties  Cognition Arousal/Alertness: Awake/alert Behavior During Therapy: WFL for tasks assessed/performed Overall Cognitive Status: Within Functional Limits for tasks assessed                                        General  Comments      Exercises Total Joint Exercises Ankle Circles/Pumps: AROM;10 reps;Both Quad Sets: AROM;10 reps;Left Hip ABduction/ADduction: AAROM;10 reps Straight Leg Raises: AAROM;5 reps;Left Goniometric ROM: 5-70   Assessment/Plan    PT Assessment Patient needs continued PT services  PT Problem List Decreased range of motion;Decreased mobility;Decreased activity tolerance;Decreased  strength;Decreased balance;Pain       PT Treatment Interventions DME instruction;Functional mobility training;Balance training;Gait training;Therapeutic exercise;Stair training;Therapeutic activities;Patient/family education;Neuromuscular re-education    PT Goals (Current goals can be found in the Care Plan section)  Acute Rehab PT Goals Patient Stated Goal: to return home PT Goal Formulation: With patient/family Time For Goal Achievement: 06/15/18 Potential to Achieve Goals: Good    Frequency BID   Barriers to discharge        Co-evaluation               AM-PAC PT "6 Clicks" Mobility  Outcome Measure Help needed turning from your back to your side while in a flat bed without using bedrails?: A Lot Help needed moving from lying on your back to sitting on the side of a flat bed without using bedrails?: A Little Help needed moving to and from a bed to a chair (including a wheelchair)?: A Little Help needed standing up from a chair using your arms (e.g., wheelchair or bedside chair)?: A Little Help needed to walk in hospital room?: A Little Help needed climbing 3-5 steps with a railing? : A Little 6 Click Score: 17    End of Session Equipment Utilized During Treatment: Gait belt Activity Tolerance: Patient tolerated treatment well Patient left: in bed;with call bell/phone within reach;with bed alarm set;with family/visitor present Nurse Communication: Mobility status PT Visit Diagnosis: Muscle weakness (generalized) (M62.81);Difficulty in walking, not elsewhere classified (R26.2);Pain Pain - Right/Left: Left Pain - part of body: Knee    Time: 1545-1615 PT Time Calculation (min) (ACUTE ONLY): 30 min   Charges:   PT Evaluation $PT Eval Moderate Complexity: 1 Mod PT Treatments $Gait Training: 8-22 mins        Momoko Slezak, PT, GCS 06/08/18,4:47 PM

## 2018-06-08 NOTE — Op Note (Signed)
06/08/2018  4:24 PM  Patient:   Kelsey Weaver  Pre-Op Diagnosis:   Degenerative joint disease, left knee.  Post-Op Diagnosis:   Same  Procedure:   Left TKA using all-cemented [employer] Vanguard system with a 70 mm PCR femur, a 71 mm tibial tray with a 10 mm anterior stabilized e-poly insert, and a 31 x 8 mm all-poly 3-pegged domed patella.  Surgeon:   Pascal Lux, MD  Assistant:   Cameron Proud, PA-C   Anesthesia:   Spinal  Findings:   As above  Complications:   None  EBL:   5 cc  Fluids:   500 cc crystalloid  UOP:   125 cc  TT:   80 minutes at 300 mmHg  Drains:   None  Closure:   Staples  Implants:   As above  Brief Clinical Note:   The patient is a 73 year old female with a long history of progressively worsening left knee pain. The patient's symptoms have progressed despite medications, activity modification, injections, etc. The patient's history and examination were consistent with advanced degenerative joint disease of the right knee confirmed by plain radiographs. The patient presents at this time for a left total knee arthroplasty.  Procedure:   The patient was brought into the operating room. After adequate spinal anesthesia was obtained, the patient was lain in the supine position. A Foley catheter was placed by the nurse before the right lower extremity was prepped with ChloraPrep solution and draped sterilely. Preoperative antibiotics were administered. After verifying the proper laterality with a surgical timeout, the limb was exsanguinated with an Esmarch and the tourniquet inflated to 300 mmHg. A standard anterior approach to the knee was made through an approximately 7 inch incision. The incision was carried down through the subcutaneous tissues to expose superficial retinaculum. This was split the length of the incision and the medial flap elevated sufficiently to expose the medial retinaculum. The medial retinaculum was incised, leaving a 3-4 mm cuff of tissue  on the patella. This was extended distally along the medial border of the patellar tendon and proximally through the medial third of the quadriceps tendon. A subtotal fat pad excision was performed before the soft tissues were elevated off the anteromedial and anterolateral aspects of the proximal tibia to the level of the collateral ligaments. The anterior portions of the medial and lateral menisci were removed, as was the anterior cruciate ligament. With the knee flexed to 90, the external tibial guide was positioned and the appropriate proximal tibial cut made. This piece was taken to the back table where it was measured and found to be optimally replicated by a 71 mm component.  Attention was directed to the distal femur. The intramedullary canal was accessed through a 3/8" drill hole. The intramedullary guide was inserted and positioned in order to obtain a neutral flexion gap. The intercondylar block was positioned with care taken to avoid notching the anterior cortex of the femur. The appropriate cut was made. Next, the distal cutting block was placed at 6 of valgus alignment. Using the 9 mm slot, the distal cut was made. The distal femur was measured and found to be optimally replicated by the 70 mm component. The 70 mm 4-in-1 cutting block was positioned and first the posterior, then the posterior chamfer, the anterior chamfer, and finally the anterior cuts were made. At this point, the posterior portions medial and lateral menisci were removed. A trial reduction was performed using the appropriate femoral and tibial components with the  10 mm insert. This demonstrated excellent stability to varus and valgus stressing both in flexion and extension while permitting full extension. Patella tracking was assessed and found to be excellent. Therefore, the tibial guide position was marked on the proximal tibia. The patella thickness was measured and found to be 21 mm. Therefore, the appropriate cut was made.  The patellar surface was measured and found to be optimally replicated by the 31 mm component. The three peg holes were drilled in place before the trial button was inserted. Patella tracking was assessed and found to be excellent, passing the "no thumb test". The lug holes were drilled into the distal femur before the trial component was removed, leaving only the tibial tray. The keel was then created using the appropriate tower, reamer, and punch.  The bony surfaces were prepared for cementing by irrigating them thoroughly with bacitracin saline solution via the jet lavage system. A bone plug was fashioned from some of the bone that had been removed previously and used to plug the distal femoral canal. In addition, 20 cc of Exparel diluted out to 60 cc with normal saline and 30 cc of 0.5% Sensorcaine were injected into the postero-medial and postero-lateral aspects of the knee, the medial and lateral gutter regions, and the peri-incisional tissues to help with postoperative analgesia. Meanwhile, the cement was being mixed on the back table. When it was ready, the tibial tray was cemented in first. The excess cement was removed using [employer] fast streamer. Next, the femoral component was impacted into place. Again, the excess cement was removed using [employer] fast streamer. The 10 mm trial insert was positioned and the knee brought into extension while the cement hardened. Finally, the patella was cemented into place and secured using the patellar clamp. Again, the excess cement was removed using [employer] fast streamer. Once the cement had hardened, the knee was placed through a range of motion with the findings as described above. Therefore, the trial insert was removed and, after verifying that no cement had been retained posteriorly, the permanent 10 mm anterior stabilized E-polyethylene insert was positioned and secured using the appropriate key locking mechanism. Again the knee was placed through a range of motion with the  findings as described above.  The wound was copiously irrigated with bacitracin saline solution using the jet lavage system before the quadriceps tendon and retinacular layer were reapproximated using #0 Vicryl interrupted sutures. The superficial retinacular layer also was closed using a running #0 Vicryl suture. A total of 10 cc of transexemic acid (TXA) was injected intra-articularly before the subcutaneous tissues were closed in several layers using 2-0 Vicryl interrupted sutures. The skin was closed using staples. A sterile honeycomb dressing was applied to the skin before the leg was wrapped with an Ace wrap to accommodate the Polar Care device. The patient was then awakened and returned to the recovery room in satisfactory condition after tolerating the procedure well.

## 2018-06-08 NOTE — Anesthesia Post-op Follow-up Note (Signed)
Anesthesia QCDR form completed.        

## 2018-06-08 NOTE — Anesthesia Preprocedure Evaluation (Signed)
Anesthesia Evaluation  Patient identified by MRN, date of birth, ID band Patient awake    Reviewed: Allergy & Precautions, NPO status , Patient's Chart, lab work & pertinent test results  History of Anesthesia Complications Negative for: history of anesthetic complications  Airway Mallampati: II       Dental   Pulmonary neg sleep apnea, neg COPD,           Cardiovascular hypertension, Pt. on medications and Pt. on home beta blockers (-) Past MI and (-) CHF + dysrhythmias Atrial Fibrillation (-) Valvular Problems/Murmurs     Neuro/Psych neg Seizures    GI/Hepatic Neg liver ROS, GERD  Medicated,  Endo/Other  diabetes, Type 2, Oral Hypoglycemic Agents  Renal/GU negative Renal ROS     Musculoskeletal   Abdominal   Peds  Hematology   Anesthesia Other Findings   Reproductive/Obstetrics                             Anesthesia Physical  Anesthesia Plan  ASA: III  Anesthesia Plan: General   Post-op Pain Management: GA combined w/ Regional for post-op pain   Induction:   PONV Risk Score and Plan: 3 and Dexamethasone, Ondansetron and Midazolam  Airway Management Planned: Nasal Cannula  Additional Equipment:   Intra-op Plan:   Post-operative Plan:   Informed Consent: I have reviewed the patients History and Physical, chart, labs and discussed the procedure including the risks, benefits and alternatives for the proposed anesthesia with the patient or authorized representative who has indicated his/her understanding and acceptance.       Plan Discussed with:   Anesthesia Plan Comments:         Anesthesia Quick Evaluation

## 2018-06-08 NOTE — Progress Notes (Signed)
Pt placed on CPM at 70 degrees.

## 2018-06-08 NOTE — NC FL2 (Signed)
[place] LEVEL OF CARE SCREENING TOOL     IDENTIFICATION  Patient Name: Kelsey Weaver Birthdate: July 16, 1945 Sex: female Admission Date (Current Location): 06/08/2018  [place] and [place] Number:  Engineering geologist and Address:  [facility], [address] [place], [place] [address]      Provider Number: [id number]  Attending Physician Name and Address:  Corky Mull, MD  Relative Name and Phone Number:       Current Level of Care: Hospital Recommended Level of Care: [place] Prior Approval Number:    Date Approved/Denied:   PASRR Number: [id number])  Discharge Plan: SNF    Current Diagnoses: Patient Active Problem List   Diagnosis Date Noted  . Status post total knee replacement using cement, 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  . Complex tear of lateral meniscus of left knee as current injury 04/10/2018  . Complex tear of medial meniscus of left knee as current injury 04/10/2018  . Primary osteoarthritis of left knee 04/10/2018  . Strain of left hip 04/10/2018  . 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 Insomnia 12/30/2015  . Type 2 diabetes mellitus ([place]) 12/30/2015  . Hyperlipidemia 12/30/2015  . Extremity edema 12/30/2015  . Atrial fibrillation ([place]) 11/17/2015  . Degenerative arthritis of left shoulder region 10/13/2015    Orientation RESPIRATION BLADDER Height & Weight     Self, Time, Situation, Place  Normal Continent Weight: 194 lb 14.2 oz (88.4 kg) Height:  5' 5.25" (165.7 cm)  BEHAVIORAL SYMPTOMS/MOOD NEUROLOGICAL BOWEL NUTRITION STATUS      Continent Diet(Diet: Heart Healthy/ Carb Modified. )  AMBULATORY STATUS COMMUNICATION OF NEEDS Skin   Extensive Assist Verbally Surgical wounds(Incision: Left Knee. )                       Personal Care Assistance Level of Assistance  Bathing, Feeding, Dressing Bathing Assistance: Limited assistance Feeding assistance:  Independent Dressing Assistance: Limited assistance     Functional Limitations Info  Sight, Hearing, Speech Sight Info: Adequate Hearing Info: Adequate Speech Info: Adequate    SPECIAL CARE FACTORS FREQUENCY  PT (By licensed PT), OT (By licensed OT)     PT Frequency: (5) OT Frequency: (5)            Contractures      Additional Factors Info  Code Status, Allergies Code Status Info: (Full Code. ) Allergies Info: (Ambien Zolpidem Tartrate, Compazine Prochlorperazine Edisylate, Metoprolol, Reglan Metoclopramide)           Current Medications (06/08/2018):  This is the current hospital active medication list Current Facility-Administered Medications  Medication Dose Route Frequency Provider Last Rate Last Dose  . 0.9 %  sodium chloride infusion   Intravenous Continuous Poggi, Marshall Cork, MD 75 mL/hr at 06/08/18 1239    . acetaminophen (TYLENOL) tablet 1,000 mg  1,000 mg Oral Q6H Poggi, Marshall Cork, MD   1,000 mg at 06/08/18 1237  . [START ON 06/09/2018] acetaminophen (TYLENOL) tablet 325-650 mg  325-650 mg Oral Q6H PRN Poggi, Marshall Cork, MD      . atenolol (TENORMIN) tablet 25 mg  25 mg Oral Daily Poggi, Marshall Cork, MD      . bisacodyl (DULCOLAX) suppository 10 mg  10 mg Rectal Daily PRN Poggi, Marshall Cork, MD      . ceFAZolin (ANCEF) IVPB 2g/100 mL premix  2 g Intravenous Q6H Poggi, Marshall Cork, MD 200 mL/hr at 06/08/18 1512 2 g at 06/08/18 1512  .  cholecalciferol (VITAMIN D) tablet 1,000 Units  1,000 Units Oral Daily Poggi, Marshall Cork, MD      . diphenhydrAMINE (BENADRYL) 12.5 MG/5ML elixir 12.5-25 mg  12.5-25 mg Oral Q4H PRN Poggi, Marshall Cork, MD      . docusate sodium (COLACE) capsule 100 mg  100 mg Oral BID Poggi, Marshall Cork, MD      . Derrill Memo ON 06/09/2018] enoxaparin (LOVENOX) injection 40 mg  40 mg Subcutaneous Q24H Poggi, Marshall Cork, MD      . famotidine (PEPCID) tablet 20 mg  20 mg Oral QHS Poggi, Marshall Cork, MD      . flecainide (TAMBOCOR) tablet 50 mg  50 mg Oral BID Poggi, Marshall Cork, MD      .  HYDROcodone-acetaminophen (NORCO/VICODIN) 5-325 MG per tablet 1-2 tablet  1-2 tablet Oral Q4H PRN Poggi, Marshall Cork, MD   1 tablet at 06/08/18 1619  . HYDROmorphone (DILAUDID) injection 0.25-0.5 mg  0.25-0.5 mg Intravenous Q2H PRN Poggi, Marshall Cork, MD      . insulin aspart (novoLOG) injection 0-15 Units  0-15 Units Subcutaneous TID WC Poggi, Marshall Cork, MD   2 Units at 06/08/18 1237  . ketorolac (TORADOL) 15 MG/ML injection 15 mg  15 mg Intravenous Q6H Poggi, Marshall Cork, MD      . magnesium hydroxide (MILK OF MAGNESIA) suspension 30 mL  30 mL Oral Daily PRN Poggi, Marshall Cork, MD      . Derrill Memo ON 06/09/2018] metFORMIN (GLUCOPHAGE-XR) 24 hr tablet 500 mg  500 mg Oral Q breakfast Poggi, Marshall Cork, MD      . metoCLOPramide (REGLAN) tablet 5-10 mg  5-10 mg Oral Q8H PRN Poggi, Marshall Cork, MD       Or  . metoCLOPramide (REGLAN) injection 5-10 mg  5-10 mg Intravenous Q8H PRN Poggi, Marshall Cork, MD      . ondansetron (ZOFRAN) tablet 4 mg  4 mg Oral Q6H PRN Poggi, Marshall Cork, MD       Or  . ondansetron (ZOFRAN) injection 4 mg  4 mg Intravenous Q6H PRN Poggi, Marshall Cork, MD   4 mg at 06/08/18 1316  . pravastatin (PRAVACHOL) tablet 20 mg  20 mg Oral QHS Poggi, Marshall Cork, MD      . sodium phosphate (FLEET) 7-19 GM/118ML enema 1 enema  1 enema Rectal Once PRN Poggi, Marshall Cork, MD      . traMADol Veatrice Bourbon) tablet 50 mg  50 mg Oral Q6H Poggi, Marshall Cork, MD   50 mg at 06/08/18 1241  . traZODone (DESYREL) tablet 100 mg  100 mg Oral QHS Poggi, Marshall Cork, MD      . triamterene-hydrochlorothiazide (MAXZIDE-25) 37.5-25 MG per tablet 0.5 tablet  0.5 tablet Oral Daily PRN Poggi, Marshall Cork, MD      . vitamin B-12 (CYANOCOBALAMIN) tablet 500 mcg  500 mcg Oral Daily Poggi, Marshall Cork, MD         Discharge Medications: Please see discharge summary for a list of discharge medications.  Relevant Imaging Results:  Relevant Lab Results:   Additional Information (SSN: [id number])  Nilam Quakenbush, Veronia Beets, LCSW

## 2018-06-08 NOTE — Transfer of Care (Signed)
Immediate Anesthesia Transfer of Care Note  Patient: Kelsey Weaver  Procedure(s) Performed: TOTAL KNEE ARTHROPLASTY (Left Knee)  Patient Location: PACU  Anesthesia Type:Spinal  Level of Consciousness: awake, alert , oriented and patient cooperative  Airway & Oxygen Therapy: Patient Spontanous Breathing  Post-op Assessment: Report given to RN and Post -op Vital signs reviewed and stable  Post vital signs: Reviewed and stable  Last Vitals:  Vitals Value Taken Time  BP 99/54 06/08/2018 10:05 AM  Temp    Pulse 71 06/08/2018 10:05 AM  Resp 15 06/08/2018 10:06 AM  SpO2 95 % 06/08/2018 10:05 AM  Vitals shown include unvalidated device data.  Last Pain:  Vitals:   06/08/18 0607  TempSrc: Oral  PainSc: 2          Complications: No apparent anesthesia complications

## 2018-06-08 NOTE — H&P (Signed)
Paper H&P to be scanned into permanent record. H&P reviewed and patient re-examined. No changes. 

## 2018-06-09 ENCOUNTER — Encounter: Payer: Self-pay | Admitting: Surgery

## 2018-06-09 LAB — BASIC METABOLIC PANEL
Anion gap: 5 (ref 5–15)
BUN: 18 mg/dL (ref 8–23)
CO2: 28 mmol/L (ref 22–32)
Calcium: 8.6 mg/dL — ABNORMAL LOW (ref 8.9–10.3)
Chloride: 103 mmol/L (ref 98–111)
Creatinine, Ser: 0.76 mg/dL (ref 0.44–1.00)
GFR calc Af Amer: >60 mL/min (ref >60)
GFR calc non Af Amer: >60 mL/min (ref >60)
Glucose, Bld: 115 mg/dL — ABNORMAL HIGH (ref 70–99)
Potassium: 3.9 mmol/L (ref 3.5–5.1)
Sodium: 136 mmol/L (ref 135–145)

## 2018-06-09 LAB — GLUCOSE, CAPILLARY
Glucose-Capillary: 95 mg/dL (ref 70–99)
Glucose-Capillary: 108 mg/dL — ABNORMAL HIGH (ref 70–99)
Glucose-Capillary: 76 mg/dL (ref 70–99)
Glucose-Capillary: 92 mg/dL (ref 70–99)

## 2018-06-09 LAB — CBC WITH DIFFERENTIAL/PLATELET
Abs Immature Granulocytes: 0.06 10*3/uL (ref 0.00–0.07)
Basophils Absolute: 0 10*3/uL (ref 0.0–0.1)
Basophils Relative: 0 %
Eosinophils Absolute: 0 10*3/uL (ref 0.0–0.5)
Eosinophils Relative: 0 %
HCT: 38.4 % (ref 36.0–46.0)
Hemoglobin: 12.8 g/dL (ref 12.0–15.0)
Immature Granulocytes: 1 %
Lymphocytes Relative: 8 %
Lymphs Abs: 1.1 10*3/uL (ref 0.7–4.0)
MCH: 31.1 pg (ref 26.0–34.0)
MCHC: 33.3 g/dL (ref 30.0–36.0)
MCV: 93.4 fL (ref 80.0–100.0)
Monocytes Absolute: 1 10*3/uL (ref 0.1–1.0)
Monocytes Relative: 8 %
Neutro Abs: 11.1 10*3/uL — ABNORMAL HIGH (ref 1.7–7.7)
Neutrophils Relative %: 83 %
Platelets: 297 10*3/uL (ref 150–400)
RBC: 4.11 MIL/uL (ref 3.87–5.11)
RDW: 12.5 % (ref 11.5–15.5)
WBC: 13.3 10*3/uL — ABNORMAL HIGH (ref 4.0–10.5)
nRBC: 0 % (ref 0.0–0.2)

## 2018-06-09 MED ORDER — HYDROCODONE-ACETAMINOPHEN 5-325 MG PO TABS: 1.0000 | ORAL_TABLET | ORAL | 0 refills | Status: DC | PRN

## 2018-06-09 MED ORDER — ENOXAPARIN SODIUM 40 MG/0.4ML ~~LOC~~ SOLN: 40.0000 mg | SUBCUTANEOUS | 0 refills | Status: DC

## 2018-06-09 MED ORDER — TRAMADOL HCL 50 MG PO TABS: 50.0000 mg | ORAL_TABLET | Freq: Four times a day (QID) | ORAL | 0 refills | Status: DC

## 2018-06-09 NOTE — Discharge Instructions (Signed)

## 2018-06-09 NOTE — Progress Notes (Signed)
  Subjective: 1 Day Post-Op Procedure(s) (LRB): TOTAL KNEE ARTHROPLASTY (Left) Patient reports pain as 6 on 0-10 scale.   Patient is well, and has had no acute complaints or problems PT and care management to assist with discharge planning. Negative for chest pain and shortness of breath Fever: no Gastrointestinal:Negative for nausea and vomiting  Objective: Vital signs in last 24 hours: Temp:  [96.8 F (36 C)-97.7 F (36.5 C)] 97.7 F (36.5 C) (01/31 0409) Pulse Rate:  [62-85] 81 (01/31 0409) Resp:  [8-19] 19 (01/31 0409) BP: (97-114)/(39-57) 103/45 (01/31 0409) SpO2:  [90 %-97 %] 97 % (01/31 0409)  Intake/Output from previous day:  Intake/Output Summary (Last 24 hours) at 06/09/2018 0741 Last data filed at 06/09/2018 0538 Gross per 24 hour  Intake 750 ml  Output 730 ml  Net 20 ml    Intake/Output this shift: No intake/output data recorded.  Labs: Recent Labs    06/09/18 0612  HGB 12.8   Recent Labs    06/09/18 0612  WBC 13.3*  RBC 4.11  HCT 38.4  PLT 297   Recent Labs    06/09/18 0612  NA 136  K 3.9  CL 103  CO2 28  BUN 18  CREATININE 0.76  GLUCOSE 115*  CALCIUM 8.6*   No results for input(s): LABPT, INR in the last 72 hours.   EXAM General - Patient is Alert, Appropriate and Oriented Extremity - ABD soft Neurovascular intact Sensation intact distally Intact pulses distally Dorsiflexion/Plantar flexion intact Incision: dressing C/D/I No cellulitis present Dressing/Incision - clean, dry, no drainage Motor Function - intact, moving foot and toes well on exam. Negative Homans to bilateral lower extremities.  Past Medical History:  Diagnosis Date  . Cardiac arrhythmia due to congenital heart disease   . Diabetes mellitus without complication ([place])   . Diverticulosis   . DJD (degenerative joint disease)    frozen shoulder  . 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 GERD (gastroesophageal reflux disease)   . History of colonic  polyps   . Hyperlipemia   . Hypertension   . Overweight   . Paroxysmal atrial fibrillation ([place]) 09/2000    Assessment/Plan: 1 Day Post-Op Procedure(s) (LRB): TOTAL KNEE ARTHROPLASTY (Left) Active Problems:   Status post total knee replacement using cement, left  Estimated body mass index is 32.18 kg/m as calculated from the following:   Height as of this encounter: 5' 5.25" (1.657 m).   Weight as of this encounter: 88.4 kg. Advance diet Up with therapy D/C IV fluids when tolerating po intake.  Labs reviewed this AM, WBC 13.3, Hg 12.8.  CBC and BMP ordered for tomorrow morning. Up with therapy today. Patient is passing gas without pain this AM.  Begin working on BM. Plan for possible d/c home later this evening or tomorrow.  DVT Prophylaxis - Lovenox, Foot Pumps and TED hose Weight-Bearing as tolerated to left leg  J. Cameron Proud, PA-C [facility] Orthopaedic Surgery 06/09/2018, 7:41 AM

## 2018-06-09 NOTE — Discharge Summary (Addendum)
Physician Discharge Summary  Patient ID: Kelsey Weaver MRN: [id number] DOB/AGE: [birth year] 73 y.o.  Admit date: 06/08/2018 Discharge date: 06/10/18  Admission Diagnoses:  PRIMARY OSTEOARHTRITIS OF LEFT KNEE  Discharge Diagnoses: Patient Active Problem List   Diagnosis Date Noted  . Status post total knee replacement using cement, 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  . Complex tear of lateral meniscus of left knee as current injury 04/10/2018  . Complex tear of medial meniscus of left knee as current injury 04/10/2018  . Primary osteoarthritis of left knee 04/10/2018  . Strain of left hip 04/10/2018  . 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 Insomnia 12/30/2015  . Type 2 diabetes mellitus ([place]) 12/30/2015  . Hyperlipidemia 12/30/2015  . Extremity edema 12/30/2015  . Atrial fibrillation ([place]) 11/17/2015  . Degenerative arthritis of left shoulder region 10/13/2015    Past Medical History:  Diagnosis Date  . Cardiac arrhythmia due to congenital heart disease   . Diabetes mellitus without complication ([place])   . Diverticulosis   . DJD (degenerative joint disease)    frozen shoulder  . 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 GERD (gastroesophageal reflux disease)   . History of colonic polyps   . Hyperlipemia   . Hypertension   . Overweight   . Paroxysmal atrial fibrillation ([place]) 09/2000     Transfusion: None.   Consultants (if any):   Discharged Condition: Improved  Hospital Course: Kelsey Weaver is an 73 y.o. female who was admitted 06/08/2018 with a diagnosis of primary osteoarthritis of the left knee and went to the operating room on 06/08/2018 and underwent the above named procedures.    Surgeries: Procedure(s): TOTAL KNEE ARTHROPLASTY on 06/08/2018 Patient tolerated the surgery well. Taken to PACU where she was stabilized and then transferred to the orthopedic floor.  Started on Lovenox 40mg  q 24 hrs. Foot pumps applied bilaterally at 80 mm.  Heels elevated on bed with rolled towels. No evidence of DVT. Negative [place]. Physical therapy started on day #1 for gait training and transfer. OT started day #1 for ADL and assisted devices.  Patient's IV was removed on POD2, Foley removed shortly after surgery.  Implants: Left TKA using all-cemented [employer] Vanguard system with a 70 mm PCR femur, a 71 mm tibial tray with a 10 mm anterior stabilized e-poly insert, and a 31 x 8 mm all-poly 3-pegged domed patella.  She was given perioperative antibiotics:  Anti-infectives (From admission, onward)   Start     Dose/Rate Route Frequency Ordered Stop   06/08/18 1400  ceFAZolin (ANCEF) IVPB 2g/100 mL premix     2 g 200 mL/hr over 30 Minutes Intravenous Every 6 hours 06/08/18 1148 06/09/18 0133   06/08/18 0931  bacitracin 50,000 Units in sodium chloride 0.9 % 500 mL irrigation  Status:  Discontinued       As needed 06/08/18 0931 06/08/18 1000   06/08/18 0549  ceFAZolin (ANCEF) 2-4 GM/100ML-% IVPB    Note to Pharmacy:  Milinda Cave   : cabinet override      06/08/18 0549 06/08/18 0805   06/07/18 2330  ceFAZolin (ANCEF) IVPB 2g/100 mL premix     2 g 200 mL/hr over 30 Minutes Intravenous  Once 06/07/18 2324 06/08/18 0810    .  She was given sequential compression devices, early ambulation, and Lovenox for DVT prophylaxis.  She benefited maximally from the hospital stay and there were no complications.    Recent vital signs:  Vitals:   06/09/18  1654 06/09/18 2316  BP: (!) 111/47 (!) 129/58  Pulse: 75 79  Resp:  19  Temp:  98.2 F (36.8 C)  SpO2: 95% 95%    Recent laboratory studies:  Lab Results  Component Value Date   HGB 11.2 (L) 06/10/2018   HGB 12.8 06/09/2018   HGB 14.8 05/24/2018   Lab Results  Component Value Date   WBC 9.3 06/10/2018   PLT 243 06/10/2018   Lab Results  Component Value Date   INR 0.95 07/19/2017   Lab Results  Component Value Date   NA 137 06/10/2018   K 3.8 06/10/2018   CL 105 06/10/2018    CO2 29 06/10/2018   BUN 22 06/10/2018   CREATININE 0.60 06/10/2018   GLUCOSE 89 06/10/2018    Discharge Medications:   Allergies as of 06/10/2018      Reactions   Ambien [zolpidem Tartrate] Other (See Comments)   Flu like symptoms    Compazine [prochlorperazine Edisylate] Other (See Comments)   Aches and pains, generalized   Metoprolol Other (See Comments)   Depression    Reglan [metoclopramide] Anxiety      Medication List    TAKE these medications   atenolol 25 MG tablet Commonly known as:  TENORMIN TAKE 1 TABLET (25 MG TOTAL) BY MOUTH AS NEEDED. What changed:  when to take this   Biotin 2.5 MG Caps Take 2.5 mg by mouth daily.   cholecalciferol 1000 units tablet Commonly known as:  VITAMIN D Take 1,000 Units by mouth daily.   diclofenac sodium 1 % Gel Commonly known as:  VOLTAREN Apply 2 g topically 3 (three) times daily as needed (for knee pain).   enoxaparin 40 MG/0.4ML injection Commonly known as:  LOVENOX Inject 0.4 mLs (40 mg total) into the skin daily.   flecainide 50 MG tablet Commonly known as:  TAMBOCOR TAKE 1 TABLET BY MOUTH TWICE A DAY AS NEEDED FOR AFIB What changed:  See the new instructions.   HYDROcodone-acetaminophen 5-325 MG tablet Commonly known as:  NORCO/VICODIN Take 1-2 tablets by mouth every 4 (four) hours as needed for moderate pain.   ibuprofen 800 MG tablet Commonly known as:  ADVIL,MOTRIN Take 800 mg by mouth every 8 (eight) hours as needed for headache or moderate pain.   MEGARED OMEGA-3 KRILL OIL PO Take 350 mg by mouth at bedtime.   metFORMIN 500 MG 24 hr tablet Commonly known as:  GLUCOPHAGE-XR TAKE 1 TABLET BY MOUTH EVERY DAY WITH BREAKFAST What changed:  See the new instructions.   PEPCID AC MAXIMUM STRENGTH 20 MG tablet Generic drug:  famotidine Take 20 mg by mouth at bedtime.   pravastatin 20 MG tablet Commonly known as:  PRAVACHOL TAKE ONE TABLET BY MOUTH ONCE DAILY What changed:  when to take this   traMADol 50  MG tablet Commonly known as:  ULTRAM Take 1-2 tablets (50-100 mg total) by mouth every 6 (six) hours. What changed:    when to take this  reasons to take this   traZODone 50 MG tablet Commonly known as:  DESYREL TAKE 1-2 TABLETS (50-100 MG TOTAL) BY MOUTH AT BEDTIME AS NEEDED FOR SLEEP. What changed:  See the new instructions.   triamterene-hydrochlorothiazide 37.5-25 MG tablet Commonly known as:  MAXZIDE-25 Take 0.5 tablets by mouth daily as needed ([place]).   Turmeric 500 MG Tabs Take 500 mg by mouth daily.   VITAMIN B-12 PO Take 1 tablet by mouth daily.  Durable Medical Equipment  (From admission, onward)         Start     Ordered   06/08/18 1149  DME Bedside commode  Once    Question:  Patient needs a bedside commode to treat with the following condition  Answer:  Status post total knee replacement using cement, left   06/08/18 1148   06/08/18 1149  DME 3 n 1  Once     06/08/18 1148   06/08/18 1149  DME Walker rolling  Once    Question:  Patient needs a walker to treat with the following condition  Answer:  Status post total knee replacement using cement, left   06/08/18 1148          Diagnostic Studies: Dg Chest 2 View  Result Date: 05/24/2018 CLINICAL DATA:  Preop for right knee replacement. EXAM: CHEST - 2 VIEW COMPARISON:  None. FINDINGS: The heart size and mediastinal contours are within normal limits. Both lungs are clear. The visualized skeletal structures are unremarkable. IMPRESSION: No active cardiopulmonary disease. Electronically Signed   By: Marijo Conception, M.D.   On: 05/24/2018 19:16   Dg Knee Left Port  Result Date: 06/08/2018 CLINICAL DATA:  Status post left knee replacement EXAM: PORTABLE LEFT KNEE - 1-2 VIEW COMPARISON:  None. FINDINGS: Left knee prosthesis is noted in satisfactory position. No acute bony or soft tissue abnormality is noted. IMPRESSION: Status post left knee replacement. Electronically Signed   By: Inez Catalina  M.D.   On: 06/08/2018 12:07   Dg Foot Complete Left  Result Date: 05/15/2018 Please see detailed radiograph report in office note.  Disposition: Discharge disposition: 01-Home or Self Care     Plan for discharge home on 06/10/18.  Follow-up Information    Lattie Corns, PA-C Follow up in 14 day(s).   Specialty:  Physician Assistant Why:  Electa Sniff information: [place] [phone]          Signed: Prescott Parma, Elianne Gubser PA-C 06/10/2018, 6:39 AM

## 2018-06-09 NOTE — Progress Notes (Signed)
Physical Therapy Treatment Patient Details Name: Kelsey Weaver MRN: [id number] DOB: 05-29-1945 Today's Date: 06/09/2018    History of Present Illness Patient is a 73 year old female admitted for L TKA. PMH includes HTN, DM, afib, GERD.    PT Comments    Pt progressing well overall. Received  Supine in CPM husband at bedside. Pt performs all mobility at supervision level of better. Extensive education and cues for gait quality and RW use, pt able to perform all as directed. Pt again progressing AMB to >28ft this session. Also performs stairs with husband in attendance. Education given on operation for CPM set use and adjustment. Pt progressing well toward all goals, encouraged to try out HEP with husband this evening.     Follow Up Recommendations  Home health PT     Equipment Recommendations  None recommended by PT    Recommendations for Other Services       Precautions / Restrictions Precautions Precautions: Fall;Knee Restrictions LLE Weight Bearing: Weight bearing as tolerated    Mobility  Bed Mobility Overal bed mobility: Modified Independent Bed Mobility: Supine to Sit;Sit to Supine           General bed mobility comments: reviewed foot hook for back into bed, no physical asssit needed.   Transfers Overall transfer level: Needs assistance Equipment used: Rolling walker (2 wheeled) Transfers: Sit to/from Stand Sit to Stand: Modified independent (Device/Increase time)         General transfer comment: No verbal cues needed for safe, confident RW use.   Ambulation/Gait Ambulation/Gait assistance: Supervision Gait Distance (Feet): 260 Feet Assistive device: Rolling walker (2 wheeled) Gait Pattern/deviations: Step-through pattern Gait velocity: 0.46m/s    General Gait Details: VC for step-through gait, tall posture, 2-point versus 3-point gait, all achieved by end of AMB    Stairs Stairs: Yes Stairs assistance: Supervision Stair Management:  Backwards;With walker       Wheelchair Mobility    Modified Rankin (Stroke Patients Only)       Balance Overall balance assessment: Modified Independent                                          Cognition Arousal/Alertness: Awake/alert Behavior During Therapy: WFL for tasks assessed/performed Overall Cognitive Status: Within Functional Limits for tasks assessed                                        Exercises      General Comments        Pertinent Vitals/Pain Pain Assessment: Faces Faces Pain Scale: Hurts little more Pain Location: LT knee Pain Descriptors / Indicators: Aching;Operative site guarding;Constant;Grimacing Pain Intervention(s): Limited activity within patient's tolerance    Home Living                      Prior Function            PT Goals (current goals can now be found in the care plan section) Acute Rehab PT Goals Patient Stated Goal: to return home PT Goal Formulation: With patient/family Time For Goal Achievement: 06/15/18 Potential to Achieve Goals: Good Progress towards PT goals: Progressing toward goals    Frequency    BID      PT Plan Current plan remains appropriate  Co-evaluation              AM-PAC PT "6 Clicks" Mobility   Outcome Measure  Help needed turning from your back to your side while in a flat bed without using bedrails?: None Help needed moving from lying on your back to sitting on the side of a flat bed without using bedrails?: None Help needed moving to and from a bed to a chair (including a wheelchair)?: A Little Help needed standing up from a chair using your arms (e.g., wheelchair or bedside chair)?: A Little Help needed to walk in hospital room?: A Little Help needed climbing 3-5 steps with a railing? : A Little 6 Click Score: 20    End of Session   Activity Tolerance: Patient tolerated treatment well;No increased pain Patient left: with chair alarm  set;with SCD's reapplied;with nursing/sitter in room;with call bell/phone within reach;with family/visitor present;in bed;in CPM   PT Visit Diagnosis: Muscle weakness (generalized) (M62.81);Difficulty in walking, not elsewhere classified (R26.2);Pain Pain - Right/Left: Left Pain - part of body: Knee     Time: 3748-2707 PT Time Calculation (min) (ACUTE ONLY): 42 min  Charges:  $Gait Training: 23-37 mins $Self Care/Home Management: 8-22                     3:41 PM, 06/09/18 Etta Grandchild, PT, DPT Physical Therapist - [facility]  [phone] ([place])    Toluca C 06/09/2018, 3:35 PM

## 2018-06-09 NOTE — Anesthesia Postprocedure Evaluation (Signed)
Anesthesia Post Note  Patient: Radio broadcast assistant  Procedure(s) Performed: TOTAL KNEE ARTHROPLASTY (Left Knee)  Patient location during evaluation: Nursing Unit Anesthesia Type: Spinal Level of consciousness: oriented and awake and alert Pain management: pain level controlled Vital Signs Assessment: post-procedure vital signs reviewed and stable Respiratory status: spontaneous breathing, respiratory function stable and patient connected to nasal cannula oxygen Cardiovascular status: blood pressure returned to baseline and stable Postop Assessment: no headache, no backache, no apparent nausea or vomiting and able to ambulate Anesthetic complications: no     Last Vitals:  Vitals:   06/09/18 0409 06/09/18 0801  BP: (!) 103/45 (!) 109/50  Pulse: 81 73  Resp: 19 18  Temp: 36.5 C 36.5 C  SpO2: 97% 96%    Last Pain:  Vitals:   06/09/18 0801  TempSrc: Oral  PainSc:                  Estill Batten

## 2018-06-09 NOTE — Progress Notes (Signed)
Physical Therapy Treatment Patient Details Name: Kelsey Weaver MRN: [id number] DOB: August 02, 1945 Today's Date: 06/09/2018    History of Present Illness Patient is a 73 year old female admitted for L TKA. PMH includes HTN, DM, afib, GERD.    PT Comments    Patient agreeable to PT, pain in L knee at rest 2/10. Pt demonstrated good knowledge of therapeutic exercises, occasional verbal cues for exercise form. Performed bed mobility mod I, sit<>stand with RW supervision/CGA. Patient was able to significantly increase ambulation distance this session, ~150ft with RW and supervision/CGA. Pt naturally progressed from step to gait pattern with verbal cues, to step through pattern. Intermittent verbal cues for upright posture, maintaining RW inside base of support, and L knee flexion during swing phase. RN in room to administer pain medication at end of session. The patient would benefit from further skilled PT to continue to progress towards goals. Next session pt should perform stair navigation in prep for discharge.     Follow Up Recommendations  Home health PT     Equipment Recommendations  None recommended by PT    Recommendations for Other Services       Precautions / Restrictions Precautions Precautions: Fall;Knee Precaution Booklet Issued: Yes (comment) Restrictions Weight Bearing Restrictions: Yes LLE Weight Bearing: Weight bearing as tolerated    Mobility  Bed Mobility Overal bed mobility: Needs Assistance Bed Mobility: Supine to Sit     Supine to sit: Modified independent (Device/Increase time)        Transfers Overall transfer level: Needs assistance Equipment used: Rolling walker (2 wheeled) Transfers: Sit to/from Stand Sit to Stand: Min guard;Supervision         General transfer comment: Pt needed verbal cues for hand placement   Ambulation/Gait Ambulation/Gait assistance: Modified independent (Device/Increase time);Supervision Gait Distance (Feet): 180  Feet Assistive device: Rolling walker (2 wheeled)   Gait velocity: decreased   General Gait Details: Pt naturally progressed from step to gait pattern with verbal cues, to step through pattern. Intermittent verbal cues for upright posture, maintaining RW inside base of support, and L knee flexion during swing phase.   Stairs             Wheelchair Mobility    Modified Rankin (Stroke Patients Only)       Balance Overall balance assessment: Modified Independent                                          Cognition Arousal/Alertness: Awake/alert Behavior During Therapy: WFL for tasks assessed/performed Overall Cognitive Status: Within Functional Limits for tasks assessed                                        Exercises Total Joint Exercises Ankle Circles/Pumps: AROM;Both;20 reps Quad Sets: AROM;Left;20 reps Hip ABduction/ADduction: Strengthening;AROM;Left;20 reps Straight Leg Raises: AROM;Strengthening;Left;10 reps Knee Flexion: AROM;Left;5 reps Goniometric ROM: 5-65    General Comments        Pertinent Vitals/Pain Pain Assessment: 0-10 Pain Score: 4  Pain Location: L knee Pain Descriptors / Indicators: Aching;Operative site guarding;Constant;Grimacing Pain Intervention(s): Limited activity within patient's tolerance;Monitored during session;Repositioned;Patient requesting pain meds-RN notified;Ice applied    Home Living                      Prior Function  PT Goals (current goals can now be found in the care plan section) Progress towards PT goals: Progressing toward goals    Frequency    BID      PT Plan Current plan remains appropriate    Co-evaluation              AM-PAC PT "6 Clicks" Mobility   Outcome Measure  Help needed turning from your back to your side while in a flat bed without using bedrails?: A Little Help needed moving from lying on your back to sitting on the side of a  flat bed without using bedrails?: A Little Help needed moving to and from a bed to a chair (including a wheelchair)?: A Little Help needed standing up from a chair using your arms (e.g., wheelchair or bedside chair)?: A Little Help needed to walk in hospital room?: A Little Help needed climbing 3-5 steps with a railing? : A Little 6 Click Score: 18    End of Session Equipment Utilized During Treatment: Gait belt Activity Tolerance: Patient tolerated treatment well Patient left: in chair;with chair alarm set;with SCD's reapplied;with nursing/sitter in room;with call bell/phone within reach;with family/visitor present Nurse Communication: Mobility status PT Visit Diagnosis: Muscle weakness (generalized) (M62.81);Difficulty in walking, not elsewhere classified (R26.2);Pain Pain - Right/Left: Left Pain - part of body: Knee     Time: 4098-1191 PT Time Calculation (min) (ACUTE ONLY): 31 min  Charges:  $Gait Training: 8-22 mins $Therapeutic Exercise: 8-22 mins                     Lieutenant Diego PT, DPT 10:03 AM,06/09/18 [phone]

## 2018-06-09 NOTE — Care Management (Signed)
Attempted to reach the Physical therapist Beverlee Nims, to obtain the settings for the CPM, Unable to reach, left a secure IM requesting information

## 2018-06-09 NOTE — Care Management Note (Signed)
Case Management Note  Patient Details  Name: Kelsey Weaver MRN: [id number] Date of Birth: 1945/12/05  Subjective/Objective:                   Met with the patient and her husband to discuss DC plan and needs Patient has a RW and 3 in 1 at home Patient provided with the [facility] list per CMS.gov Patient would like ot use [facility], Notified teresa with [facility] Patient uses Dr. Carlis Abbott as a PCP Patient uses [facility] [address] for pharmacy and can afford medications Patient drives and has husband for transportation  Action/Plan: [facility] list provided per CMS.gov  Expected Discharge Date:                  Expected Discharge Plan:     In-House Referral:     Discharge planning Services  CM Consult  Post Acute Care Choice:    Choice offered to:     DME Arranged:    DME Agency:     HH Arranged:  PT HH Agency:  [facility] (formerly [facility])  Status of Service:  Completed, signed off  If discussed at [employer], dates discussed:    Additional Comments:  Kelsey Hilt, RN 06/09/2018, 11:21 AM

## 2018-06-09 NOTE — Progress Notes (Signed)
Clinical Social Worker (CSW) received SNF consult. PT is recommending home health. RN case manager aware of above. Please reconsult if future social work needs arise. CSW signing off.   Derisha Funderburke, LCSW [phone] 

## 2018-06-09 NOTE — Care Management (Signed)
Spoke with Ovid Curd at [facility] at [phone], verified that the CPM is set up and that he has spoken to the patient.  He has the order in place and anticipate that the patient will DC home on 2/1.  No further needs

## 2018-06-10 LAB — GLUCOSE, CAPILLARY
Glucose-Capillary: 88 mg/dL (ref 70–99)
Glucose-Capillary: 81 mg/dL (ref 70–99)

## 2018-06-10 LAB — CBC
HCT: 34.4 % — ABNORMAL LOW (ref 36.0–46.0)
Hemoglobin: 11.2 g/dL — ABNORMAL LOW (ref 12.0–15.0)
MCH: 30.9 pg (ref 26.0–34.0)
MCHC: 32.6 g/dL (ref 30.0–36.0)
MCV: 94.8 fL (ref 80.0–100.0)
Platelets: 243 10*3/uL (ref 150–400)
RBC: 3.63 MIL/uL — ABNORMAL LOW (ref 3.87–5.11)
RDW: 12.9 % (ref 11.5–15.5)
WBC: 9.3 10*3/uL (ref 4.0–10.5)
nRBC: 0 % (ref 0.0–0.2)

## 2018-06-10 LAB — BASIC METABOLIC PANEL
Anion gap: 3 — ABNORMAL LOW (ref 5–15)
BUN: 22 mg/dL (ref 8–23)
CO2: 29 mmol/L (ref 22–32)
Calcium: 7.9 mg/dL — ABNORMAL LOW (ref 8.9–10.3)
Chloride: 105 mmol/L (ref 98–111)
Creatinine, Ser: 0.6 mg/dL (ref 0.44–1.00)
GFR calc Af Amer: >60 mL/min (ref >60)
GFR calc non Af Amer: >60 mL/min (ref >60)
Glucose, Bld: 89 mg/dL (ref 70–99)
Potassium: 3.8 mmol/L (ref 3.5–5.1)
Sodium: 137 mmol/L (ref 135–145)

## 2018-06-10 MED ORDER — CYCLOBENZAPRINE HCL 10 MG PO TABS
10.0000 mg | ORAL_TABLET | Freq: Once | ORAL | Status: AC | PRN
  Administered 2018-06-10: 10 mg via ORAL
  Filled 2018-06-10: qty 1

## 2018-06-10 NOTE — Progress Notes (Addendum)
  Subjective: 2 Days Post-Op Procedure(s) (LRB): TOTAL KNEE ARTHROPLASTY (Left) Patient reports pain as moderate.  Her pain has increased this morning.  Flexeril added for spasms.  Oral pain medicine..   Patient is well, and has had no acute complaints or problems PT and care management to assist with discharge planning. Negative for chest pain and shortness of breath Fever: no Gastrointestinal:Negative for nausea and vomiting  Objective: Vital signs in last 24 hours: Temp:  [97.7 F (36.5 C)-98.2 F (36.8 C)] 98.2 F (36.8 C) (01/31 2316) Pulse Rate:  [73-81] 79 (01/31 2316) Resp:  [18-19] 19 (01/31 2316) BP: (109-129)/(47-65) 129/58 (01/31 2316) SpO2:  [95 %-96 %] 95 % (01/31 2316)  Intake/Output from previous day:  Intake/Output Summary (Last 24 hours) at 06/10/2018 0755 Last data filed at 06/10/2018 0400 Gross per 24 hour  Intake 1860 ml  Output -  Net 1860 ml    Intake/Output this shift: No intake/output data recorded.  Labs: Recent Labs    06/09/18 0612 06/10/18 0408  HGB 12.8 11.2*   Recent Labs    06/09/18 0612 06/10/18 0408  WBC 13.3* 9.3  RBC 4.11 3.63*  HCT 38.4 34.4*  PLT 297 243   Recent Labs    06/09/18 0612 06/10/18 0408  NA 136 137  K 3.9 3.8  CL 103 105  CO2 28 29  BUN 18 22  CREATININE 0.76 0.60  GLUCOSE 115* 89  CALCIUM 8.6* 7.9*   No results for input(s): LABPT, INR in the last 72 hours.   EXAM General - Patient is Alert, Appropriate and Oriented Extremity - ABD soft Neurovascular intact Sensation intact distally Intact pulses distally Dorsiflexion/Plantar flexion intact Incision: dressing C/D/I No cellulitis present Dressing/Incision - clean, dry, no drainage Motor Function - intact, moving foot and toes well on exam. Negative Homans to bilateral lower extremities.  Past Medical History:  Diagnosis Date  . Cardiac arrhythmia due to congenital heart disease   . Diabetes mellitus without complication ([place])   .  Diverticulosis   . DJD (degenerative joint disease)    frozen shoulder  . 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 GERD (gastroesophageal reflux disease)   . History of colonic polyps   . Hyperlipemia   . Hypertension   . Overweight   . Paroxysmal atrial fibrillation ([place]) 09/2000    Assessment/Plan: 2 Days Post-Op Procedure(s) (LRB): TOTAL KNEE ARTHROPLASTY (Left) Active Problems:   Status post total knee replacement using cement, left  Estimated body mass index is 32.18 kg/m as calculated from the following:   Height as of this encounter: 5' 5.25" (1.657 m).   Weight as of this encounter: 88.4 kg. Advance diet Up with therapy D/C IV fluids when tolerating po intake.  Labs reviewed this AM, WBC 13.3, Hg 11.2.   Continue therapy. Continue working on BM. Plan for possible d/c home today.  DVT Prophylaxis - Lovenox, Foot Pumps and TED hose Weight-Bearing as tolerated to left leg  Reche Dixon, PA-C [facility] Orthopaedic Surgery 06/10/2018, 7:55 AM

## 2018-06-10 NOTE — Care Management Note (Signed)
Case Management Note  Patient Details  Name: Kelsey Weaver MRN: [id number] Date of Birth: [birth year]  Subjective/Objective:    Previous RNCM had began discharge workup for home health services with [facility] per patient preference. Helene Kelp from [facility] aware of referral and pending discharge. No DME needs, family to transport. Merrily Pew Jazell Rosenau RN BSN RNCM [phone]                 Action/Plan:   Expected Discharge Date:  06/10/18               Expected Discharge Plan:     In-House Referral:     Discharge planning Services  CM Consult  Post Acute Care Choice:    Choice offered to:     DME Arranged:    DME Agency:     HH Arranged:  PT HH Agency:  [facility] ([facility])  Status of Service:  Completed, signed off  If discussed at [employer], dates discussed:    Additional Comments:  Kathyrn Drown Devita Nies, RN 06/10/2018, 9:15 AM

## 2018-06-10 NOTE — Progress Notes (Signed)
Physical Therapy Treatment Patient Details Name: Kelsey Weaver MRN: [id number] DOB: 13-Feb-1946 Today's Date: 06/10/2018    History of Present Illness Patient is a 73 year old female admitted for L TKA. PMH includes HTN, DM, afib, GERD.    PT Comments    Participated in exercises as described below.  Ambulated around unit with slow but steady gait.  Requested to use commode upon return to room.  On commode and instructed to call nursing staff when finished.  Voiced understanding.  PT goals met and no further questions or concerns voiced.   Follow Up Recommendations  Home health PT     Equipment Recommendations  None recommended by PT    Recommendations for Other Services       Precautions / Restrictions Precautions Precautions: Fall;Knee Restrictions Weight Bearing Restrictions: Yes LLE Weight Bearing: Weight bearing as tolerated Other Position/Activity Restrictions: WBAT    Mobility  Bed Mobility Overal bed mobility: Modified Independent Bed Mobility: Supine to Sit;Sit to Supine     Supine to sit: Modified independent (Device/Increase time)     General bed mobility comments: reviewed foot hook for back into bed, no physical asssit needed. increased time.  Transfers Overall transfer level: Needs assistance Equipment used: Rolling walker (2 wheeled) Transfers: Sit to/from Stand Sit to Stand: Supervision            Ambulation/Gait Ambulation/Gait assistance: Supervision Gait Distance (Feet): 200 Feet Assistive device: Rolling walker (2 wheeled) Gait Pattern/deviations: Step-through pattern Gait velocity: decreaseed       Stairs         General stair comments: reported feeling confident from training yesterday - declined review   Wheelchair Mobility    Modified Rankin (Stroke Patients Only)       Balance Overall balance assessment: Needs assistance Sitting-balance support: Feet supported Sitting balance-Leahy Scale: Good     Standing  balance support: Bilateral upper extremity supported Standing balance-Leahy Scale: Good                              Cognition Arousal/Alertness: Awake/alert Behavior During Therapy: WFL for tasks assessed/performed Overall Cognitive Status: Within Functional Limits for tasks assessed                                        Exercises Total Joint Exercises Ankle Circles/Pumps: AROM;Both;20 reps Quad Sets: AROM;Left;20 reps Heel Slides: AAROM;10 reps;Left;Supine Hip ABduction/ADduction: AAROM;10 reps;Left;Supine Straight Leg Raises: AAROM;10 reps;Left;Supine Long Arc Quad: AAROM;10 reps;Left;Seated Knee Flexion: AAROM;5 reps;Seated Goniometric ROM: 5-90 Other Exercises Other Exercises: reviewed general safety, self stretching techniques and proper positioning of LLE in supine.    General Comments        Pertinent Vitals/Pain Pain Assessment: 0-10 Pain Score: 4  Pain Location: LT knee Pain Descriptors / Indicators: Aching;Operative site guarding;Constant;Grimacing Pain Intervention(s): Limited activity within patient's tolerance;Monitored during session;Premedicated before session    Home Living                      Prior Function            PT Goals (current goals can now be found in the care plan section) Progress towards PT goals: Progressing toward goals    Frequency    BID      PT Plan      Co-evaluation  AM-PAC PT "6 Clicks" Mobility   Outcome Measure  Help needed turning from your back to your side while in a flat bed without using bedrails?: None Help needed moving from lying on your back to sitting on the side of a flat bed without using bedrails?: None Help needed moving to and from a bed to a chair (including a wheelchair)?: A Little Help needed standing up from a chair using your arms (e.g., wheelchair or bedside chair)?: A Little Help needed to walk in hospital room?: A Little Help needed  climbing 3-5 steps with a railing? : A Little 6 Click Score: 20    End of Session Equipment Utilized During Treatment: Gait belt Activity Tolerance: Patient tolerated treatment well;No increased pain Patient left: Other (comment)   Pain - Right/Left: Left Pain - part of body: Knee     Time: 1102-1117 PT Time Calculation (min) (ACUTE ONLY): 23 min  Charges:  $Gait Training: 8-22 mins $Therapeutic Exercise: 8-22 mins                     Chesley Noon, PTA 06/10/18, 9:39 AM

## 2018-06-11 DIAGNOSIS — Z86010 Personal history of colonic polyps: Secondary | ICD-10-CM | POA: Diagnosis not present

## 2018-06-11 DIAGNOSIS — Z7984 Long term (current) use of oral hypoglycemic drugs: Secondary | ICD-10-CM | POA: Diagnosis not present

## 2018-06-11 DIAGNOSIS — K219 Gastro-esophageal reflux disease without esophagitis: Secondary | ICD-10-CM | POA: Diagnosis not present

## 2018-06-11 DIAGNOSIS — Z96652 Presence of left artificial knee joint: Secondary | ICD-10-CM | POA: Diagnosis not present

## 2018-06-11 DIAGNOSIS — I480 Paroxysmal atrial fibrillation: Secondary | ICD-10-CM | POA: Diagnosis not present

## 2018-06-11 DIAGNOSIS — M19012 Primary osteoarthritis, left shoulder: Secondary | ICD-10-CM | POA: Diagnosis not present

## 2018-06-11 DIAGNOSIS — E119 Type 2 diabetes mellitus without complications: Secondary | ICD-10-CM | POA: Diagnosis not present

## 2018-06-11 DIAGNOSIS — E785 Hyperlipidemia, unspecified: Secondary | ICD-10-CM | POA: Diagnosis not present

## 2018-06-11 DIAGNOSIS — Z471 Aftercare following joint replacement surgery: Secondary | ICD-10-CM | POA: Diagnosis not present

## 2018-06-13 DIAGNOSIS — E119 Type 2 diabetes mellitus without complications: Secondary | ICD-10-CM | POA: Diagnosis not present

## 2018-06-13 DIAGNOSIS — E785 Hyperlipidemia, unspecified: Secondary | ICD-10-CM | POA: Diagnosis not present

## 2018-06-13 DIAGNOSIS — I480 Paroxysmal atrial fibrillation: Secondary | ICD-10-CM | POA: Diagnosis not present

## 2018-06-13 DIAGNOSIS — K219 Gastro-esophageal reflux disease without esophagitis: Secondary | ICD-10-CM | POA: Diagnosis not present

## 2018-06-13 DIAGNOSIS — M19012 Primary osteoarthritis, left shoulder: Secondary | ICD-10-CM | POA: Diagnosis not present

## 2018-06-13 DIAGNOSIS — Z471 Aftercare following joint replacement surgery: Secondary | ICD-10-CM | POA: Diagnosis not present

## 2018-06-14 ENCOUNTER — Other Ambulatory Visit: Payer: Self-pay | Admitting: Primary Care

## 2018-06-14 DIAGNOSIS — E785 Hyperlipidemia, unspecified: Secondary | ICD-10-CM

## 2018-06-15 DIAGNOSIS — Z471 Aftercare following joint replacement surgery: Secondary | ICD-10-CM | POA: Diagnosis not present

## 2018-06-15 DIAGNOSIS — E119 Type 2 diabetes mellitus without complications: Secondary | ICD-10-CM | POA: Diagnosis not present

## 2018-06-15 DIAGNOSIS — M19012 Primary osteoarthritis, left shoulder: Secondary | ICD-10-CM | POA: Diagnosis not present

## 2018-06-15 DIAGNOSIS — I480 Paroxysmal atrial fibrillation: Secondary | ICD-10-CM | POA: Diagnosis not present

## 2018-06-15 DIAGNOSIS — K219 Gastro-esophageal reflux disease without esophagitis: Secondary | ICD-10-CM | POA: Diagnosis not present

## 2018-06-15 DIAGNOSIS — E785 Hyperlipidemia, unspecified: Secondary | ICD-10-CM | POA: Diagnosis not present

## 2018-06-16 ENCOUNTER — Other Ambulatory Visit: Payer: Self-pay | Admitting: Primary Care

## 2018-06-16 DIAGNOSIS — E119 Type 2 diabetes mellitus without complications: Secondary | ICD-10-CM

## 2018-06-16 DIAGNOSIS — K219 Gastro-esophageal reflux disease without esophagitis: Secondary | ICD-10-CM | POA: Diagnosis not present

## 2018-06-16 DIAGNOSIS — E785 Hyperlipidemia, unspecified: Secondary | ICD-10-CM | POA: Diagnosis not present

## 2018-06-16 DIAGNOSIS — I480 Paroxysmal atrial fibrillation: Secondary | ICD-10-CM | POA: Diagnosis not present

## 2018-06-16 DIAGNOSIS — M19012 Primary osteoarthritis, left shoulder: Secondary | ICD-10-CM | POA: Diagnosis not present

## 2018-06-16 DIAGNOSIS — Z471 Aftercare following joint replacement surgery: Secondary | ICD-10-CM | POA: Diagnosis not present

## 2018-06-19 ENCOUNTER — Other Ambulatory Visit: Payer: Self-pay | Admitting: Primary Care

## 2018-06-19 DIAGNOSIS — E119 Type 2 diabetes mellitus without complications: Secondary | ICD-10-CM | POA: Diagnosis not present

## 2018-06-19 DIAGNOSIS — Z471 Aftercare following joint replacement surgery: Secondary | ICD-10-CM | POA: Diagnosis not present

## 2018-06-19 DIAGNOSIS — E785 Hyperlipidemia, unspecified: Secondary | ICD-10-CM | POA: Diagnosis not present

## 2018-06-19 DIAGNOSIS — M19012 Primary osteoarthritis, left shoulder: Secondary | ICD-10-CM | POA: Diagnosis not present

## 2018-06-19 DIAGNOSIS — I480 Paroxysmal atrial fibrillation: Secondary | ICD-10-CM | POA: Diagnosis not present

## 2018-06-19 DIAGNOSIS — K219 Gastro-esophageal reflux disease without esophagitis: Secondary | ICD-10-CM | POA: Diagnosis not present

## 2018-06-21 DIAGNOSIS — E785 Hyperlipidemia, unspecified: Secondary | ICD-10-CM | POA: Diagnosis not present

## 2018-06-21 DIAGNOSIS — I480 Paroxysmal atrial fibrillation: Secondary | ICD-10-CM | POA: Diagnosis not present

## 2018-06-21 DIAGNOSIS — E119 Type 2 diabetes mellitus without complications: Secondary | ICD-10-CM | POA: Diagnosis not present

## 2018-06-21 DIAGNOSIS — K219 Gastro-esophageal reflux disease without esophagitis: Secondary | ICD-10-CM | POA: Diagnosis not present

## 2018-06-21 DIAGNOSIS — Z471 Aftercare following joint replacement surgery: Secondary | ICD-10-CM | POA: Diagnosis not present

## 2018-06-21 DIAGNOSIS — M19012 Primary osteoarthritis, left shoulder: Secondary | ICD-10-CM | POA: Diagnosis not present

## 2018-06-23 DIAGNOSIS — M25562 Pain in left knee: Secondary | ICD-10-CM | POA: Diagnosis not present

## 2018-06-26 DIAGNOSIS — M25562 Pain in left knee: Secondary | ICD-10-CM | POA: Diagnosis not present

## 2018-06-30 DIAGNOSIS — M25562 Pain in left knee: Secondary | ICD-10-CM | POA: Diagnosis not present

## 2018-07-03 DIAGNOSIS — M25562 Pain in left knee: Secondary | ICD-10-CM | POA: Diagnosis not present

## 2018-07-05 DIAGNOSIS — M25562 Pain in left knee: Secondary | ICD-10-CM | POA: Diagnosis not present

## 2018-07-07 DIAGNOSIS — M25562 Pain in left knee: Secondary | ICD-10-CM | POA: Diagnosis not present

## 2018-07-11 DIAGNOSIS — M25562 Pain in left knee: Secondary | ICD-10-CM | POA: Diagnosis not present

## 2018-07-13 DIAGNOSIS — M25562 Pain in left knee: Secondary | ICD-10-CM | POA: Diagnosis not present

## 2018-07-17 DIAGNOSIS — M25562 Pain in left knee: Secondary | ICD-10-CM | POA: Diagnosis not present

## 2018-07-19 ENCOUNTER — Other Ambulatory Visit: Payer: Self-pay | Admitting: Internal Medicine

## 2018-07-19 DIAGNOSIS — M25562 Pain in left knee: Secondary | ICD-10-CM | POA: Diagnosis not present

## 2018-07-21 DIAGNOSIS — M25562 Pain in left knee: Secondary | ICD-10-CM | POA: Diagnosis not present

## 2018-07-24 DIAGNOSIS — M1712 Unilateral primary osteoarthritis, left knee: Secondary | ICD-10-CM | POA: Diagnosis not present

## 2018-07-24 DIAGNOSIS — M25562 Pain in left knee: Secondary | ICD-10-CM | POA: Diagnosis not present

## 2018-07-26 DIAGNOSIS — M25562 Pain in left knee: Secondary | ICD-10-CM | POA: Diagnosis not present

## 2018-08-07 ENCOUNTER — Encounter: Payer: Medicare Other | Admitting: Internal Medicine

## 2018-09-19 DIAGNOSIS — Z96652 Presence of left artificial knee joint: Secondary | ICD-10-CM | POA: Diagnosis not present

## 2018-09-19 DIAGNOSIS — S76012D Strain of muscle, fascia and tendon of left hip, subsequent encounter: Secondary | ICD-10-CM | POA: Diagnosis not present

## 2018-10-11 ENCOUNTER — Other Ambulatory Visit: Payer: Self-pay | Admitting: Internal Medicine

## 2018-10-12 ENCOUNTER — Telehealth: Payer: Self-pay

## 2018-10-16 ENCOUNTER — Encounter: Payer: Self-pay | Admitting: Internal Medicine

## 2018-10-16 ENCOUNTER — Telehealth (INDEPENDENT_AMBULATORY_CARE_PROVIDER_SITE_OTHER): Payer: Medicare Other | Admitting: Internal Medicine

## 2018-10-16 VITALS — BP 122/62 | HR 68 | Ht 65.0 in | Wt 190.0 lb

## 2018-10-16 DIAGNOSIS — I480 Paroxysmal atrial fibrillation: Secondary | ICD-10-CM

## 2018-10-16 MED ORDER — ATENOLOL 25 MG PO TABS: 25.0000 mg | ORAL_TABLET | Freq: Every day | ORAL | 3 refills | Status: DC

## 2018-10-16 MED ORDER — FLECAINIDE ACETATE 50 MG PO TABS: 50.0000 mg | ORAL_TABLET | Freq: Two times a day (BID) | ORAL | 3 refills | Status: DC

## 2018-10-16 NOTE — Progress Notes (Signed)
Electrophysiology TeleHealth Note   Due to national recommendations of social distancing due to COVID 19, an audio/video telehealth visit is felt to be most appropriate for this patient at this time.  See MyChart message from today for the patient's consent to telehealth for [facility].   Date:  10/16/2018   ID:  Briah Nary, DOB 05-26-1945, MRN [id number]  Location: patient's home  Provider location: [address] [place]  Evaluation Performed: Follow-up visit  PCP:  Pleas Koch, NP  Electrophysiologist:  Dr Rayann Heman  Chief Complaint:  afib  History of Present Illness:    Francene Mcerlean is a 73 y.o. female who presents via audio/video conferencing for a telehealth visit today.  Since last being seen in our clinic, the patient reports doing very well.  She thinks that it has been several years since her last afib. Today, she denies symptoms of palpitations, chest pain, shortness of breath,  lower extremity edema, dizziness, presyncope, or syncope.  The patient is otherwise without complaint today.  The patient denies symptoms of fevers, chills, cough, or new SOB worrisome for COVID 19.  Past Medical History:  Diagnosis Date  . Cardiac arrhythmia due to congenital heart disease   . Diabetes mellitus without complication ([place])   . Diverticulosis   . DJD (degenerative joint disease)    frozen shoulder  . 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 GERD (gastroesophageal reflux disease)   . History of colonic polyps   . Hyperlipemia   . Hypertension   . Overweight   . Paroxysmal atrial fibrillation ([place]) 09/2000    Past Surgical History:  Procedure Laterality Date  . ABLATION  10/31/2007, 08/26/2008, 09/22/2012, 07/01/2015   AFib ablation x 4 in [place]  . CATARACT EXTRACTION Bilateral   . COLONOSCOPY    . CYSTECTOMY     subsebacous x 2  . DILATION AND CURETTAGE OF UTERUS  at age 85  . EYE SURGERY    . FOOT SURGERY Bilateral 2008, 2014   4  hammertoes and bunionectomy  . implantable loop recorder pacement  01/19/13   MDT Reveal LINQ implanted in [place] for afib management  . JOINT REPLACEMENT Left 07/2017   shoulder  . KNEE SURGERY Left 2016   arthoscopy   . LIGATION / DIVISION SAPHENOUS VEIN Bilateral   . POLYPECTOMY    . REVERSE SHOULDER ARTHROPLASTY Left 08/02/2017   Procedure: TOTAL SHOULDER ARTHROPLASTY VS. REVERSE;  Surgeon: Corky Mull, MD;  Location: [facility] ORS;  Service: Orthopedics;  Laterality: Left;  . TOTAL KNEE ARTHROPLASTY Left 06/08/2018   Procedure: TOTAL KNEE ARTHROPLASTY;  Surgeon: Corky Mull, MD;  Location: [facility] ORS;  Service: Orthopedics;  Laterality: Left;    Current Outpatient Medications  Medication Sig Dispense Refill  . Biotin 2.5 MG CAPS Take 2.5 mg by mouth daily.    . cholecalciferol (VITAMIN D) 1000 units tablet Take 1,000 Units by mouth daily.    . Cyanocobalamin (VITAMIN B-12 PO) Take 1 tablet by mouth daily.    . diclofenac sodium (VOLTAREN) 1 % GEL Apply 2 g topically 3 (three) times daily as needed (for knee pain).   1  . famotidine (PEPCID AC MAXIMUM STRENGTH) 20 MG tablet Take 20 mg by mouth at bedtime.     Marland Kitchen ibuprofen (ADVIL,MOTRIN) 800 MG tablet Take 800 mg by mouth every 8 (eight) hours as needed for headache or moderate pain.     Marland Kitchen [place]  OIL PO Take 350 mg by mouth at bedtime.    . metFORMIN (GLUCOPHAGE-XR) 500 MG 24 hr tablet TAKE 1 TABLET BY MOUTH EVERY DAY WITH BREAKFAST 90 tablet 1  . pravastatin (PRAVACHOL) 20 MG tablet Take 1 tablet (20 mg total) by mouth every evening. 90 tablet 3  . traMADol (ULTRAM) 50 MG tablet Take 1-2 tablets (50-100 mg total) by mouth every 6 (six) hours. 40 tablet 0  . traZODone (DESYREL) 50 MG tablet TAKE 1-2 TABLETS (50-100 MG TOTAL) BY MOUTH AT BEDTIME AS NEEDED FOR SLEEP. 180 tablet 0  . triamterene-hydrochlorothiazide (MAXZIDE-25) 37.5-25 MG tablet Take 0.5 tablets by mouth daily as needed (Edema).     . Turmeric 500 MG TABS Take 500 mg  by mouth daily.    Marland Kitchen atenolol (TENORMIN) 25 MG tablet Take 1 tablet (25 mg total) by mouth daily. 180 tablet 3  . flecainide (TAMBOCOR) 50 MG tablet Take 1 tablet (50 mg total) by mouth 2 (two) times daily. 180 tablet 3   Current Facility-Administered Medications  Medication Dose Route Frequency Provider Last Rate Last Dose  . 0.9 %  sodium chloride infusion  500 mL Intravenous Continuous Milus Banister, MD        Allergies:   Ambien [zolpidem tartrate]; Compazine [prochlorperazine edisylate]; Metoprolol; and Reglan [metoclopramide]   Social History:  The patient  reports that she has never smoked. She has never used smokeless tobacco. She reports that she does not drink alcohol or use drugs.   Family History:  The patient's  family history includes Atrial fibrillation in her brother and father; CAD in her brother and father; Cholelithiasis in her sister; Gallbladder disease in her sister; Glaucoma in her mother; Hypercholesterolemia in her brother; Hypertension in her brother and mother.   ROS:  Please see the history of present illness.   All other systems are personally reviewed and negative.    Exam:    Vital Signs:  BP 122/62   Pulse 68   Ht 5\' 5"  (1.651 m)   Wt 190 lb (86.2 kg)   BMI 31.62 kg/m   Well appearing, alert and conversant, regular work of breathing,  good skin color Eyes- anicteric, neuro- grossly intact, skin- no apparent rash or lesions or cyanosis, mouth- oral mucosa is pink   Labs/Other Tests and Data Reviewed:    Recent Labs: 02/15/2018: ALT 17 06/10/2018: BUN 22; Creatinine, Ser 0.60; Hemoglobin 11.2; Platelets 243; Potassium 3.8; Sodium 137   Wt Readings from Last 3 Encounters:  10/16/18 190 lb (86.2 kg)  06/08/18 194 lb 14.2 oz (88.4 kg)  05/24/18 195 lb (88.5 kg)     Other studies personally reviewed: Additional studies/ records that were reviewed today include: EP NPs last note, my priornotes  Review of the above records today demonstrates: as  above  She says that she will send me a Kardiamobile strip to review.    ASSESSMENT & PLAN:    1.  Paroxsymal atrial fibrillation Well controlled with flecainide and atenolol No AF in several years She does not wish to make any changes chads2vasc score is 4.  She will consider [place] if her AF burden increases  2. COVID 19 screen The patient denies symptoms of COVID 19 at this time.  The importance of social distancing was discussed today.  Follow-up: 12 months  Current medicines are reviewed at length with the patient today.   The patient does not have concerns regarding her medicines.  The following changes were made today:  none  Labs/ tests ordered today include:  No orders of the defined types were placed in this encounter.   Patient Risk:  after full review of this patients clinical status, I feel that they are at moderate risk at this time.  Today, I have spent 15 minutes with the patient with telehealth technology discussing afib .    SignedThompson Grayer, MD  10/16/2018 2:40 PM     [address] [place] [address] [phone] (office) [phone] (fax)

## 2018-10-18 ENCOUNTER — Other Ambulatory Visit: Payer: Self-pay

## 2018-10-18 MED ORDER — TRIAMTERENE-HCTZ 37.5-25 MG PO TABS: 0.5000 | ORAL_TABLET | Freq: Every day | ORAL | 11 refills | Status: DC | PRN

## 2018-11-12 ENCOUNTER — Other Ambulatory Visit: Payer: Self-pay | Admitting: Primary Care

## 2018-12-08 ENCOUNTER — Other Ambulatory Visit: Payer: Self-pay | Admitting: Primary Care

## 2018-12-08 DIAGNOSIS — E119 Type 2 diabetes mellitus without complications: Secondary | ICD-10-CM

## 2019-01-03 ENCOUNTER — Other Ambulatory Visit: Payer: Self-pay | Admitting: Internal Medicine

## 2019-02-15 DIAGNOSIS — Z23 Encounter for immunization: Secondary | ICD-10-CM | POA: Diagnosis not present

## 2019-02-19 ENCOUNTER — Other Ambulatory Visit: Payer: Self-pay | Admitting: Primary Care

## 2019-02-19 DIAGNOSIS — E119 Type 2 diabetes mellitus without complications: Secondary | ICD-10-CM

## 2019-02-19 DIAGNOSIS — E7849 Other hyperlipidemia: Secondary | ICD-10-CM

## 2019-02-21 ENCOUNTER — Other Ambulatory Visit (INDEPENDENT_AMBULATORY_CARE_PROVIDER_SITE_OTHER): Payer: Medicare Other

## 2019-02-21 ENCOUNTER — Ambulatory Visit: Payer: Medicare Other

## 2019-02-21 ENCOUNTER — Ambulatory Visit (INDEPENDENT_AMBULATORY_CARE_PROVIDER_SITE_OTHER): Payer: Medicare Other

## 2019-02-21 DIAGNOSIS — E7849 Other hyperlipidemia: Secondary | ICD-10-CM | POA: Diagnosis not present

## 2019-02-21 DIAGNOSIS — Z0000 Encounter for general adult medical examination without abnormal findings: Secondary | ICD-10-CM

## 2019-02-21 DIAGNOSIS — E119 Type 2 diabetes mellitus without complications: Secondary | ICD-10-CM

## 2019-02-21 LAB — HEMOGLOBIN A1C: Hgb A1c MFr Bld: 6.4 % (ref 4.6–6.5)

## 2019-02-21 LAB — CBC
HCT: 43.7 % (ref 36.0–46.0)
Hemoglobin: 14.7 g/dL (ref 12.0–15.0)
MCHC: 33.8 g/dL (ref 30.0–36.0)
MCV: 93.3 fl (ref 78.0–100.0)
Platelets: 291 10*3/uL (ref 150.0–400.0)
RBC: 4.68 Mil/uL (ref 3.87–5.11)
RDW: 12.7 % (ref 11.5–15.5)
WBC: 6 10*3/uL (ref 4.0–10.5)

## 2019-02-21 LAB — MICROALBUMIN / CREATININE URINE RATIO
Creatinine,U: 153.7 mg/dL
Microalb Creat Ratio: 0.6 mg/g (ref 0.0–30.0)
Microalb, Ur: 1 mg/dL (ref 0.0–1.9)

## 2019-02-21 LAB — LIPID PANEL
Cholesterol: 180 mg/dL (ref 0–200)
HDL: 47.8 mg/dL (ref >39.00)
NonHDL: 131.71
Total CHOL/HDL Ratio: 4
Triglycerides: 229 mg/dL — ABNORMAL HIGH (ref 0.0–149.0)
VLDL: 45.8 mg/dL — ABNORMAL HIGH (ref 0.0–40.0)

## 2019-02-21 LAB — COMPREHENSIVE METABOLIC PANEL
ALT: 21 U/L (ref 0–35)
AST: 19 U/L (ref 0–37)
Albumin: 4.4 g/dL (ref 3.5–5.2)
Alkaline Phosphatase: 55 U/L (ref 39–117)
BUN: 20 mg/dL (ref 6–23)
CO2: 31 mEq/L (ref 19–32)
Calcium: 9.5 mg/dL (ref 8.4–10.5)
Chloride: 99 mEq/L (ref 96–112)
Creatinine, Ser: 0.82 mg/dL (ref 0.40–1.20)
GFR: 68.34 mL/min (ref >60.00)
Glucose, Bld: 113 mg/dL — ABNORMAL HIGH (ref 70–99)
Potassium: 4 mEq/L (ref 3.5–5.1)
Sodium: 139 mEq/L (ref 135–145)
Total Bilirubin: 0.5 mg/dL (ref 0.2–1.2)
Total Protein: 7 g/dL (ref 6.0–8.3)

## 2019-02-21 LAB — LDL CHOLESTEROL, DIRECT: Direct LDL: 107 mg/dL

## 2019-02-21 NOTE — Progress Notes (Signed)
Subjective:   Kelsey Weaver is a 73 y.o. female who presents for Medicare Annual (Subsequent) preventive examination.  Review of Systems:    This visit is being conducted through telemedicine via telephone at the nurse health advisor's home address due to the COVID-19 pandemic. This patient has given me verbal consent via doximity to conduct this visit, patient states they are participating from their home address. Some vital signs may be absent or patient reported.    Patient identification: identified by name, DOB, and current address  Cardiac Risk Factors include: advanced age (>34men, >62 women);diabetes mellitus;dyslipidemia     Objective:     Vitals: There were no vitals taken for this visit.  There is no height or weight on file to calculate BMI.  Advanced Directives 02/21/2019 06/08/2018 05/24/2018 02/15/2018 08/02/2017 08/02/2017 07/19/2017  Does Patient Have a Medical Advance Directive? No No No No No Yes No  Does patient want to make changes to medical advance directive? - - - - No - Patient declined No - Patient declined -  Would patient like information on creating a medical advance directive? No - Patient declined No - Patient declined Yes (MAU/Ambulatory/Procedural Areas - Information given) Yes (MAU/Ambulatory/Procedural Areas - Information given) - No - Patient declined Yes (MAU/Ambulatory/Procedural Areas - Information given)    Tobacco Social History   Tobacco Use  Smoking Status Never Smoker  Smokeless Tobacco Never Used     Counseling given: Not Answered   Clinical Intake:  Pre-visit preparation completed: Yes  Pain : No/denies pain     Nutritional Risks: None Diabetes: Yes CBG done?: No Did pt. bring in CBG monitor from home?: No  How often do you need to have someone help you when you read instructions, pamphlets, or other written materials from your doctor or pharmacy?: 1 - Never What is the last grade level you completed in school?: Nursing degree   Interpreter Needed?: No  Information entered by :: CJohnson, LPN  Past Medical History:  Diagnosis Date  . Cardiac arrhythmia due to congenital heart disease   . Diabetes mellitus without complication ([place])   . Diverticulosis   . DJD (degenerative joint disease)    frozen shoulder  . 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 GERD (gastroesophageal reflux disease)   . History of colonic polyps   . Hyperlipemia   . Hypertension   . Overweight   . Paroxysmal atrial fibrillation ([place]) 09/2000   Past Surgical History:  Procedure Laterality Date  . ABLATION  10/31/2007, 08/26/2008, 09/22/2012, 07/01/2015   AFib ablation x 4 in [place]  . CATARACT EXTRACTION Bilateral   . COLONOSCOPY    . CYSTECTOMY     subsebacous x 2  . DILATION AND CURETTAGE OF UTERUS  at age 38  . EYE SURGERY    . FOOT SURGERY Bilateral 2008, 2014   4 hammertoes and bunionectomy  . implantable loop recorder pacement  01/19/13   MDT Reveal LINQ implanted in [place] for afib management  . JOINT REPLACEMENT Left 07/2017   shoulder  . KNEE SURGERY Left 2016   arthoscopy   . LIGATION / DIVISION SAPHENOUS VEIN Bilateral   . POLYPECTOMY    . REVERSE SHOULDER ARTHROPLASTY Left 08/02/2017   Procedure: TOTAL SHOULDER ARTHROPLASTY VS. REVERSE;  Surgeon: Corky Mull, MD;  Location: [facility] ORS;  Service: Orthopedics;  Laterality: Left;  . TOTAL KNEE ARTHROPLASTY Left 06/08/2018   Procedure: TOTAL KNEE ARTHROPLASTY;  Surgeon: Corky Mull, MD;  Location: [facility] ORS;  Service: Orthopedics;  Laterality: Left;   Family History  Problem Relation Age of Onset  . Hypertension Mother   . Glaucoma Mother   . CAD Father   . Atrial fibrillation Father   . CAD Brother   . Hypertension Brother   . Gallbladder disease Sister   . Atrial fibrillation Brother   . Hypercholesterolemia Brother   . Cholelithiasis Sister   . Colon cancer Neg Hx    Social History   Socioeconomic History  . Marital status: Married    Spouse name:  Not on file  . Number of children: 3  . Years of education: Not on file  . Highest education level: Not on file  Occupational History  . Occupation: Retired Animal nutritionist  . Financial resource strain: Not hard at all  . Food insecurity    Worry: Never true    Inability: Never true  . Transportation needs    Medical: No    Non-medical: No  Tobacco Use  . Smoking status: Never Smoker  . Smokeless tobacco: Never Used  Substance and Sexual Activity  . Alcohol use: No    Alcohol/week: 0.0 standard drinks  . Drug use: No  . Sexual activity: Not on file  Lifestyle  . Physical activity    Days per week: 0 days    Minutes per session: 0 min  . Stress: Not at all  Relationships  . Social Herbalist on phone: Not on file    Gets together: Not on file    Attends religious service: Not on file    Active member of club or organization: Not on file    Attends meetings of clubs or organizations: Not on file    Relationship status: Not on file  Other Topics Concern  . 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    Once worked in L&D and NICU.    Son is a Marine scientist at [employer] and also works for [employer] .   Enjoys spending time with her family.     Outpatient Encounter Medications as of 02/21/2019  Medication Sig  . acetaminophen (TYLENOL) 500 MG tablet Take 1,000 mg by mouth at bedtime as needed.  Marland Kitchen atenolol (TENORMIN) 25 MG tablet TAKE 1 TABLET (25 MG TOTAL) BY MOUTH AS NEEDED.  Marland Kitchen Biotin 2.5 MG CAPS Take 2.5 mg by mouth daily.  . cholecalciferol (VITAMIN D) 1000 units tablet Take 1,000 Units by mouth daily.  . Cyanocobalamin (VITAMIN B-12 PO) Take 1 tablet by mouth daily.  . diclofenac sodium (VOLTAREN) 1 % GEL Apply 2 g topically 3 (three) times daily as needed (for knee pain).   . famotidine (PEPCID AC MAXIMUM STRENGTH) 20 MG tablet Take 20 mg by mouth at bedtime.   . flecainide (TAMBOCOR) 50 MG tablet  TAKE 1 TABLET BY MOUTH TWICE A DAY AS NEEDED FOR AFIB  . ibuprofen (ADVIL,MOTRIN) 800 MG tablet Take 800 mg by mouth every 8 (eight) hours as needed for headache or moderate pain.   Marland Kitchen MEGARED OMEGA-3 KRILL OIL PO Take 350 mg by mouth at bedtime.  . metFORMIN (GLUCOPHAGE-XR) 500 MG 24 hr tablet TAKE 1 TABLET BY MOUTH EVERY DAY WITH BREAKFAST  . pravastatin (PRAVACHOL) 20 MG tablet Take 1 tablet (20 mg total) by mouth every evening.  . traMADol (ULTRAM) 50 MG tablet Take 1-2 tablets (50-100 mg total)  by mouth every 6 (six) hours.  . traZODone (DESYREL) 50 MG tablet TAKE 1-2 TABLETS (50-100 MG TOTAL) BY MOUTH AT BEDTIME AS NEEDED FOR SLEEP.  Marland Kitchen triamterene-hydrochlorothiazide (MAXZIDE-25) 37.5-25 MG tablet Take 0.5 tablets by mouth daily as needed (Edema).  . Turmeric 500 MG TABS Take 500 mg by mouth daily.   Facility-Administered Encounter Medications as of 02/21/2019  Medication  . 0.9 %  sodium chloride infusion    Activities of Daily Living In your present state of health, do you have any difficulty performing the following activities: 02/21/2019 06/08/2018  Hearing? N N  Vision? N N  Difficulty concentrating or making decisions? N N  Walking or climbing stairs? N N  Dressing or bathing? N N  Doing errands, shopping? N Y  Conservation officer, nature and eating ? N -  Using the Toilet? N -  In the past six months, have you accidently leaked urine? N -  Do you have problems with loss of bowel control? N -  Managing your Medications? N -  Managing your Finances? N -  Housekeeping or managing your Housekeeping? N -  Some recent data might be hidden    Patient Care Team: Pleas Koch, NP as PCP - General (Internal Medicine) Berenice Primas, MD as Referring Physician (Orthopedic Surgery)    Assessment:   This is a routine wellness examination for [facility].  Exercise Activities and Dietary recommendations Current Exercise Habits: The patient does not participate in regular exercise at  present, Exercise limited by: None identified  Goals    . Increase physical activity     Starting 02/15/2018, I will continue to walk 2-3 miles daily.     . Patient Stated     02/21/2019, I will start increasing my exercise regimen daily.        Fall Risk Fall Risk  02/21/2019 03/27/2018 02/15/2018 12/03/2016  Falls in the past year? 0 1 No No  Comment - Emmi Telephone Survey: data to providers prior to load - -  Number falls in past yr: - 1 - -  Comment - Emmi Telephone Survey Actual Response = 1 - -  Injury with Fall? - 0 - -  Risk for fall due to : Medication side effect - - -  Follow up Falls evaluation completed;Falls prevention discussed - - -   Is the patient's home free of loose throw rugs in walkways, pet beds, electrical cords, etc?   yes      Grab bars in the bathroom? yes      Handrails on the stairs?   yes      Adequate lighting?   yes  Timed Get Up and Go performed: N/A  Depression Screen PHQ 2/9 Scores 02/21/2019 02/15/2018 12/03/2016  PHQ - 2 Score 0 0 0  PHQ- 9 Score 0 0 -     Cognitive Function MMSE - Mini Mental State Exam 02/21/2019 02/15/2018 12/03/2016  Orientation to time 5 5 5   Orientation to Place 5 5 5   Registration 3 3 3   Attention/ Calculation 5 0 0  Recall 3 3 3   Language- name 2 objects - 0 0  Language- repeat 1 1 1   Language- follow 3 step command - 3 3  Language- read & follow direction - 0 0  Write a sentence - 0 0  Copy design - 0 0  Total score - 20 20  Mini Cog  Mini-Cog screen was completed. Maximum score is 22. A value of 0 denotes this part of  the MMSE was not completed or the patient failed this part of the Mini-Cog screening.      Immunization History  Administered Date(s) Administered  . Influenza, High Dose Seasonal PF 02/17/2016, 02/25/2017  . Influenza,inj,Quad PF,6+ Mos 02/15/2018  . Pneumococcal Conjugate-13 05/10/2014  . Pneumococcal Polysaccharide-23 02/13/2015  . Tdap 05/10/2010  . Zoster 06/10/2014  . Zoster  Recombinat (Shingrix) 05/15/2018, 07/10/2018    Qualifies for Shingles Vaccine? Series completed  Screening Tests Health Maintenance  Topic Date Due  . HEMOGLOBIN A1C  08/17/2018  . OPHTHALMOLOGY EXAM  01/27/2019  . URINE MICROALBUMIN  02/16/2019  . FOOT EXAM  02/23/2019  . MAMMOGRAM  06/08/2019  . TETANUS/TDAP  05/10/2020  . COLONOSCOPY  09/17/2021  . INFLUENZA VACCINE  Completed  . DEXA SCAN  Completed  . Hepatitis C Screening  Completed  . PNA vac Low Risk Adult  Completed    Cancer Screenings: Lung: Low Dose CT Chest recommended if Age 26-80 years, 30 pack-year currently smoking OR have quit w/in 15years. Patient does not qualify. Breast:  Up to date on Mammogram? Yes, completed 06/17/2017   Up to date of Bone Density/Dexa? Yes, completed 05/17/2016 Colorectal: completed 09/17/2016  Additional Screenings:  Hepatitis C Screening: 03/04/2016     Plan:    Patient wants to increase her exercise daily.    I have personally reviewed and noted the following in the patient's chart:   . Medical and social history . Use of alcohol, tobacco or illicit drugs  . Current medications and supplements . Functional ability and status . Nutritional status . Physical activity . Advanced directives . List of other physicians . Hospitalizations, surgeries, and ER visits in previous 12 months . Vitals . Screenings to include cognitive, depression, and falls . Referrals and appointments  In addition, I have reviewed and discussed with patient certain preventive protocols, quality metrics, and best practice recommendations. A written personalized care plan for preventive services as well as general preventive health recommendations were provided to patient.     Andrez Grime, LPN  [id number]

## 2019-02-21 NOTE — Progress Notes (Signed)
PCP notes: none  Health Maintenance: Diabetic eye exam scheduled for next month.     Abnormal Screenings: none    Patient concerns: Patient has some nail fungus and she would like to discuss starting a prescription med to help with this.     Nurse concerns: none    Next PCP appt.: 02/28/2019 @ 9:40 am

## 2019-02-21 NOTE — Patient Instructions (Signed)
Kelsey Weaver , Thank you for taking time to come for your Medicare Wellness Visit. I appreciate your ongoing commitment to your health goals. Please review the following plan we discussed and let me know if I can assist you in the future.   Screening recommendations/referrals: Colonoscopy: up to date, completed 09/17/2016 Mammogram: up to date, completed 06/17/2017 Bone Density: up to date, completed 05/17/2016 Recommended yearly ophthalmology/optometry visit for glaucoma screening and checkup Recommended yearly dental visit for hygiene and checkup  Vaccinations: Influenza vaccine: up to date, completed 02/15/2019  Pneumococcal vaccine: series completed  Tdap vaccine: up to date, completed 05/10/2010 Shingles vaccine: series completed   Advanced directives: Advance directive discussed with you today. Even though you declined this today please call our office should you change your mind and we can give you the proper paperwork for you to fill out.  Conditions/risks identified: diabetes, hyperlipidemia   Next appointment: 02/28/2019 @ 9:40 am    Preventive Care 65 Years and Older, Female Preventive care refers to lifestyle choices and visits with your health care provider that can promote health and wellness. What does preventive care include?  A yearly physical exam. This is also called an annual well check.  Dental exams once or twice a year.  Routine eye exams. Ask your health care provider how often you should have your eyes checked.  Personal lifestyle choices, including:  Daily care of your teeth and gums.  Regular physical activity.  Eating a healthy diet.  Avoiding tobacco and drug use.  Limiting alcohol use.  Practicing safe sex.  Taking low-dose aspirin every day.  Taking vitamin and mineral supplements as recommended by your health care provider. What happens during an annual well check? The services and screenings done by your health care provider during your annual  well check will depend on your age, overall health, lifestyle risk factors, and family history of disease. Counseling  Your health care provider may ask you questions about your:  Alcohol use.  Tobacco use.  Drug use.  Emotional well-being.  Home and relationship well-being.  Sexual activity.  Eating habits.  History of falls.  Memory and ability to understand (cognition).  Work and work Statistician.  Reproductive health. Screening  You may have the following tests or measurements:  Height, weight, and BMI.  Blood pressure.  Lipid and cholesterol levels. These may be checked every 5 years, or more frequently if you are over 28 years old.  Skin check.  Lung cancer screening. You may have this screening every year starting at age 64 if you have a 30-pack-year history of smoking and currently smoke or have quit within the past 15 years.  Fecal occult blood test (FOBT) of the stool. You may have this test every year starting at age 90.  Flexible sigmoidoscopy or colonoscopy. You may have a sigmoidoscopy every 5 years or a colonoscopy every 10 years starting at age 1.  Hepatitis C blood test.  Hepatitis B blood test.  Sexually transmitted disease (STD) testing.  Diabetes screening. This is done by checking your blood sugar (glucose) after you have not eaten for a while (fasting). You may have this done every 1-3 years.  Bone density scan. This is done to screen for osteoporosis. You may have this done starting at age 62.  Mammogram. This may be done every 1-2 years. Talk to your health care provider about how often you should have regular mammograms. Talk with your health care provider about your test results, treatment options, and  if necessary, the need for more tests. Vaccines  Your health care provider may recommend certain vaccines, such as:  Influenza vaccine. This is recommended every year.  Tetanus, diphtheria, and acellular pertussis (Tdap, Td) vaccine.  You may need a Td booster every 10 years.  Zoster vaccine. You may need this after age 25.  Pneumococcal 13-valent conjugate (PCV13) vaccine. One dose is recommended after age 20.  Pneumococcal polysaccharide (PPSV23) vaccine. One dose is recommended after age 48. Talk to your health care provider about which screenings and vaccines you need and how often you need them. This information is not intended to replace advice given to you by your health care provider. Make sure you discuss any questions you have with your health care provider. Document Released: 05/23/2015 Document Revised: 01/14/2016 Document Reviewed: 02/25/2015 Elsevier Interactive Patient Education  2017 [place] Prevention in the Home Falls can cause injuries. They can happen to people of all ages. There are many things you can do to make your home safe and to help prevent falls. What can I do on the outside of my home?  Regularly fix the edges of walkways and driveways and fix any cracks.  Remove anything that might make you trip as you walk through a door, such as a raised step or threshold.  Trim any bushes or trees on the path to your home.  Use bright outdoor lighting.  Clear any walking paths of anything that might make someone trip, such as rocks or tools.  Regularly check to see if handrails are loose or broken. Make sure that both sides of any steps have handrails.  Any raised decks and porches should have guardrails on the edges.  Have any leaves, snow, or ice cleared regularly.  Use sand or salt on walking paths during winter.  Clean up any spills in your garage right away. This includes oil or grease spills. What can I do in the bathroom?  Use night lights.  Install grab bars by the toilet and in the tub and shower. Do not use towel bars as grab bars.  Use non-skid mats or decals in the tub or shower.  If you need to sit down in the shower, use a plastic, non-slip stool.  Keep the  floor dry. Clean up any water that spills on the floor as soon as it happens.  Remove soap buildup in the tub or shower regularly.  Attach bath mats securely with double-sided non-slip rug tape.  Do not have throw rugs and other things on the floor that can make you trip. What can I do in the bedroom?  Use night lights.  Make sure that you have a light by your bed that is easy to reach.  Do not use any sheets or blankets that are too big for your bed. They should not hang down onto the floor.  Have a firm chair that has side arms. You can use this for support while you get dressed.  Do not have throw rugs and other things on the floor that can make you trip. What can I do in the kitchen?  Clean up any spills right away.  Avoid walking on wet floors.  Keep items that you use a lot in easy-to-reach places.  If you need to reach something above you, use a strong step stool that has a grab bar.  Keep electrical cords out of the way.  Do not use floor polish or wax that makes floors slippery. If you  must use wax, use non-skid floor wax.  Do not have throw rugs and other things on the floor that can make you trip. What can I do with my stairs?  Do not leave any items on the stairs.  Make sure that there are handrails on both sides of the stairs and use them. Fix handrails that are broken or loose. Make sure that handrails are as long as the stairways.  Check any carpeting to make sure that it is firmly attached to the stairs. Fix any carpet that is loose or worn.  Avoid having throw rugs at the top or bottom of the stairs. If you do have throw rugs, attach them to the floor with carpet tape.  Make sure that you have a light switch at the top of the stairs and the bottom of the stairs. If you do not have them, ask someone to add them for you. What else can I do to help prevent falls?  Wear shoes that:  Do not have high heels.  Have rubber bottoms.  Are comfortable and fit  you well.  Are closed at the toe. Do not wear sandals.  If you use a stepladder:  Make sure that it is fully opened. Do not climb a closed stepladder.  Make sure that both sides of the stepladder are locked into place.  Ask someone to hold it for you, if possible.  Clearly mark and make sure that you can see:  Any grab bars or handrails.  First and last steps.  Where the edge of each step is.  Use tools that help you move around (mobility aids) if they are needed. These include:  Canes.  Walkers.  Scooters.  Crutches.  Turn on the lights when you go into a dark area. Replace any light bulbs as soon as they burn out.  Set up your furniture so you have a clear path. Avoid moving your furniture around.  If any of your floors are uneven, fix them.  If there are any pets around you, be aware of where they are.  Review your medicines with your doctor. Some medicines can make you feel dizzy. This can increase your chance of falling. Ask your doctor what other things that you can do to help prevent falls. This information is not intended to replace advice given to you by your health care provider. Make sure you discuss any questions you have with your health care provider. Document Released: 02/20/2009 Document Revised: 10/02/2015 Document Reviewed: 05/31/2014 Elsevier Interactive Patient Education  2017 [employer].

## 2019-02-28 ENCOUNTER — Other Ambulatory Visit: Payer: Self-pay

## 2019-02-28 ENCOUNTER — Ambulatory Visit (INDEPENDENT_AMBULATORY_CARE_PROVIDER_SITE_OTHER): Payer: Medicare Other | Admitting: Primary Care

## 2019-02-28 ENCOUNTER — Encounter: Payer: Self-pay | Admitting: Primary Care

## 2019-02-28 VITALS — BP 128/74 | HR 74 | Temp 97.6°F | Ht 65.75 in | Wt 204.0 lb

## 2019-02-28 DIAGNOSIS — E119 Type 2 diabetes mellitus without complications: Secondary | ICD-10-CM

## 2019-02-28 DIAGNOSIS — B351 Tinea unguium: Secondary | ICD-10-CM | POA: Insufficient documentation

## 2019-02-28 DIAGNOSIS — I480 Paroxysmal atrial fibrillation: Secondary | ICD-10-CM | POA: Diagnosis not present

## 2019-02-28 DIAGNOSIS — S76012S Strain of muscle, fascia and tendon of left hip, sequela: Secondary | ICD-10-CM | POA: Diagnosis not present

## 2019-02-28 DIAGNOSIS — E7849 Other hyperlipidemia: Secondary | ICD-10-CM | POA: Diagnosis not present

## 2019-02-28 DIAGNOSIS — Z1231 Encounter for screening mammogram for malignant neoplasm of breast: Secondary | ICD-10-CM | POA: Diagnosis not present

## 2019-02-28 DIAGNOSIS — E2839 Other primary ovarian failure: Secondary | ICD-10-CM | POA: Diagnosis not present

## 2019-02-28 MED ORDER — PRAVASTATIN SODIUM 40 MG PO TABS: 40.0000 mg | ORAL_TABLET | Freq: Every evening | ORAL | 3 refills | Status: DC

## 2019-02-28 MED ORDER — TERBINAFINE HCL 250 MG PO TABS: 250.0000 mg | ORAL_TABLET | Freq: Every day | ORAL | 0 refills | Status: DC

## 2019-02-28 NOTE — Assessment & Plan Note (Signed)
Rate and rhythm regular today, following with cardiology. Compliant to regimen, continue same.

## 2019-02-28 NOTE — Assessment & Plan Note (Signed)
Noted bilaterally. Chronic. No improvement with OTC treatment.  Rx for terbinafine sent to pharmacy for 30 day course. Recent CMP unremarkable. Repeat liver enzymes in 30 days.  She will update.

## 2019-02-28 NOTE — Assessment & Plan Note (Signed)
LDL slightly above goal at 107. She cannot exercise much due to left hip pain. Increase pravastatin to 40 mg daily.

## 2019-02-28 NOTE — Progress Notes (Addendum)
Subjective:    Patient ID: Kelsey Weaver, female    DOB: 30-Dec-1945, 73 y.o.   MRN: [id number]  HPI  Ms. Kelsey Weaver is a 73 year old female who presents today for [place] Part 2. She would also like treatment for chronic toenail fungus.  Evaluated by podiatry in January 2020, note reviewed. She endorses that the podiatrist offered oral treatment for which she declined at the time. She is ready for treatment now as she's failed numerous OTC regimens.   Immunizations: -Tetanus: Completed in 2012 -Influenza: Completed in 2020 -Pneumonia: Completed last in 2016 -Shingles: Completed both Zostavax and Shingrix  Diet: She endorses a healthy diet. Mostly eating at home, little take out food. Desserts once weekly. Drinking mostly water and diet soda.  Exercise: She is not exercising.   Eye exam: Scheduled for November  Dental exam: Completes annually  Colonoscopy: Completed in 2018, due in 2023 Mammogram: Completed in 2019 Dexa: Completed in 2018 Hep C Screen: Negative   BP Readings from Last 3 Encounters:  02/28/19 128/74  10/16/18 122/62  06/10/18 (!) 137/59     Review of Systems  Constitutional: Negative for unexpected weight change.  HENT: Negative for rhinorrhea.   Respiratory: Negative for cough and shortness of breath.   Cardiovascular: Negative for chest pain.  Gastrointestinal: Negative for constipation and diarrhea.  Genitourinary: Negative for difficulty urinating.  Musculoskeletal: Positive for arthralgias.  Skin: Negative for rash.  Allergic/Immunologic: Negative for environmental allergies.  Neurological: Negative for dizziness, numbness and headaches.  Psychiatric/Behavioral: The patient is not nervous/anxious.        Past Medical History:  Diagnosis Date  . Cardiac arrhythmia due to congenital heart disease   . Diabetes mellitus without complication ([place])   . Diverticulosis   . DJD (degenerative joint disease)    frozen shoulder  . 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 GERD (gastroesophageal reflux disease)   . History of colonic polyps   . Hyperlipemia   . Hypertension   . Overweight   . Paroxysmal atrial fibrillation ([place]) 09/2000     Social History   Socioeconomic History  . Marital status: Married    Spouse name: Not on file  . Number of children: 3  . Years of education: Not on file  . Highest education level: Not on file  Occupational History  . Occupation: Retired Animal nutritionist  . Financial resource strain: Not hard at all  . Food insecurity    Worry: Never true    Inability: Never true  . Transportation needs    Medical: No    Non-medical: No  Tobacco Use  . Smoking status: Never Smoker  . Smokeless tobacco: Never Used  Substance and Sexual Activity  . Alcohol use: No    Alcohol/week: 0.0 standard drinks  . Drug use: No  . Sexual activity: Not on file  Lifestyle  . Physical activity    Days per week: 0 days    Minutes per session: 0 min  . Stress: Not at all  Relationships  . Social Herbalist on phone: Not on file    Gets together: Not on file    Attends religious service: Not on file    Active member of club or organization: Not on file    Attends meetings of clubs or organizations: Not on file    Relationship status: Not on file  . Intimate partner violence    Fear of current  or ex partner: No    Emotionally abused: No    Physically abused: No    Forced sexual activity: No  Other Topics Concern  . 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    Once worked in L&D and NICU.    Son is a Marine scientist at [employer] and also works for [employer] .   Enjoys spending time with her family.     Past Surgical History:  Procedure Laterality Date  . ABLATION  10/31/2007, 08/26/2008, 09/22/2012, 07/01/2015   AFib ablation x 4 in [place]  . CATARACT EXTRACTION Bilateral   . COLONOSCOPY    . CYSTECTOMY     subsebacous x 2  . DILATION  AND CURETTAGE OF UTERUS  at age 13  . EYE SURGERY    . FOOT SURGERY Bilateral 2008, 2014   4 hammertoes and bunionectomy  . implantable loop recorder pacement  01/19/13   MDT Reveal LINQ implanted in [place] for afib management  . JOINT REPLACEMENT Left 07/2017   shoulder  . KNEE SURGERY Left 2016   arthoscopy   . LIGATION / DIVISION SAPHENOUS VEIN Bilateral   . POLYPECTOMY    . REVERSE SHOULDER ARTHROPLASTY Left 08/02/2017   Procedure: TOTAL SHOULDER ARTHROPLASTY VS. REVERSE;  Surgeon: Corky Mull, MD;  Location: [facility] ORS;  Service: Orthopedics;  Laterality: Left;  . TOTAL KNEE ARTHROPLASTY Left 06/08/2018   Procedure: TOTAL KNEE ARTHROPLASTY;  Surgeon: Corky Mull, MD;  Location: [facility] ORS;  Service: Orthopedics;  Laterality: Left;    Family History  Problem Relation Age of Onset  . Hypertension Mother   . Glaucoma Mother   . CAD Father   . Atrial fibrillation Father   . CAD Brother   . Hypertension Brother   . Gallbladder disease Sister   . Atrial fibrillation Brother   . Hypercholesterolemia Brother   . Cholelithiasis Sister   . Colon cancer Neg Hx     Allergies  Allergen Reactions  . Ambien [Zolpidem Tartrate] Other (See Comments)    Flu like symptoms   . Compazine [Prochlorperazine Edisylate] Other (See Comments)    Aches and pains, generalized  . Metoprolol Other (See Comments)    Depression   . Reglan [Metoclopramide] Anxiety    Current Outpatient Medications on File Prior to Visit  Medication Sig Dispense Refill  . acetaminophen (TYLENOL) 500 MG tablet Take 1,000 mg by mouth at bedtime as needed.    Marland Kitchen atenolol (TENORMIN) 25 MG tablet TAKE 1 TABLET (25 MG TOTAL) BY MOUTH AS NEEDED. 90 tablet 2  . Biotin 5000 MCG CAPS Take by mouth.    . Cholecalciferol (VITAMIN D3) 125 MCG (5000 UT) CAPS Take by mouth.    . Cyanocobalamin (VITAMIN B-12 PO) Take 1 tablet by mouth daily.    . diclofenac sodium (VOLTAREN) 1 % GEL Apply 2 g topically 3 (three) times daily as needed  (for knee pain).   1  . famotidine (PEPCID AC MAXIMUM STRENGTH) 20 MG tablet Take 20 mg by mouth at bedtime.     . flecainide (TAMBOCOR) 50 MG tablet TAKE 1 TABLET BY MOUTH TWICE A DAY AS NEEDED FOR AFIB 180 tablet 2  . ibuprofen (ADVIL,MOTRIN) 800 MG tablet Take 800 mg by mouth every 8 (eight) hours as needed for headache or moderate pain.     Marland Kitchen MEGARED OMEGA-3 KRILL OIL PO Take 350 mg by mouth at bedtime.    Marland Kitchen  metFORMIN (GLUCOPHAGE-XR) 500 MG 24 hr tablet TAKE 1 TABLET BY MOUTH EVERY DAY WITH BREAKFAST 90 tablet 1  . Multiple Vitamins-Minerals (PRESERVISION AREDS 2) CAPS Take by mouth.    . pravastatin (PRAVACHOL) 20 MG tablet Take 1 tablet (20 mg total) by mouth every evening. 90 tablet 3  . Tea Tree Oil OIL by Does not apply route.    . traZODone (DESYREL) 50 MG tablet TAKE 1-2 TABLETS (50-100 MG TOTAL) BY MOUTH AT BEDTIME AS NEEDED FOR SLEEP. 180 tablet 1  . triamterene-hydrochlorothiazide (MAXZIDE-25) 37.5-25 MG tablet Take 0.5 tablets by mouth daily as needed (Edema). 30 tablet 11  . Turmeric 500 MG TABS Take 500 mg by mouth daily.     Current Facility-Administered Medications on File Prior to Visit  Medication Dose Route Frequency Provider Last Rate Last Dose  . 0.9 %  sodium chloride infusion  500 mL Intravenous Continuous Milus Banister, MD        BP 128/74   Pulse 74   Temp 97.6 F (36.4 C) (Temporal)   Ht 5' 5.75" (1.67 m)   Wt 204 lb (92.5 kg)   SpO2 98%   BMI 33.18 kg/m    Objective:   Physical Exam  Constitutional: She is oriented to person, place, and time. She appears well-nourished.  HENT:  Right Ear: Tympanic membrane and ear canal normal.  Left Ear: Tympanic membrane and ear canal normal.  Mouth/Throat: Oropharynx is clear and moist.  Eyes: Pupils are equal, round, and reactive to light. EOM are normal.  Neck: Neck supple.  Cardiovascular: Normal rate and regular rhythm.  Respiratory: Effort normal and breath sounds normal.  GI: Soft. Bowel sounds are  normal. There is no abdominal tenderness.  Musculoskeletal: Normal range of motion.  Neurological: She is alert and oriented to person, place, and time.  Skin: Skin is warm and dry.  Most toes of bilateral feet with yellow, thickened nails. Right moreso than left.  Psychiatric: She has a normal mood and affect.           Assessment & Plan:

## 2019-02-28 NOTE — Assessment & Plan Note (Signed)
Continued left hip pain, underwent MRI in November 2019 with evidence of bursitis.   Following with orthopedics.

## 2019-02-28 NOTE — Assessment & Plan Note (Signed)
A1C of 6.4 which is under great control. Continue metformin.  Managed on statin but LDL slightly above goal. Increase pravastatin to 40 mg.  Foot exam today. Eye exam due, she will have them send report. Urine microalbumin UTD.  Follow up in 6 months.

## 2019-02-28 NOTE — Patient Instructions (Addendum)
Call the breast center to schedule your bone density and mammogram.  Start exercising. You should be getting 150 minutes of exercise weekly.  Continue to work on a healthy diet. Ensure you are consuming 64 ounces of water daily.  We've increased your pravastatin dose to 40 mg, I sent a new prescription to the pharmacy.  Start terbinafine 250 mg capsules daily for toenail fungus.  Schedule a follow up visit virtually or in person for evaluation. We will need to repeat your labs in 30 days.  Please schedule a follow up appointment in 6 months for diabetes check.   It was a pleasure to see you today!

## 2019-02-28 NOTE — Addendum Note (Signed)
Addended by: Pleas Koch on: 02/28/2019 10:22 AM   Modules accepted: Orders

## 2019-03-21 ENCOUNTER — Other Ambulatory Visit: Payer: Self-pay | Admitting: Primary Care

## 2019-03-21 DIAGNOSIS — E7849 Other hyperlipidemia: Secondary | ICD-10-CM

## 2019-03-21 DIAGNOSIS — B351 Tinea unguium: Secondary | ICD-10-CM

## 2019-03-27 ENCOUNTER — Telehealth: Payer: Self-pay

## 2019-03-27 NOTE — Telephone Encounter (Signed)
LVM w COVID screen and back lab info 11.17.2020 TLJ

## 2019-03-30 ENCOUNTER — Other Ambulatory Visit (INDEPENDENT_AMBULATORY_CARE_PROVIDER_SITE_OTHER): Payer: Medicare Other

## 2019-03-30 DIAGNOSIS — B351 Tinea unguium: Secondary | ICD-10-CM | POA: Diagnosis not present

## 2019-03-30 DIAGNOSIS — E7849 Other hyperlipidemia: Secondary | ICD-10-CM | POA: Diagnosis not present

## 2019-03-30 LAB — HEPATIC FUNCTION PANEL
ALT: 18 U/L (ref 0–35)
AST: 16 U/L (ref 0–37)
Albumin: 4.4 g/dL (ref 3.5–5.2)
Alkaline Phosphatase: 61 U/L (ref 39–117)
Bilirubin, Direct: 0.1 mg/dL (ref 0.0–0.3)
Total Bilirubin: 0.4 mg/dL (ref 0.2–1.2)
Total Protein: 6.6 g/dL (ref 6.0–8.3)

## 2019-03-30 LAB — LIPID PANEL
Cholesterol: 130 mg/dL (ref 0–200)
HDL: 41.3 mg/dL (ref >39.00)
LDL Cholesterol: 66 mg/dL (ref 0–99)
NonHDL: 89.19
Total CHOL/HDL Ratio: 3
Triglycerides: 115 mg/dL (ref 0.0–149.0)
VLDL: 23 mg/dL (ref 0.0–40.0)

## 2019-04-02 ENCOUNTER — Ambulatory Visit (INDEPENDENT_AMBULATORY_CARE_PROVIDER_SITE_OTHER): Payer: Medicare Other | Admitting: Primary Care

## 2019-04-02 DIAGNOSIS — B351 Tinea unguium: Secondary | ICD-10-CM

## 2019-04-02 MED ORDER — TERBINAFINE HCL 250 MG PO TABS: 250.0000 mg | ORAL_TABLET | Freq: Every day | ORAL | 0 refills | Status: DC

## 2019-04-02 NOTE — Progress Notes (Signed)
Subjective:    Patient ID: Kelsey Weaver, female    DOB: [birth year], 73 y.o.   MRN: [id number]  HPI  Virtual Visit via Video Note  I connected with Kelsey Weaver on 04/02/19 at 11:40 AM EST by a video enabled telemedicine application and verified that I am speaking with the correct person using two identifiers.  Location: Patient: Home Provider: Office   I discussed the limitations of evaluation and management by telemedicine and the availability of in person appointments. The patient expressed understanding and agreed to proceed.  History of Present Illness:  [name] is a 73 year old female who presents today for follow up of onychomycosis.   She was last evaluated in late October with mentions of years of toenail fungus with achy feet. She had never under Rx treatment in the past so we initiated her on Lamisil. Since her last visit she's noticed a decrease in general aches to her feet. She's not taken her nail polish off since her prior visit so she can't tell if the discoloration has improved.  She does feel as though she's benefiting from treatment and would like to continue.    Observations/Objective:  Alert and oriented. Appears well, not sickly. No distress. Speaking in complete sentences.  Nail polish on toenails, unable to see her actual nail color.   Assessment and Plan:  Improved per patient since taking terbinafine. Initial and repeat LFT's unremarkable. Refill provided for terbinafine, will continue for a max of three months if needed. She will send a picture of her toenails once her polish has been removed.   Follow Up Instructions:  Continue taking terbinafine 250 mg capsules for toenail fungus.  Please send me a picture of your toenails without polish.  Please update me in another month, just send me a message via My Chart.  It was a pleasure to see you today! Kelsey Bossier, NP-C    I discussed the assessment and treatment plan with the patient.  The patient was provided an opportunity to ask questions and all were answered. The patient agreed with the plan and demonstrated an understanding of the instructions.   The patient was advised to call back or seek an in-person evaluation if the symptoms worsen or if the condition fails to improve as anticipated.   Pleas Koch, NP    Review of Systems  Musculoskeletal:       Improved foot pain/aches  Skin:       Unsure if toenail color has changed as she has not taken off nail polish       Past Medical History:  Diagnosis Date  . Cardiac arrhythmia due to congenital heart disease   . Diabetes mellitus without complication ([place])   . Diverticulosis   . DJD (degenerative joint disease)    frozen shoulder  . 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 GERD (gastroesophageal reflux disease)   . History of colonic polyps   . Hyperlipemia   . Hypertension   . Overweight   . Paroxysmal atrial fibrillation ([place]) 09/2000  . 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 Marital status: Married    Spouse name: Not on file  . Number of children: 3  . Years of education: Not on file  . Highest education level: Not on file  Occupational History  . Occupation:  Retired Animal nutritionist  . Financial resource strain: Not hard at all  . Food insecurity    Worry: Never true    Inability: Never true  . Transportation needs    Medical: No    Non-medical: No  Tobacco Use  . Smoking status: Never Smoker  . Smokeless tobacco: Never Used  Substance and Sexual Activity  . Alcohol use: No    Alcohol/week: 0.0 standard drinks  . Drug use: No  . Sexual activity: Not on file  Lifestyle  . Physical activity    Days per week: 0 days    Minutes per session: 0 min  . Stress: Not at all  Relationships  . Social Herbalist on phone:  Not on file    Gets together: Not on file    Attends religious service: Not on file    Active member of club or organization: Not on file    Attends meetings of clubs or organizations: Not on file    Relationship status: Not on file  . Intimate partner violence    Fear of current or ex partner: No    Emotionally abused: No    Physically abused: No    Forced sexual activity: No  Other Topics Concern  . 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    Once worked in L&D and NICU.    Son is a Marine scientist at [employer] and also works for [employer] .   Enjoys spending time with her family.     Past Surgical History:  Procedure Laterality Date  . ABLATION  10/31/2007, 08/26/2008, 09/22/2012, 07/01/2015   AFib ablation x 4 in [place]  . CATARACT EXTRACTION Bilateral   . COLONOSCOPY    . CYSTECTOMY     subsebacous x 2  . DILATION AND CURETTAGE OF UTERUS  at age 85  . EYE SURGERY    . FOOT SURGERY Bilateral 2008, 2014   4 hammertoes and bunionectomy  . implantable loop recorder pacement  01/19/13   MDT Reveal LINQ implanted in [place] for afib management  . JOINT REPLACEMENT Left 07/2017   shoulder  . KNEE SURGERY Left 2016   arthoscopy   . LIGATION / DIVISION SAPHENOUS VEIN Bilateral   . POLYPECTOMY    . REVERSE SHOULDER ARTHROPLASTY Left 08/02/2017   Procedure: TOTAL SHOULDER ARTHROPLASTY VS. REVERSE;  Surgeon: Corky Mull, MD;  Location: [facility] ORS;  Service: Orthopedics;  Laterality: Left;  . TOTAL KNEE ARTHROPLASTY Left 06/08/2018   Procedure: TOTAL KNEE ARTHROPLASTY;  Surgeon: Corky Mull, MD;  Location: [facility] ORS;  Service: Orthopedics;  Laterality: Left;    Family History  Problem Relation Age of Onset  . Hypertension Mother   . Glaucoma Mother   . CAD Father   . Atrial fibrillation Father   . CAD Brother   . Hypertension Brother   . Gallbladder disease Sister   . Atrial fibrillation Brother   . Hypercholesterolemia Brother    . Cholelithiasis Sister   . Colon cancer Neg Hx     Allergies  Allergen Reactions  . Ambien [Zolpidem Tartrate] Other (See Comments)    Flu like symptoms   . Compazine [Prochlorperazine Edisylate] Other (See Comments)    Aches and pains, generalized  . Metoprolol Other (See Comments)    Depression   . Reglan [Metoclopramide] Anxiety    Current Outpatient Medications on File Prior  to Visit  Medication Sig Dispense Refill  . acetaminophen (TYLENOL) 500 MG tablet Take 1,000 mg by mouth at bedtime as needed.    Marland Kitchen atenolol (TENORMIN) 25 MG tablet TAKE 1 TABLET (25 MG TOTAL) BY MOUTH AS NEEDED. 90 tablet 2  . Biotin 5000 MCG CAPS Take by mouth.    . Cholecalciferol (VITAMIN D3) 125 MCG (5000 UT) CAPS Take by mouth.    . Cyanocobalamin (VITAMIN B-12 PO) Take 1 tablet by mouth daily.    . diclofenac sodium (VOLTAREN) 1 % GEL Apply 2 g topically 3 (three) times daily as needed (for knee pain).   1  . famotidine (PEPCID AC MAXIMUM STRENGTH) 20 MG tablet Take 20 mg by mouth at bedtime.     . flecainide (TAMBOCOR) 50 MG tablet TAKE 1 TABLET BY MOUTH TWICE A DAY AS NEEDED FOR AFIB 180 tablet 2  . ibuprofen (ADVIL,MOTRIN) 800 MG tablet Take 800 mg by mouth every 8 (eight) hours as needed for headache or moderate pain.     Marland Kitchen MEGARED OMEGA-3 KRILL OIL PO Take 350 mg by mouth at bedtime.    . metFORMIN (GLUCOPHAGE-XR) 500 MG 24 hr tablet TAKE 1 TABLET BY MOUTH EVERY DAY WITH BREAKFAST 90 tablet 1  . Multiple Vitamins-Minerals (PRESERVISION AREDS 2) CAPS Take by mouth.    . pravastatin (PRAVACHOL) 40 MG tablet Take 1 tablet (40 mg total) by mouth every evening. For cholesterol. 90 tablet 3  . Tea Tree Oil OIL by Does not apply route.    . traZODone (DESYREL) 50 MG tablet TAKE 1-2 TABLETS (50-100 MG TOTAL) BY MOUTH AT BEDTIME AS NEEDED FOR SLEEP. 180 tablet 1  . triamterene-hydrochlorothiazide (MAXZIDE-25) 37.5-25 MG tablet Take 0.5 tablets by mouth daily as needed (Edema). 30 tablet 11  . Turmeric  500 MG TABS Take 500 mg by mouth daily.     Current Facility-Administered Medications on File Prior to Visit  Medication Dose Route Frequency Provider Last Rate Last Dose  . 0.9 %  sodium chloride infusion  500 mL Intravenous Continuous Milus Banister, MD        There were no vitals taken for this visit.   Objective:   Physical Exam  Constitutional: She is oriented to person, place, and time. She appears well-nourished.  Neurological: She is alert and oriented to person, place, and time.  Skin:  Darn brown/gray toenail polish in place  Psychiatric: She has a normal mood and affect.           Assessment & Plan:

## 2019-04-02 NOTE — Assessment & Plan Note (Signed)
Improved per patient since taking terbinafine. Initial and repeat LFT's unremarkable. Refill provided for terbinafine, will continue for a max of three months if needed. She will send a picture of her toenails once her polish has been removed

## 2019-04-02 NOTE — Patient Instructions (Signed)
Continue taking terbinafine 250 mg capsules for toenail fungus.  Please send me a picture of your toenails without polish.  Please update me in another month, just send me a message via My Chart.  It was a pleasure to see you today! Allie Bossier, NP-C

## 2019-04-10 DIAGNOSIS — E089 Diabetes mellitus due to underlying condition without complications: Secondary | ICD-10-CM | POA: Diagnosis not present

## 2019-04-10 DIAGNOSIS — Z961 Presence of intraocular lens: Secondary | ICD-10-CM | POA: Diagnosis not present

## 2019-04-10 DIAGNOSIS — H524 Presbyopia: Secondary | ICD-10-CM | POA: Diagnosis not present

## 2019-04-10 DIAGNOSIS — E119 Type 2 diabetes mellitus without complications: Secondary | ICD-10-CM | POA: Diagnosis not present

## 2019-04-10 LAB — HM DIABETES EYE EXAM

## 2019-04-11 ENCOUNTER — Ambulatory Visit: Payer: Medicare Other | Admitting: Podiatry

## 2019-04-19 ENCOUNTER — Encounter: Payer: Self-pay | Admitting: Primary Care

## 2019-04-24 DIAGNOSIS — B351 Tinea unguium: Secondary | ICD-10-CM

## 2019-04-24 MED ORDER — TERBINAFINE HCL 250 MG PO TABS: 250.0000 mg | ORAL_TABLET | Freq: Every day | ORAL | 0 refills | Status: DC

## 2019-04-25 ENCOUNTER — Other Ambulatory Visit: Payer: Self-pay | Admitting: Primary Care

## 2019-06-06 ENCOUNTER — Other Ambulatory Visit: Payer: Self-pay | Admitting: Primary Care

## 2019-06-06 DIAGNOSIS — E119 Type 2 diabetes mellitus without complications: Secondary | ICD-10-CM

## 2019-06-06 DIAGNOSIS — E785 Hyperlipidemia, unspecified: Secondary | ICD-10-CM

## 2019-06-07 ENCOUNTER — Ambulatory Visit
Admission: RE | Admit: 2019-06-07 | Discharge: 2019-06-07 | Disposition: A | Discharge status: Home / Self Care | Payer: Medicare Other | Source: Ambulatory Visit | Attending: Primary Care | Admitting: Primary Care

## 2019-06-07 ENCOUNTER — Ambulatory Visit: Payer: Medicare Other

## 2019-06-07 DIAGNOSIS — Z1231 Encounter for screening mammogram for malignant neoplasm of breast: Secondary | ICD-10-CM | POA: Diagnosis not present

## 2019-06-07 DIAGNOSIS — E2839 Other primary ovarian failure: Secondary | ICD-10-CM | POA: Diagnosis not present

## 2019-06-07 DIAGNOSIS — Z780 Asymptomatic menopausal state: Secondary | ICD-10-CM | POA: Diagnosis not present

## 2019-06-09 DIAGNOSIS — Z23 Encounter for immunization: Secondary | ICD-10-CM | POA: Diagnosis not present

## 2019-06-12 ENCOUNTER — Ambulatory Visit: Payer: Medicare Other

## 2019-06-16 ENCOUNTER — Ambulatory Visit: Payer: Medicare Other

## 2019-07-07 DIAGNOSIS — Z23 Encounter for immunization: Secondary | ICD-10-CM | POA: Diagnosis not present

## 2019-08-29 ENCOUNTER — Ambulatory Visit: Payer: Medicare Other | Admitting: Primary Care

## 2019-08-30 ENCOUNTER — Other Ambulatory Visit: Payer: Self-pay

## 2019-08-30 ENCOUNTER — Encounter: Payer: Self-pay | Admitting: Primary Care

## 2019-08-30 ENCOUNTER — Ambulatory Visit (INDEPENDENT_AMBULATORY_CARE_PROVIDER_SITE_OTHER): Payer: Medicare Other | Admitting: Primary Care

## 2019-08-30 VITALS — BP 120/82 | Temp 97.0°F | Ht 65.75 in | Wt 204.5 lb

## 2019-08-30 DIAGNOSIS — E119 Type 2 diabetes mellitus without complications: Secondary | ICD-10-CM | POA: Diagnosis not present

## 2019-08-30 DIAGNOSIS — E1140 Type 2 diabetes mellitus with diabetic neuropathy, unspecified: Secondary | ICD-10-CM | POA: Insufficient documentation

## 2019-08-30 DIAGNOSIS — G2581 Restless legs syndrome: Secondary | ICD-10-CM | POA: Insufficient documentation

## 2019-08-30 DIAGNOSIS — E1141 Type 2 diabetes mellitus with diabetic mononeuropathy: Secondary | ICD-10-CM

## 2019-08-30 LAB — POCT GLYCOSYLATED HEMOGLOBIN (HGB A1C): Hemoglobin A1C: 5.9 % — AB (ref 4.0–5.6)

## 2019-08-30 MED ORDER — GABAPENTIN 300 MG PO CAPS: 300.0000 mg | ORAL_CAPSULE | Freq: Every day | ORAL | 3 refills | Status: DC

## 2019-08-30 NOTE — Assessment & Plan Note (Signed)
Chronic and improved with gabapentin 300 mg HS. Rx sent to pharmacy.

## 2019-08-30 NOTE — Assessment & Plan Note (Signed)
Occurring at night for months, improved with gabapentin 300 mg HS. Prescription provided.

## 2019-08-30 NOTE — Patient Instructions (Signed)
Start gabapentin 300 mg at bedtime for neuropathy and restless legs. This will make you drowsy.  Continue metformin XR 500 mg for diabetes.  Work on exercise and a healthy diet. Ensure you are consuming 64 ounces of water daily.  Please schedule a physical with me and a Medicare Wellness Visit with our nurse in 6 months.   It was a pleasure to see you today!

## 2019-08-30 NOTE — Progress Notes (Signed)
Subjective:    Patient ID: Kelsey Weaver, female    DOB: 19-Nov-1945, 74 y.o.   MRN: [id number]  HPI  This visit occurred during the SARS-CoV-2 public health emergency.  Safety protocols were in place, including screening questions prior to the visit, additional usage of staff PPE, and extensive cleaning of exam room while observing appropriate contact time as indicated for disinfecting solutions.   Kelsey Weaver is a 74 year old female with a history of type 2 diabetes, atrial fibrillation, hyperlipidemia who presents today for follow up of diabetes. She would also like to discuss restless legs.  1) Type 2 Diabetes:  Current medications include: Metformin XR 500 mg.  She is checking her blood glucose 0 times daily.   Last A1C: 6.4 in October 2020 Last Eye Exam: UTD Last Foot Exam: UTD Pneumonia Vaccination: Completed last in 2016 ACE/ARB: None, urine microalbumin negative in October 2020 Statin: pravastatin   BP Readings from Last 3 Encounters:  08/30/19 120/82  02/28/19 128/74  10/16/18 122/62   2) Restless Legs: Chronic since left knee replacement since January 2020. Cannot get comfortable at night, has to move them around in order to get comfortable. She does have chronic numbness to the plantar feet from diabetes.   She is taking gabapentin 300 mg HS from an old prescription and is doing much better. She would like a prescription of her own.   Review of Systems  Respiratory: Negative for shortness of breath.   Cardiovascular: Negative for chest pain.  Musculoskeletal: Positive for arthralgias.  Neurological: Positive for numbness.       Past Medical History:  Diagnosis Date  . Cardiac arrhythmia due to congenital heart disease   . Diabetes mellitus without complication ([place])   . Diverticulosis   . DJD (degenerative joint disease)    frozen shoulder  . 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 GERD (gastroesophageal reflux disease)   . History of  colonic polyps   . Hyperlipemia   . Hypertension   . Overweight   . Paroxysmal atrial fibrillation ([place]) 09/2000  . 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 Marital status: Married    Spouse name: Not on file  . Number of children: 3  . Years of education: Not on file  . Highest education level: Not on file  Occupational History  . Occupation: Retired Therapist, sports  Tobacco Use  . Smoking status: Never Smoker  . Smokeless tobacco: Never Used  Substance and Sexual Activity  . Alcohol use: No    Alcohol/week: 0.0 standard drinks  . Drug use: No  . Sexual activity: Not on file  Other Topics Concern  . 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    Once worked in L&D and NICU.    Son is a Marine scientist at [employer] and also works for [employer] .   Enjoys spending time with her family.    Social Determinants of Health   Financial Resource Strain: Low Risk   . Difficulty of Paying Living Expenses: Not hard at all  Food Insecurity: No Food Insecurity  . Worried About Charity fundraiser in the Last Year: Never true  . Ran Out of Food in the Last Year: Never true  Transportation Needs:  No Transportation Needs  . Lack of Transportation (Medical): No  . Lack of Transportation (Non-Medical): No  Physical Activity: Inactive  . Days of Exercise per Week: 0 days  . Minutes of Exercise per Session: 0 min  Stress: No Stress Concern Present  . Feeling of Stress : Not at all  Social Connections:   . Frequency of Communication with Friends and Family:   . Frequency of Social Gatherings with Friends and Family:   . Attends Religious Services:   . Active Member of Clubs or Organizations:   . Attends Archivist Meetings:   Marland Kitchen Marital Status:   Intimate Partner Violence: Not At Risk  . Fear of  Current or Ex-Partner: No  . Emotionally Abused: No  . Physically Abused: No  . Sexually Abused: No    Past Surgical History:  Procedure Laterality Date  . ABLATION  10/31/2007, 08/26/2008, 09/22/2012, 07/01/2015   AFib ablation x 4 in [place]  . CATARACT EXTRACTION Bilateral   . COLONOSCOPY    . CYSTECTOMY     subsebacous x 2  . DILATION AND CURETTAGE OF UTERUS  at age 38  . EYE SURGERY    . FOOT SURGERY Bilateral 2008, 2014   4 hammertoes and bunionectomy  . implantable loop recorder pacement  01/19/13   MDT Reveal LINQ implanted in [place] for afib management  . JOINT REPLACEMENT Left 07/2017   shoulder  . KNEE SURGERY Left 2016   arthoscopy   . LIGATION / DIVISION SAPHENOUS VEIN Bilateral   . POLYPECTOMY    . REVERSE SHOULDER ARTHROPLASTY Left 08/02/2017   Procedure: TOTAL SHOULDER ARTHROPLASTY VS. REVERSE;  Surgeon: Corky Mull, MD;  Location: [facility] ORS;  Service: Orthopedics;  Laterality: Left;  . TOTAL KNEE ARTHROPLASTY Left 06/08/2018   Procedure: TOTAL KNEE ARTHROPLASTY;  Surgeon: Corky Mull, MD;  Location: [facility] ORS;  Service: Orthopedics;  Laterality: Left;    Family History  Problem Relation Age of Onset  . Hypertension Mother   . Glaucoma Mother   . CAD Father   . Atrial fibrillation Father   . CAD Brother   . Hypertension Brother   . Gallbladder disease Sister   . Atrial fibrillation Brother   . Hypercholesterolemia Brother   . Cholelithiasis Sister   . Colon cancer Neg Hx     Allergies  Allergen Reactions  . Ambien [Zolpidem Tartrate] Other (See Comments)    Flu like symptoms   . Compazine [Prochlorperazine Edisylate] Other (See Comments)    Aches and pains, generalized  . Metoprolol Other (See Comments)    Depression   . Reglan [Metoclopramide] Anxiety    Current Outpatient Medications on File Prior to Visit  Medication Sig Dispense Refill  . acetaminophen (TYLENOL) 500 MG tablet Take 1,000 mg by mouth at bedtime as needed.    Marland Kitchen atenolol (TENORMIN) 25 MG  tablet TAKE 1 TABLET (25 MG TOTAL) BY MOUTH AS NEEDED. 90 tablet 2  . Biotin 5000 MCG CAPS Take by mouth.    . Cholecalciferol (VITAMIN D3) 125 MCG (5000 UT) CAPS Take by mouth.    . Cyanocobalamin (VITAMIN B-12 PO) Take 1 tablet by mouth daily.    . diclofenac sodium (VOLTAREN) 1 % GEL Apply 2 g topically 3 (three) times daily as needed (for knee pain).   1  . famotidine (PEPCID AC MAXIMUM STRENGTH) 20 MG tablet Take 20 mg by mouth at bedtime.     . flecainide (TAMBOCOR) 50 MG tablet TAKE 1 TABLET  BY MOUTH TWICE A DAY AS NEEDED FOR AFIB 180 tablet 2  . ibuprofen (ADVIL,MOTRIN) 800 MG tablet Take 800 mg by mouth every 8 (eight) hours as needed for headache or moderate pain.     Marland Kitchen MEGARED OMEGA-3 KRILL OIL PO Take 350 mg by mouth at bedtime.    . metFORMIN (GLUCOPHAGE-XR) 500 MG 24 hr tablet TAKE 1 TABLET BY MOUTH EVERY DAY WITH BREAKFAST 90 tablet 1  . Multiple Vitamins-Minerals (PRESERVISION AREDS 2) CAPS Take by mouth.    . pravastatin (PRAVACHOL) 40 MG tablet Take 1 tablet (40 mg total) by mouth every evening. For cholesterol. 90 tablet 3  . Tea Tree Oil OIL by Does not apply route.    . terbinafine (LAMISIL) 250 MG tablet Take 1 tablet (250 mg total) by mouth daily. For nail fungus 30 tablet 0  . traZODone (DESYREL) 50 MG tablet TAKE 1-2 TABLETS (50-100 MG TOTAL) BY MOUTH AT BEDTIME AS NEEDED FOR SLEEP. 180 tablet 1  . triamterene-hydrochlorothiazide (MAXZIDE-25) 37.5-25 MG tablet Take 0.5 tablets by mouth daily as needed (Edema). 30 tablet 11  . Turmeric 500 MG TABS Take 500 mg by mouth daily.     Current Facility-Administered Medications on File Prior to Visit  Medication Dose Route Frequency Provider Last Rate Last Admin  . 0.9 %  sodium chloride infusion  500 mL Intravenous Continuous Milus Banister, MD        BP 120/82   Temp (!) 97 F (36.1 C) (Temporal)   Ht 5' 5.75" (1.67 m)   Wt 204 lb 8 oz (92.8 kg)   SpO2 98%   BMI 33.26 kg/m    Objective:   Physical Exam    Constitutional: She appears well-nourished.  Cardiovascular: Normal rate and regular rhythm.  Respiratory: Effort normal and breath sounds normal.  Musculoskeletal:     Cervical back: Neck supple.  Skin: Skin is warm and dry.  Psychiatric: She has a normal mood and affect.           Assessment & Plan:

## 2019-08-30 NOTE — Assessment & Plan Note (Addendum)
Well controlled with A1C of 5.9 today.  Continue Metformin XR 500 mg. Managed on statin. Urine microalbumin negative in October 2020. Pneumonia vaccination UTD. Foot and eye exam UTD.  Rx for gabapentin prescribed for neuropathy to feet and restless legs.  Follow up in 6 months.

## 2019-10-11 IMAGING — MR MR KNEE*L* W/O CM
6 series · 40 of 40 positions shown · non-contrast
Comparison: None.

CLINICAL DATA: Chronic left knee pain. Left knee is locking up for
6 months.

EXAM:
MRI OF THE LEFT KNEE WITHOUT CONTRAST
TECHNIQUE: Multiplanar, multisequence MR imaging of the knee was performed. No
intravenous contrast was administered.

[Series 8: T2 fat-sat · axial · left · 4.0mm · 0.50mm/px · z∈[-101,+23]mm · 6 of 26 slices shown (1 of 3)]
[im 1/26]
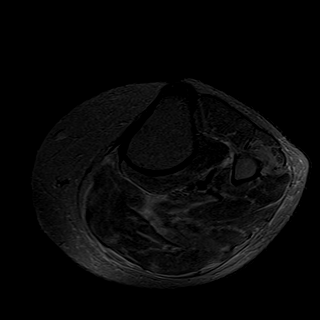
[im 6/26]
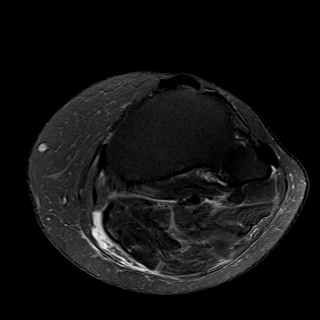
[im 11/26]
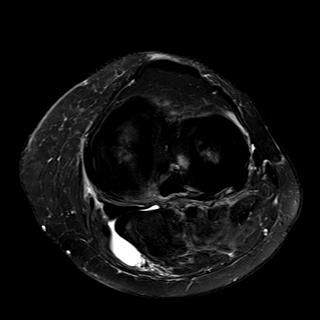
[im 16/26]
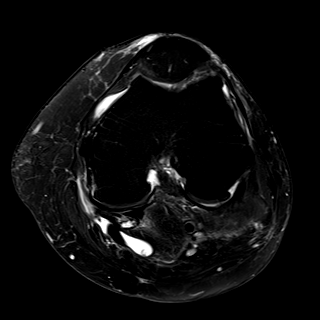
[im 21/26]
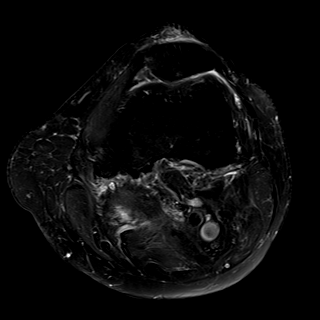
[im 26/26]
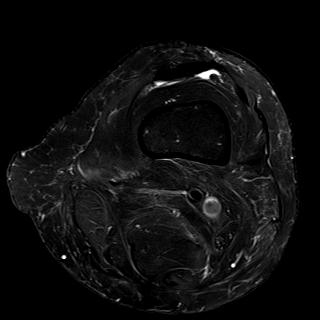

[Series 9: T1 · coronal · left · 4.0mm · 0.59mm/px · 6 of 24 slices shown]
[im 1/24]
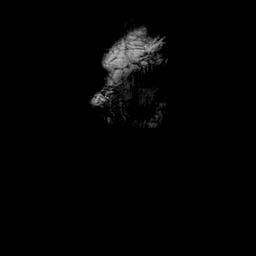
[im 5/24]
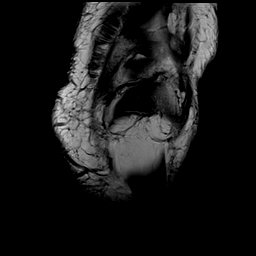
[im 10/24]
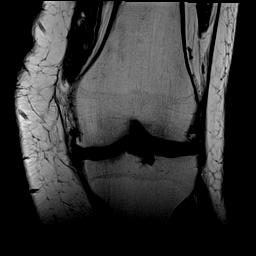
[im 14/24]
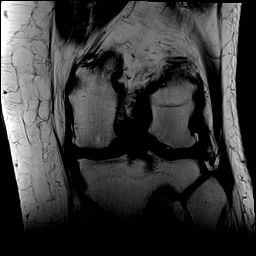
[im 19/24]
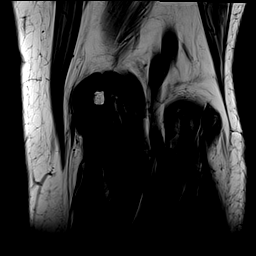
[im 24/24]
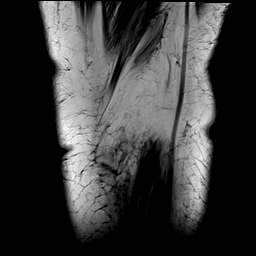

[Series 10: T2 fat-sat · coronal · left · 4.0mm · 0.59mm/px · 6 of 23 slices shown (2 of 3)]
[im 1/23]
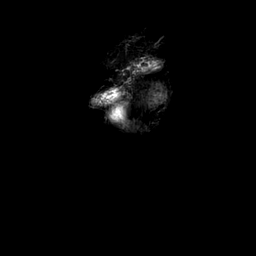
[im 5/23]
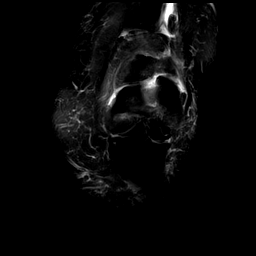
[im 9/23]
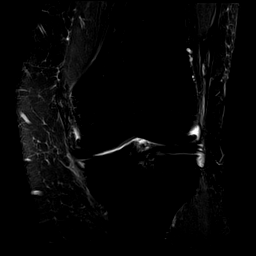
[im 14/23]
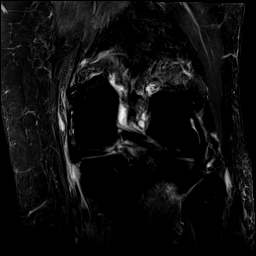
[im 18/23]
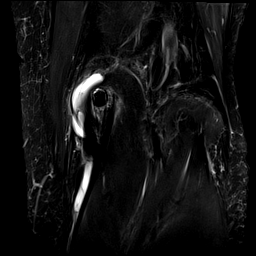
[im 23/23]
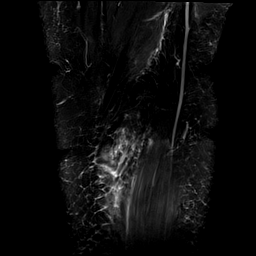

[Series 11: PD fat-sat · coronal · left · 4.0mm · 0.59mm/px · 6 of 24 slices shown (1 of 2)]
[im 1/24]
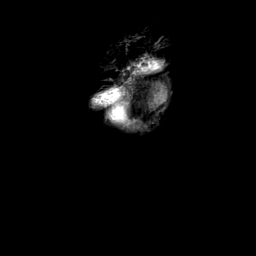
[im 5/24]
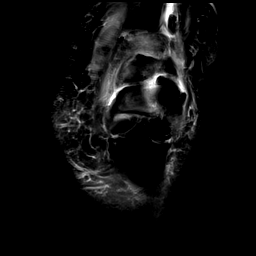
[im 10/24]
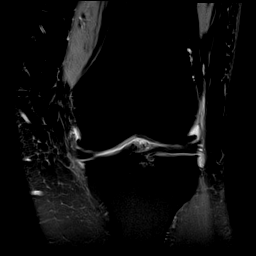
[im 14/24]
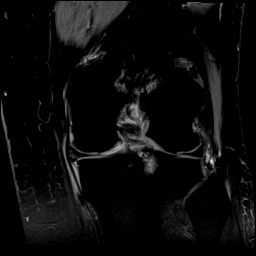
[im 19/24]
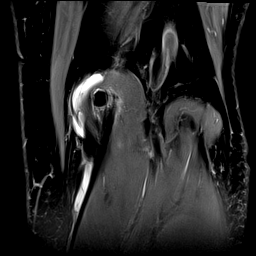
[im 24/24]
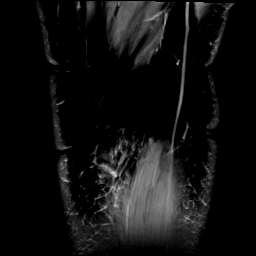

[Series 12: PD fat-sat · sagittal · left · 3.0mm · 0.59mm/px · 8 of 30 slices shown (2 of 2)]
[im 1/30]
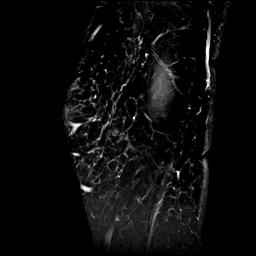
[im 5/30]
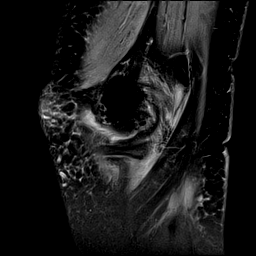
[im 9/30]
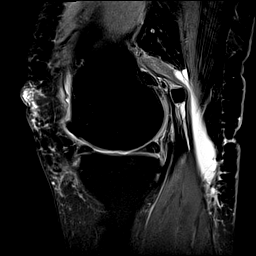
[im 13/30]
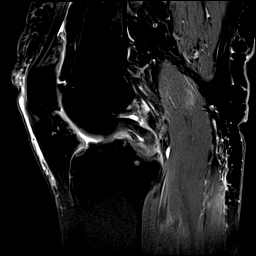
[im 17/30]
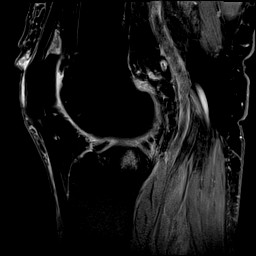
[im 21/30]
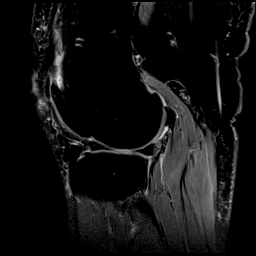
[im 25/30]
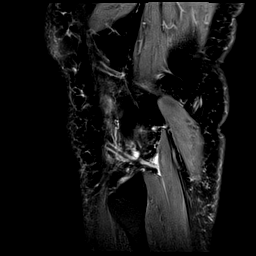
[im 30/30]
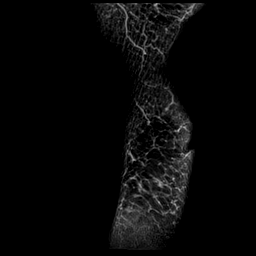

[Series 13: T2 fat-sat · sagittal · left · 3.0mm · 0.59mm/px · 8 of 30 slices shown (3 of 3)]
[im 1/30]
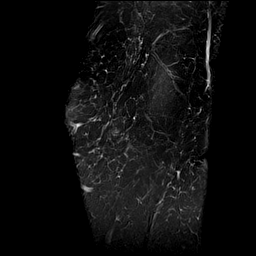
[im 5/30]
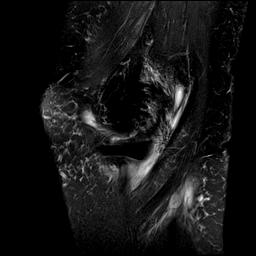
[im 9/30]
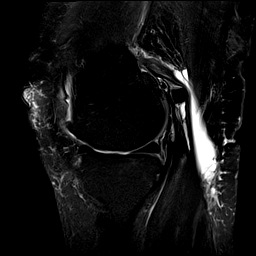
[im 13/30]
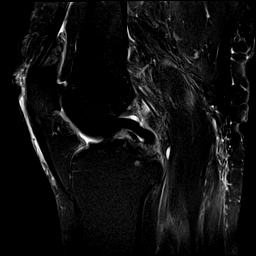
[im 17/30]
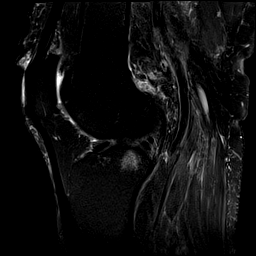
[im 21/30]
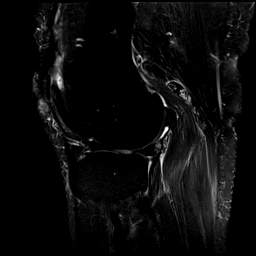
[im 25/30]
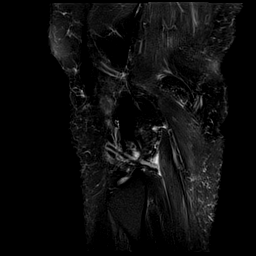
[im 30/30]
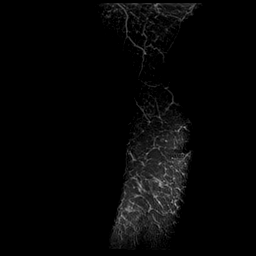

[40 of 40 positions shown; findings below may reference images not displayed]

FINDINGS: MENISCI

Medial meniscus: Degeneration of the posterior horn of the medial
meniscus. Radial tear of the posterior horn of the medial meniscus
at the root with peripheral meniscal extrusion.

Lateral meniscus: Radial tear of the posterior horn of the lateral
meniscus adjacent to the root. Tiny radial tear at the free edge of
the body of the lateral meniscus.

LIGAMENTS

Cruciates: Intact ACL. Mild thickening of the distal PCL likely
reflecting mild degeneration. PCL is otherwise intact. Small
ganglion cyst along the posterior margin of the ACL origin.

Collaterals: Medial collateral ligament is intact. Lateral
collateral ligament complex is intact.

CARTILAGE

Patellofemoral: High-grade partial-thickness cartilage loss with
areas of full-thickness cartilage loss of the lateral patellofemoral
compartment. Partial-thickness cartilage loss of the medial
patellofemoral compartment.

Medial: High-grade partial-thickness cartilage loss of the medial
femorotibial compartment with small marginal osteophytes.

Lateral: 8 mm area of high-grade partial-thickness cartilage loss of
medial femoral condyle and medial tibial plateau. Small marginal
osteophytes.

Joint: Small joint effusion. Normal Hoffa's fat. No plical
thickening.

Popliteal Fossa:  Small Baker's cyst.  Intact popliteus tendon.

Extensor Mechanism: Intact quadriceps tendon. Intact patellar
tendon. Intact medial patellar retinaculum. Intact lateral patellar
retinaculum. Intact MPFL.

Bones: No acute osseous abnormality. No aggressive osseous lesion.
Subcortical reactive marrow changes at the root of the posterior
horn of the lateral meniscus.

Other: No fluid collection or hematoma.  Muscles are normal.
IMPRESSION: 1. Radial tear of the posterior horn of the medial meniscus at the
root with peripheral meniscal extrusion.
2. Radial tear of the posterior horn of the lateral meniscus
adjacent to the root. Tiny radial tear at the free edge of the body
of the lateral meniscus.
3. Tricompartmental cartilage abnormalities as described above.

## 2019-10-18 ENCOUNTER — Telehealth: Payer: Self-pay

## 2019-10-18 NOTE — Telephone Encounter (Signed)
Spoke with pt regarding appt on 10/22/19. Pt stated she did not have any questions at this time. Pt was advise to check her vitals prior to visit. Pt agreed and confirmed virtual visit.

## 2019-10-22 ENCOUNTER — Telehealth (INDEPENDENT_AMBULATORY_CARE_PROVIDER_SITE_OTHER): Payer: Medicare Other | Admitting: Internal Medicine

## 2019-10-22 ENCOUNTER — Encounter: Payer: Self-pay | Admitting: Internal Medicine

## 2019-10-22 VITALS — BP 118/64 | HR 74 | Ht 66.0 in | Wt 195.0 lb

## 2019-10-22 DIAGNOSIS — I10 Essential (primary) hypertension: Secondary | ICD-10-CM

## 2019-10-22 DIAGNOSIS — E7800 Pure hypercholesterolemia, unspecified: Secondary | ICD-10-CM

## 2019-10-22 DIAGNOSIS — I480 Paroxysmal atrial fibrillation: Secondary | ICD-10-CM

## 2019-10-22 NOTE — Progress Notes (Signed)
Electrophysiology TeleHealth Note   Due to national recommendations of social distancing due to COVID 19, an audio/video telehealth visit is felt to be most appropriate for this patient at this time.  See MyChart message from today for the patient's consent to telehealth for [facility].  Date:  10/22/2019   ID:  Kelsey Weaver, DOB [birth year], MRN [id number]  Location: patient's home  Provider location:  [place]  Evaluation Performed: Follow-up visit  PCP:  Pleas Koch, NP   Electrophysiologist:  Dr Rayann Heman  Chief Complaint:  palpitations  History of Present Illness:    Kelsey Weaver is a 74 y.o. female who presents via telehealth conferencing today.  Since last being seen in our clinic, the patient reports doing very well.  She has not had afib in several years.  Today, she denies symptoms of palpitations, chest pain, shortness of breath,  lower extremity edema, dizziness, presyncope, or syncope.  The patient is otherwise without complaint today.   Past Medical History:  Diagnosis Date  . Cardiac arrhythmia due to congenital heart disease   . Diabetes mellitus without complication ([place])   . Diverticulosis   . DJD (degenerative joint disease)    frozen shoulder  . 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 GERD (gastroesophageal reflux disease)   . History of colonic polyps   . Hyperlipemia   . Hypertension   . Overweight   . Paroxysmal atrial fibrillation ([place]) 09/2000  . 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 ABLATION  10/31/2007, 08/26/2008, 09/22/2012, 07/01/2015   AFib ablation x 4 in [place]  . CATARACT EXTRACTION Bilateral   . COLONOSCOPY    . CYSTECTOMY     subsebacous x 2  . DILATION AND CURETTAGE OF UTERUS  at age 74  . EYE SURGERY    . FOOT SURGERY Bilateral 2008, 2014   4 hammertoes and  bunionectomy  . implantable loop recorder pacement  01/19/13   MDT Reveal LINQ implanted in [place] for afib management  . JOINT REPLACEMENT Left 07/2017   shoulder  . KNEE SURGERY Left 2016   arthoscopy   . LIGATION / DIVISION SAPHENOUS VEIN Bilateral   . POLYPECTOMY    . REVERSE SHOULDER ARTHROPLASTY Left 08/02/2017   Procedure: TOTAL SHOULDER ARTHROPLASTY VS. REVERSE;  Surgeon: Corky Mull, MD;  Location: [facility] ORS;  Service: Orthopedics;  Laterality: Left;  . TOTAL KNEE ARTHROPLASTY Left 06/08/2018   Procedure: TOTAL KNEE ARTHROPLASTY;  Surgeon: Corky Mull, MD;  Location: [facility] ORS;  Service: Orthopedics;  Laterality: Left;    Current Outpatient Medications  Medication Sig Dispense Refill  . atenolol (TENORMIN) 25 MG tablet Take 25 mg by mouth daily.    . Cholecalciferol (VITAMIN D3) 125 MCG (5000 UT) CAPS Take 1 capsule by mouth daily.     . Cyanocobalamin (VITAMIN B-12 PO) Take 1 tablet by mouth daily.    . diclofenac sodium (VOLTAREN) 1 % GEL Apply 2 g topically 3 (three) times daily as needed (for knee pain).   1  . famotidine (PEPCID AC MAXIMUM STRENGTH) 20 MG tablet Take 20 mg by mouth at bedtime.     . flecainide (TAMBOCOR) 50 MG tablet Take 50 mg by mouth 2 (two) times daily.    Marland Kitchen gabapentin (NEURONTIN) 300 MG capsule Take 1 capsule (300 mg total)  by mouth at bedtime. For neuropathy and restless legs. 90 capsule 3  . ibuprofen (ADVIL,MOTRIN) 800 MG tablet Take 800 mg by mouth every 8 (eight) hours as needed for headache or moderate pain.     . metFORMIN (GLUCOPHAGE-XR) 500 MG 24 hr tablet TAKE 1 TABLET BY MOUTH EVERY DAY WITH BREAKFAST 90 tablet 1  . Multiple Vitamins-Minerals (PRESERVISION AREDS 2) CAPS Take 1 tablet by mouth daily.     . pravastatin (PRAVACHOL) 40 MG tablet Take 1 tablet (40 mg total) by mouth every evening. For cholesterol. 90 tablet 3  . traZODone (DESYREL) 50 MG tablet TAKE 1-2 TABLETS (50-100 MG TOTAL) BY MOUTH AT BEDTIME AS NEEDED FOR SLEEP. 180 tablet 1  .  triamterene-hydrochlorothiazide (MAXZIDE-25) 37.5-25 MG tablet Take 0.5 tablets by mouth daily as needed (Edema). 30 tablet 11  . Turmeric 500 MG TABS Take 500 mg by mouth daily.     Current Facility-Administered Medications  Medication Dose Route Frequency Provider Last Rate Last Admin  . 0.9 %  sodium chloride infusion  500 mL Intravenous Continuous Milus Banister, MD        Allergies:   Ambien [zolpidem tartrate], Compazine [prochlorperazine edisylate], Metoprolol, and Reglan [metoclopramide]   Social History:  The patient  reports that she has never smoked. She has never used smokeless tobacco. She reports that she does not drink alcohol and does not use drugs.   ROS:  Please see the history of present illness.   All other systems are personally reviewed and negative.    Exam:    Vital Signs:  BP 118/64   Pulse 74   Ht 5\' 6"  (1.676 m)   Wt 195 lb (88.5 kg)   BMI 31.47 kg/m   Well sounding and appearing, alert and conversant, regular work of breathing,  good skin color Eyes- anicteric, neuro- grossly intact, skin- no apparent rash or lesions or cyanosis, mouth- oral mucosa is pink  Labs/Other Tests and Data Reviewed:    Recent Labs: 02/21/2019: BUN 20; Creatinine, Ser 0.82; Hemoglobin 14.7; Platelets 291.0; Potassium 4.0; Sodium 139 03/30/2019: ALT 18   Wt Readings from Last 3 Encounters:  10/22/19 195 lb (88.5 kg)  08/30/19 204 lb 8 oz (92.8 kg)  02/28/19 204 lb (92.5 kg)       ASSESSMENT & PLAN:    1.  Paroxysmal atrial fibrillation She has not had afib in a number of years. Wishes to stay on flecainide and atenolol chads2vasc score is 4.  Does not wish to take [facility] unless her AF burden increases We discussed the importance of close follow-up on flecainide to avoid toxicity with this medicine.  2. HTN Stable No change required today  3. HL Continue pravachol   Risks, benefits and potential toxicities for medications prescribed and/or refilled reviewed  with patient today.   Follow-up:  12 months with me   Patient Risk:  after full review of this patients clinical status, I feel that they are at moderate risk at this time.  Today, I have spent 15 minutes with the patient with telehealth technology discussing arrhythmia management .    Army Fossa, MD  10/22/2019 9:42 AM     [address] [place]  [address] [phone] (office) [phone] (fax)

## 2019-10-26 ENCOUNTER — Other Ambulatory Visit: Payer: Self-pay | Admitting: Primary Care

## 2019-10-27 ENCOUNTER — Other Ambulatory Visit: Payer: Self-pay | Admitting: Internal Medicine

## 2019-10-29 MED ORDER — ATENOLOL 25 MG PO TABS: 25.0000 mg | ORAL_TABLET | Freq: Every day | ORAL | 3 refills | Status: DC

## 2019-10-29 MED ORDER — FLECAINIDE ACETATE 50 MG PO TABS: 50.0000 mg | ORAL_TABLET | Freq: Two times a day (BID) | ORAL | 3 refills | Status: DC

## 2019-11-28 ENCOUNTER — Other Ambulatory Visit: Payer: Self-pay | Admitting: Internal Medicine

## 2019-12-02 ENCOUNTER — Other Ambulatory Visit: Payer: Self-pay | Admitting: Primary Care

## 2019-12-02 DIAGNOSIS — E119 Type 2 diabetes mellitus without complications: Secondary | ICD-10-CM

## 2019-12-13 IMAGING — CR DG CHEST 2V
1 series · 2 of 2 positions shown · non-contrast
Comparison: None.

CLINICAL DATA: Preop for right knee replacement.

EXAM:
CHEST - 2 VIEW

[Series 1: dg chest 2 view · 0.14mm/px · 2 of 2 slices shown]
[im 1/2]
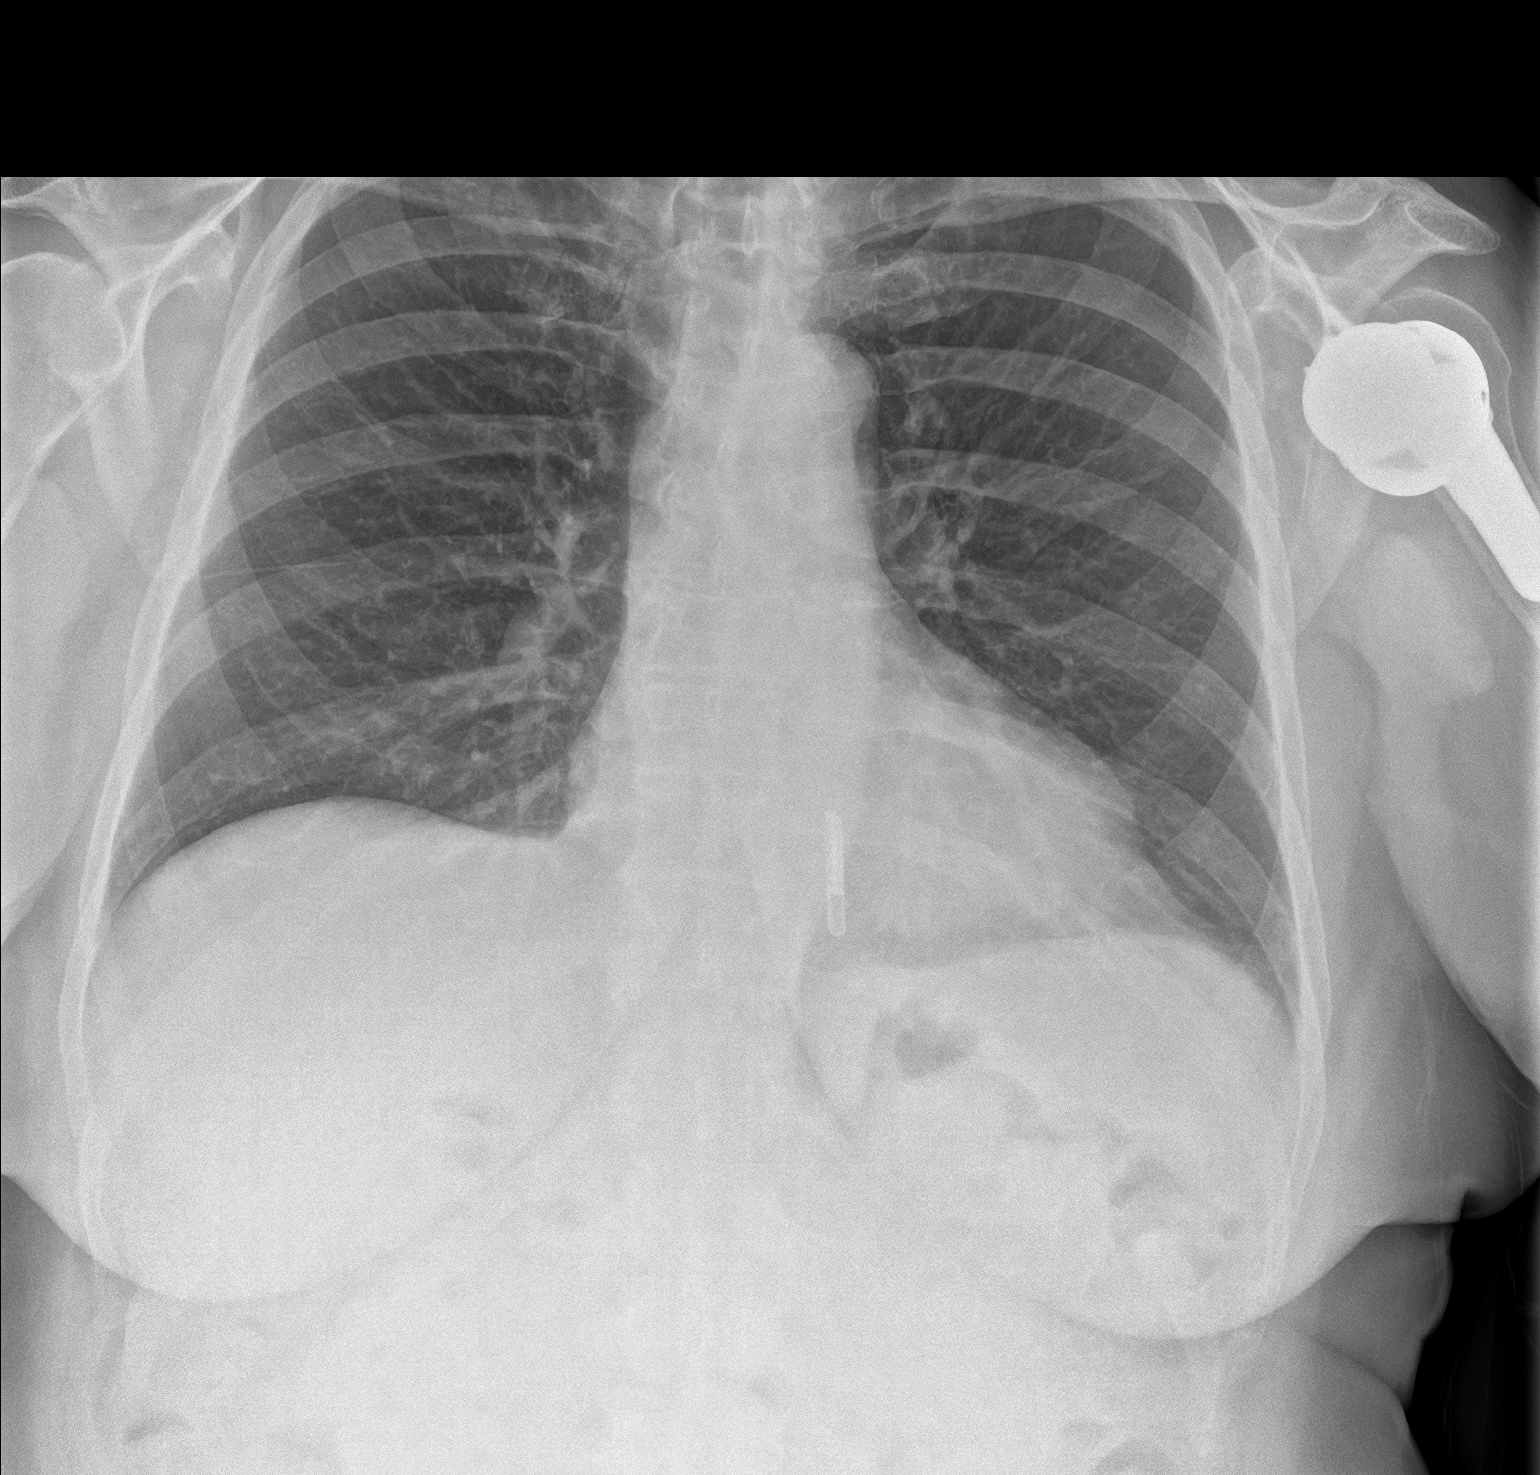
[im 2/2]
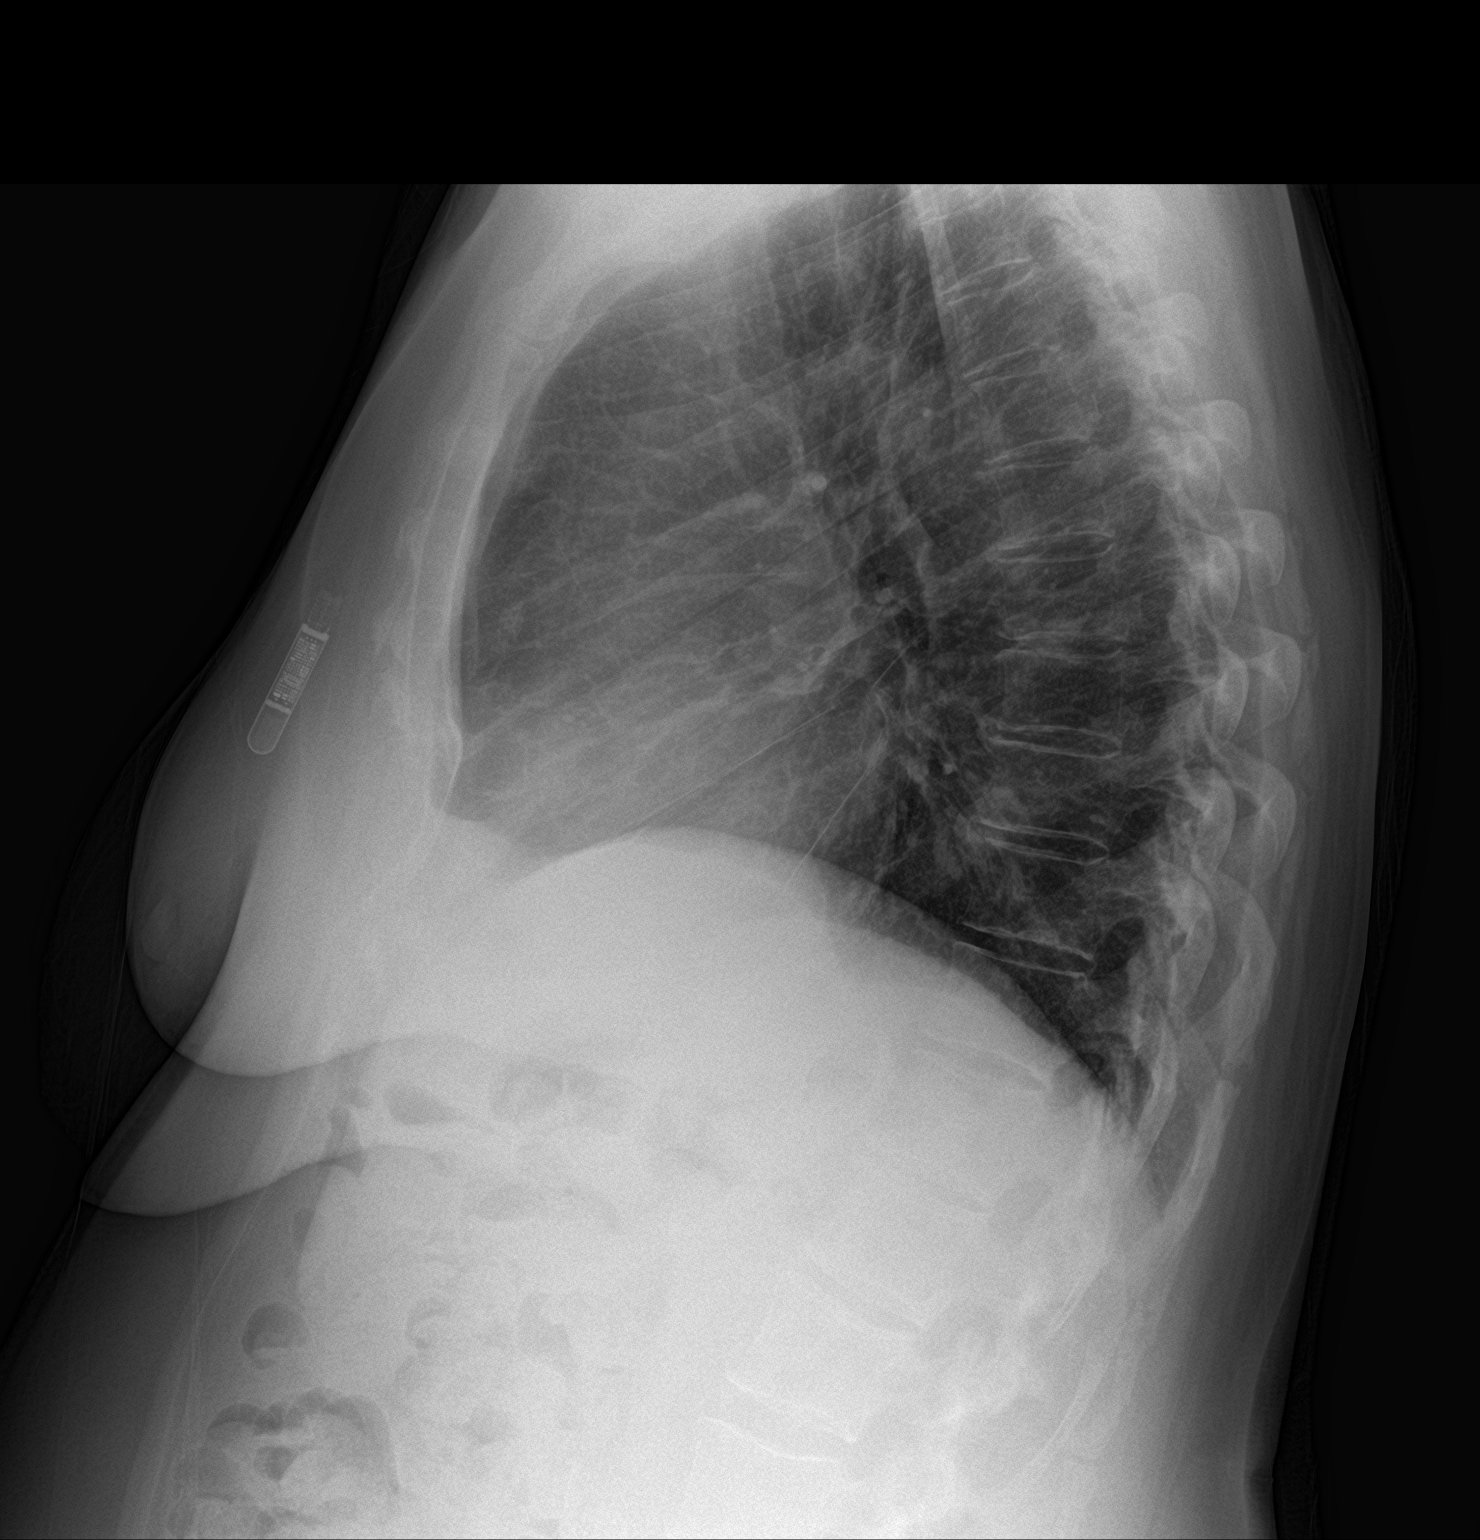

[2 of 2 positions shown; findings below may reference images not displayed]

FINDINGS: The heart size and mediastinal contours are within normal limits.
Both lungs are clear. The visualized skeletal structures are
unremarkable.
IMPRESSION: No active cardiopulmonary disease.

## 2020-01-06 ENCOUNTER — Other Ambulatory Visit: Payer: Self-pay | Admitting: Internal Medicine

## 2020-02-15 ENCOUNTER — Other Ambulatory Visit: Payer: Self-pay | Admitting: Primary Care

## 2020-02-15 DIAGNOSIS — E7849 Other hyperlipidemia: Secondary | ICD-10-CM

## 2020-02-18 ENCOUNTER — Other Ambulatory Visit: Payer: Self-pay | Admitting: Primary Care

## 2020-02-18 DIAGNOSIS — E7849 Other hyperlipidemia: Secondary | ICD-10-CM

## 2020-02-18 DIAGNOSIS — Z23 Encounter for immunization: Secondary | ICD-10-CM | POA: Diagnosis not present

## 2020-02-18 DIAGNOSIS — E119 Type 2 diabetes mellitus without complications: Secondary | ICD-10-CM

## 2020-02-26 ENCOUNTER — Ambulatory Visit: Payer: Medicare Other

## 2020-02-26 ENCOUNTER — Other Ambulatory Visit: Payer: Self-pay

## 2020-02-26 ENCOUNTER — Other Ambulatory Visit (INDEPENDENT_AMBULATORY_CARE_PROVIDER_SITE_OTHER): Payer: Medicare Other

## 2020-02-26 DIAGNOSIS — E119 Type 2 diabetes mellitus without complications: Secondary | ICD-10-CM

## 2020-02-26 DIAGNOSIS — E7849 Other hyperlipidemia: Secondary | ICD-10-CM

## 2020-02-26 LAB — LIPID PANEL
Cholesterol: 141 mg/dL (ref 0–200)
HDL: 44.9 mg/dL (ref >39.00)
LDL Cholesterol: 63 mg/dL (ref 0–99)
NonHDL: 96.26
Total CHOL/HDL Ratio: 3
Triglycerides: 167 mg/dL — ABNORMAL HIGH (ref 0.0–149.0)
VLDL: 33.4 mg/dL (ref 0.0–40.0)

## 2020-02-26 LAB — COMPREHENSIVE METABOLIC PANEL
ALT: 18 U/L (ref 0–35)
AST: 19 U/L (ref 0–37)
Albumin: 4.5 g/dL (ref 3.5–5.2)
Alkaline Phosphatase: 61 U/L (ref 39–117)
BUN: 21 mg/dL (ref 6–23)
CO2: 32 mEq/L (ref 19–32)
Calcium: 9.6 mg/dL (ref 8.4–10.5)
Chloride: 98 mEq/L (ref 96–112)
Creatinine, Ser: 0.8 mg/dL (ref 0.40–1.20)
GFR: 72.62 mL/min (ref >60.00)
Glucose, Bld: 119 mg/dL — ABNORMAL HIGH (ref 70–99)
Potassium: 4.6 mEq/L (ref 3.5–5.1)
Sodium: 137 mEq/L (ref 135–145)
Total Bilirubin: 0.5 mg/dL (ref 0.2–1.2)
Total Protein: 6.7 g/dL (ref 6.0–8.3)

## 2020-02-26 LAB — CBC
HCT: 43.2 % (ref 36.0–46.0)
Hemoglobin: 14.7 g/dL (ref 12.0–15.0)
MCHC: 33.9 g/dL (ref 30.0–36.0)
MCV: 91.6 fl (ref 78.0–100.0)
Platelets: 317 10*3/uL (ref 150.0–400.0)
RBC: 4.71 Mil/uL (ref 3.87–5.11)
RDW: 13 % (ref 11.5–15.5)
WBC: 6.8 10*3/uL (ref 4.0–10.5)

## 2020-02-26 LAB — MICROALBUMIN / CREATININE URINE RATIO
Creatinine,U: 124 mg/dL
Microalb Creat Ratio: 1.4 mg/g (ref 0.0–30.0)
Microalb, Ur: 1.7 mg/dL (ref 0.0–1.9)

## 2020-02-26 LAB — HEMOGLOBIN A1C: Hgb A1c MFr Bld: 6.6 % — ABNORMAL HIGH (ref 4.6–6.5)

## 2020-02-26 NOTE — Addendum Note (Signed)
Addended by: Ellamae Sia on: 02/26/2020 10:15 AM   Modules accepted: Orders

## 2020-03-04 ENCOUNTER — Other Ambulatory Visit: Payer: Self-pay

## 2020-03-04 ENCOUNTER — Ambulatory Visit (INDEPENDENT_AMBULATORY_CARE_PROVIDER_SITE_OTHER): Payer: Medicare Other | Admitting: Primary Care

## 2020-03-04 ENCOUNTER — Encounter: Payer: Self-pay | Admitting: Primary Care

## 2020-03-04 DIAGNOSIS — K219 Gastro-esophageal reflux disease without esophagitis: Secondary | ICD-10-CM

## 2020-03-04 DIAGNOSIS — E1141 Type 2 diabetes mellitus with diabetic mononeuropathy: Secondary | ICD-10-CM | POA: Diagnosis not present

## 2020-03-04 DIAGNOSIS — G4700 Insomnia, unspecified: Secondary | ICD-10-CM

## 2020-03-04 DIAGNOSIS — I480 Paroxysmal atrial fibrillation: Secondary | ICD-10-CM | POA: Diagnosis not present

## 2020-03-04 DIAGNOSIS — E7849 Other hyperlipidemia: Secondary | ICD-10-CM | POA: Diagnosis not present

## 2020-03-04 DIAGNOSIS — G2581 Restless legs syndrome: Secondary | ICD-10-CM

## 2020-03-04 DIAGNOSIS — E119 Type 2 diabetes mellitus without complications: Secondary | ICD-10-CM

## 2020-03-04 NOTE — Assessment & Plan Note (Signed)
Well-controlled on pravastatin 40 mg.  Continue same.

## 2020-03-04 NOTE — Progress Notes (Signed)
Subjective:    Patient ID: Kelsey Weaver, female    DOB: [birth year], 74 y.o.   MRN: [id number]  HPI  This visit occurred during the SARS-CoV-2 public health emergency.  Safety protocols were in place, including screening questions prior to the visit, additional usage of staff PPE, and extensive cleaning of exam room while observing appropriate contact time as indicated for disinfecting solutions.   Kelsey Weaver is a 74 year old female who presents today for follow up of chronic conditions and MWV Part 2. She will speak with our health advisor next month.   Immunizations: -Tetanus: Completed in 2012 -Influenza: Due -Shingles: Completed  -Pneumonia: Completed series  -Covid-19: Completed series  Mammogram: Completed in January 2021 Dexa: Completed in January 2021 Colonoscopy: Completed in 2018, due 2023 Hep C Screen: Negative  BP Readings from Last 3 Encounters:  03/04/20 124/72  10/22/19 118/64  08/30/19 120/82      Review of Systems  Constitutional: Negative for unexpected weight change.  HENT: Negative for rhinorrhea.   Eyes: Negative for visual disturbance.  Respiratory: Negative for cough and shortness of breath.   Cardiovascular: Negative for chest pain.  Gastrointestinal: Negative for constipation and diarrhea.  Genitourinary: Negative for difficulty urinating.  Musculoskeletal: Positive for arthralgias.  Skin: Negative for rash.  Allergic/Immunologic: Negative for environmental allergies.  Neurological: Negative for numbness and headaches.  Psychiatric/Behavioral: The patient is not nervous/anxious.        Past Medical History:  Diagnosis Date  . Cardiac arrhythmia due to congenital heart disease   . Diabetes mellitus without complication ([place])   . Diverticulosis   . DJD (degenerative joint disease)    frozen shoulder  . 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 GERD (gastroesophageal reflux disease)   . History of colonic polyps   .  Hyperlipemia   . Hypertension   . Overweight   . Paroxysmal atrial fibrillation ([place]) 09/2000  . 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 Marital status: Married    Spouse name: Not on file  . Number of children: 3  . Years of education: Not on file  . Highest education level: Not on file  Occupational History  . Occupation: Retired Therapist, sports  Tobacco Use  . Smoking status: Never Smoker  . Smokeless tobacco: Never Used  Vaping Use  . Vaping Use: Never used  Substance and Sexual Activity  . Alcohol use: No    Alcohol/week: 0.0 standard drinks  . Drug use: No  . Sexual activity: Not on file  Other Topics Concern  . 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    Once worked in L&D and NICU.    Son is a Marine scientist at [employer] and also works for [employer] .   Enjoys spending time with her family.    Social Determinants of Health   Financial Resource Strain:   . Difficulty of Paying Living Expenses: Not on file  Food Insecurity:   . Worried About Charity fundraiser in the Last Year: Not on file  . Ran Out of Food in the Last Year: Not on file  Transportation Needs:   . Lack of Transportation (Medical): Not on file  . Lack of Transportation (Non-Medical): Not on file  Physical  Activity:   . Days of Exercise per Week: Not on file  . Minutes of Exercise per Session: Not on file  Stress:   . Feeling of Stress : Not on file  Social Connections:   . Frequency of Communication with Friends and Family: Not on file  . Frequency of Social Gatherings with Friends and Family: Not on file  . Attends Religious Services: Not on file  . Active Member of Clubs or Organizations: Not on file  . Attends Archivist Meetings: Not on file  . Marital Status: Not on file  Intimate Partner  Violence:   . Fear of Current or Ex-Partner: Not on file  . Emotionally Abused: Not on file  . Physically Abused: Not on file  . Sexually Abused: Not on file    Past Surgical History:  Procedure Laterality Date  . ABLATION  10/31/2007, 08/26/2008, 09/22/2012, 07/01/2015   [place]ib ablation x 4 in [place]  . CATARACT EXTRACTION Bilateral   . COLONOSCOPY    . CYSTECTOMY     subsebacous x 2  . DILATION AND CURETTAGE OF UTERUS  at age 52  . EYE SURGERY    . FOOT SURGERY Bilateral 2008, 2014   4 hammertoes and bunionectomy  . implantable loop recorder pacement  01/19/13   MDT Reveal LINQ implanted in [place] for afib management  . JOINT REPLACEMENT Left 07/2017   shoulder  . KNEE SURGERY Left 2016   arthoscopy   . LIGATION / DIVISION SAPHENOUS VEIN Bilateral   . POLYPECTOMY    . REVERSE SHOULDER ARTHROPLASTY Left 08/02/2017   Procedure: TOTAL SHOULDER ARTHROPLASTY VS. REVERSE;  Surgeon: Corky Mull, MD;  Location: [facility] ORS;  Service: Orthopedics;  Laterality: Left;  . TOTAL KNEE ARTHROPLASTY Left 06/08/2018   Procedure: TOTAL KNEE ARTHROPLASTY;  Surgeon: Corky Mull, MD;  Location: [facility] ORS;  Service: Orthopedics;  Laterality: Left;    Family History  Problem Relation Age of Onset  . Hypertension Mother   . Glaucoma Mother   . CAD Father   . Atrial fibrillation Father   . CAD Brother   . Hypertension Brother   . Gallbladder disease Sister   . Atrial fibrillation Brother   . Hypercholesterolemia Brother   . Cholelithiasis Sister   . Colon cancer Neg Hx     Allergies  Allergen Reactions  . Ambien [Zolpidem Tartrate] Other (See Comments)    Flu like symptoms   . Compazine [Prochlorperazine Edisylate] Other (See Comments)    Aches and pains, generalized  . Metoprolol Other (See Comments)    Depression   . Reglan [Metoclopramide] Anxiety    Current Outpatient Medications on File Prior to Visit  Medication Sig Dispense Refill  . atenolol (TENORMIN) 25 MG tablet TAKE 1 TABLET (25 MG  TOTAL) BY MOUTH AS NEEDED. 90 tablet 2  . Cholecalciferol (VITAMIN D3) 125 MCG (5000 UT) CAPS Take 1 capsule by mouth daily.     . Cyanocobalamin (VITAMIN B-12 PO) Take 1 tablet by mouth daily.    . diclofenac sodium (VOLTAREN) 1 % GEL Apply 2 g topically 3 (three) times daily as needed (for knee pain).   1  . famotidine (PEPCID AC MAXIMUM STRENGTH) 20 MG tablet Take 20 mg by mouth at bedtime.     . flecainide (TAMBOCOR) 50 MG tablet TAKE 1 TABLET BY MOUTH TWICE A DAY AS NEEDED FOR AFIB 180 tablet 2  . gabapentin (NEURONTIN) 300 MG capsule Take 1 capsule (300 mg total) by mouth  at bedtime. For neuropathy and restless legs. 90 capsule 3  . ibuprofen (ADVIL,MOTRIN) 800 MG tablet Take 800 mg by mouth every 8 (eight) hours as needed for headache or moderate pain.     . metFORMIN (GLUCOPHAGE-XR) 500 MG 24 hr tablet TAKE 1 TABLET BY MOUTH EVERY DAY WITH BREAKFAST 90 tablet 1  . Multiple Vitamins-Minerals (PRESERVISION AREDS 2) CAPS Take 1 tablet by mouth daily.     . pravastatin (PRAVACHOL) 40 MG tablet TAKE 1 TABLET (40 MG TOTAL) BY MOUTH EVERY EVENING. FOR CHOLESTEROL. 90 tablet 3  . traZODone (DESYREL) 50 MG tablet TAKE 1-2 TABLETS (50-100 MG TOTAL) BY MOUTH AT BEDTIME AS NEEDED FOR SLEEP. 180 tablet 1  . triamterene-hydrochlorothiazide (MAXZIDE-25) 37.5-25 MG tablet TAKE 0.5 TABLETS BY MOUTH DAILY AS NEEDED (EDEMA). 45 tablet 3  . Turmeric 500 MG TABS Take 500 mg by mouth daily.     Current Facility-Administered Medications on File Prior to Visit  Medication Dose Route Frequency Provider Last Rate Last Admin  . 0.9 %  sodium chloride infusion  500 mL Intravenous Continuous Milus Banister, MD        BP 124/72   Pulse 71   Temp 97.6 F (36.4 C) (Temporal)   Ht 5\' 6"  (1.676 m)   Wt 208 lb (94.3 kg)   SpO2 98%   BMI 33.57 kg/m    Objective:   Physical Exam HENT:     Right Ear: Tympanic membrane and ear canal normal.     Left Ear: Tympanic membrane and ear canal normal.  Eyes:      Pupils: Pupils are equal, round, and reactive to light.  Cardiovascular:     Rate and Rhythm: Normal rate and regular rhythm.  Pulmonary:     Effort: Pulmonary effort is normal.     Breath sounds: Normal breath sounds.  Abdominal:     General: Bowel sounds are normal.     Palpations: Abdomen is soft.     Tenderness: There is no abdominal tenderness.  Musculoskeletal:        General: Normal range of motion.     Cervical back: Neck supple.  Skin:    General: Skin is warm and dry.  Neurological:     Mental Status: She is alert and oriented to person, place, and time.     Cranial Nerves: No cranial nerve deficit.     Deep Tendon Reflexes:     Reflex Scores:      Patellar reflexes are 2+ on the right side and 2+ on the left side. Psychiatric:        Mood and Affect: Mood normal.            Assessment & Plan:

## 2020-03-04 NOTE — Assessment & Plan Note (Signed)
Improved on gabapentin 300 mg at bedtime, but with breakthrough symptoms often.  Increased dose of gabapentin to 600 mg at bedtime, she will update.  Precaution also provided regarding gabapentin dose and trazodone together.

## 2020-03-04 NOTE — Assessment & Plan Note (Signed)
Rate and rhythm regular today. Following with cardiology. Continue atenolol 25 mg daily as needed. Continue flecainide 50 mg daily.

## 2020-03-04 NOTE — Patient Instructions (Signed)
Try increasing your gabapentin to 2 capsules at bedtime for restless and painful legs. Please update me in week or two.  Start exercising. You should be getting 150 minutes of moderate intensity exercise weekly.  It is important that you improve your diet. Please limit carbohydrates in the form of white bread, rice, pasta, sweets, fast food, fried food, sugary drinks, etc. Increase your consumption of fresh fruits and vegetables, whole grains, lean protein.  Ensure you are consuming 64 ounces of water daily.  Please schedule a follow up appointment in 6 months for diabetes check.   It was a pleasure to see you today!

## 2020-03-04 NOTE — Assessment & Plan Note (Signed)
Overall improved, but difficulty falling asleep due to lower extremity pain and restless legs.  We will work on increasing dose of gabapentin to 600 mg at bedtime.  Continue trazodone 50 mg at bedtime.  Caution provided given dose increase of gabapentin coupled with existence of trazodone.

## 2020-03-04 NOTE — Assessment & Plan Note (Signed)
Improved on 300 mg of gabapentin nightly, but continues to have symptoms.  Increase dose to 600 mg at bedtime, she will update.  Precaution provided given that she takes trazodone as well.

## 2020-03-04 NOTE — Assessment & Plan Note (Signed)
Recent A1c of 6.6 which is overall under good control, would like to see her lower as she has been lower in the past.  She admits to lack of activity over the summer but plans on increasing activity level soon.  Continue Metformin 500 mg once daily.  Follow-up in 6 months.

## 2020-03-04 NOTE — Assessment & Plan Note (Signed)
Overall doing well on Pepcid 20 mg OTC.  Continue same.  Discussed triggers.

## 2020-03-07 DIAGNOSIS — Z23 Encounter for immunization: Secondary | ICD-10-CM | POA: Diagnosis not present

## 2020-04-01 ENCOUNTER — Other Ambulatory Visit: Payer: Self-pay

## 2020-04-01 ENCOUNTER — Ambulatory Visit (INDEPENDENT_AMBULATORY_CARE_PROVIDER_SITE_OTHER): Payer: Medicare Other

## 2020-04-01 VITALS — Ht 66.0 in | Wt 208.0 lb

## 2020-04-01 DIAGNOSIS — Z0000 Encounter for general adult medical examination without abnormal findings: Secondary | ICD-10-CM | POA: Diagnosis not present

## 2020-04-01 NOTE — Progress Notes (Signed)
PCP notes:  Health Maintenance: No gaps noted    Abnormal Screenings: none   Patient concerns: none   Nurse concerns: none   Next PCP appt: none 

## 2020-04-01 NOTE — Patient Instructions (Signed)
Kelsey Weaver , Thank you for taking time to come for your Medicare Wellness Visit. I appreciate your ongoing commitment to your health goals. Please review the following plan we discussed and let me know if I can assist you in the future.   Screening recommendations/referrals: Colonoscopy: Up to date, completed 09/17/2016, due 09/2021 Mammogram: Up to date, completed 06/07/2019, due 05/2020 Bone Density: Up to date, completed 06/07/2019, due 2-5 years  Recommended yearly ophthalmology/optometry visit for glaucoma screening and checkup Recommended yearly dental visit for hygiene and checkup  Vaccinations: Influenza vaccine: Up to date, completed 02/18/2020, due 12/2020 Pneumococcal vaccine: Completed series Tdap vaccine: Up to date, completed 05/10/2010, due 05/2020 Shingles vaccine: Completed series   Covid-19:Completed series  Advanced directives: Advance directive discussed with you today. Even though you declined this today please call our office should you change your mind and we can give you the proper paperwork for you to fill out.  Conditions/risks identified: Diabetes  Next appointment: Follow up in one year for your annual wellness visit    Preventive Care 65 Years and Older, Female Preventive care refers to lifestyle choices and visits with your health care provider that can promote health and wellness. What does preventive care include?  A yearly physical exam. This is also called an annual well check.  Dental exams once or twice a year.  Routine eye exams. Ask your health care provider how often you should have your eyes checked.  Personal lifestyle choices, including:  Daily care of your teeth and gums.  Regular physical activity.  Eating a healthy diet.  Avoiding tobacco and drug use.  Limiting alcohol use.  Practicing safe sex.  Taking low-dose aspirin every day.  Taking vitamin and mineral supplements as recommended by your health care provider. What happens  during an annual well check? The services and screenings done by your health care provider during your annual well check will depend on your age, overall health, lifestyle risk factors, and family history of disease. Counseling  Your health care provider may ask you questions about your:  Alcohol use.  Tobacco use.  Drug use.  Emotional well-being.  Home and relationship well-being.  Sexual activity.  Eating habits.  History of falls.  Memory and ability to understand (cognition).  Work and work Statistician.  Reproductive health. Screening  You may have the following tests or measurements:  Height, weight, and BMI.  Blood pressure.  Lipid and cholesterol levels. These may be checked every 5 years, or more frequently if you are over 3 years old.  Skin check.  Lung cancer screening. You may have this screening every year starting at age 67 if you have a 30-pack-year history of smoking and currently smoke or have quit within the past 15 years.  Fecal occult blood test (FOBT) of the stool. You may have this test every year starting at age 52.  Flexible sigmoidoscopy or colonoscopy. You may have a sigmoidoscopy every 5 years or a colonoscopy every 10 years starting at age 49.  Hepatitis C blood test.  Hepatitis B blood test.  Sexually transmitted disease (STD) testing.  Diabetes screening. This is done by checking your blood sugar (glucose) after you have not eaten for a while (fasting). You may have this done every 1-3 years.  Bone density scan. This is done to screen for osteoporosis. You may have this done starting at age 49.  Mammogram. This may be done every 1-2 years. Talk to your health care provider about how often you  should have regular mammograms. Talk with your health care provider about your test results, treatment options, and if necessary, the need for more tests. Vaccines  Your health care provider may recommend certain vaccines, such  as:  Influenza vaccine. This is recommended every year.  Tetanus, diphtheria, and acellular pertussis (Tdap, Td) vaccine. You may need a Td booster every 10 years.  Zoster vaccine. You may need this after age 38.  Pneumococcal 13-valent conjugate (PCV13) vaccine. One dose is recommended after age 2.  Pneumococcal polysaccharide (PPSV23) vaccine. One dose is recommended after age 63. Talk to your health care provider about which screenings and vaccines you need and how often you need them. This information is not intended to replace advice given to you by your health care provider. Make sure you discuss any questions you have with your health care provider. Document Released: 05/23/2015 Document Revised: 01/14/2016 Document Reviewed: 02/25/2015 Elsevier Interactive Patient Education  2017 [employer] Prevention in the Home Falls can cause injuries. They can happen to people of all ages. There are many things you can do to make your home safe and to help prevent falls. What can I do on the outside of my home?  Regularly fix the edges of walkways and driveways and fix any cracks.  Remove anything that might make you trip as you walk through a door, such as a raised step or threshold.  Trim any bushes or trees on the path to your home.  Use bright outdoor lighting.  Clear any walking paths of anything that might make someone trip, such as rocks or tools.  Regularly check to see if handrails are loose or broken. Make sure that both sides of any steps have handrails.  Any raised decks and porches should have guardrails on the edges.  Have any leaves, snow, or ice cleared regularly.  Use sand or salt on walking paths during winter.  Clean up any spills in your garage right away. This includes oil or grease spills. What can I do in the bathroom?  Use night lights.  Install grab bars by the toilet and in the tub and shower. Do not use towel bars as grab bars.  Use  non-skid mats or decals in the tub or shower.  If you need to sit down in the shower, use a plastic, non-slip stool.  Keep the floor dry. Clean up any water that spills on the floor as soon as it happens.  Remove soap buildup in the tub or shower regularly.  Attach bath mats securely with double-sided non-slip rug tape.  Do not have throw rugs and other things on the floor that can make you trip. What can I do in the bedroom?  Use night lights.  Make sure that you have a light by your bed that is easy to reach.  Do not use any sheets or blankets that are too big for your bed. They should not hang down onto the floor.  Have a firm chair that has side arms. You can use this for support while you get dressed.  Do not have throw rugs and other things on the floor that can make you trip. What can I do in the kitchen?  Clean up any spills right away.  Avoid walking on wet floors.  Keep items that you use a lot in easy-to-reach places.  If you need to reach something above you, use a strong step stool that has a grab bar.  Keep electrical cords out  of the way.  Do not use floor polish or wax that makes floors slippery. If you must use wax, use non-skid floor wax.  Do not have throw rugs and other things on the floor that can make you trip. What can I do with my stairs?  Do not leave any items on the stairs.  Make sure that there are handrails on both sides of the stairs and use them. Fix handrails that are broken or loose. Make sure that handrails are as long as the stairways.  Check any carpeting to make sure that it is firmly attached to the stairs. Fix any carpet that is loose or worn.  Avoid having throw rugs at the top or bottom of the stairs. If you do have throw rugs, attach them to the floor with carpet tape.  Make sure that you have a light switch at the top of the stairs and the bottom of the stairs. If you do not have them, ask someone to add them for you. What  else can I do to help prevent falls?  Wear shoes that:  Do not have high heels.  Have rubber bottoms.  Are comfortable and fit you well.  Are closed at the toe. Do not wear sandals.  If you use a stepladder:  Make sure that it is fully opened. Do not climb a closed stepladder.  Make sure that both sides of the stepladder are locked into place.  Ask someone to hold it for you, if possible.  Clearly mark and make sure that you can see:  Any grab bars or handrails.  First and last steps.  Where the edge of each step is.  Use tools that help you move around (mobility aids) if they are needed. These include:  Canes.  Walkers.  Scooters.  Crutches.  Turn on the lights when you go into a dark area. Replace any light bulbs as soon as they burn out.  Set up your furniture so you have a clear path. Avoid moving your furniture around.  If any of your floors are uneven, fix them.  If there are any pets around you, be aware of where they are.  Review your medicines with your doctor. Some medicines can make you feel dizzy. This can increase your chance of falling. Ask your doctor what other things that you can do to help prevent falls. This information is not intended to replace advice given to you by your health care provider. Make sure you discuss any questions you have with your health care provider. Document Released: 02/20/2009 Document Revised: 10/02/2015 Document Reviewed: 05/31/2014 Elsevier Interactive Patient Education  2017 [employer].

## 2020-04-01 NOTE — Progress Notes (Signed)
Subjective:   Kelsey Weaver is a 74 y.o. female who presents for Medicare Annual (Subsequent) preventive examination.  Review of Systems: N/A      I connected with the patient today by telephone and verified that I am speaking with the correct person using two identifiers. Location patient: home Location nurse: work Persons participating in the telephone visit: patient, nurse.   I discussed the limitations, risks, security and privacy concerns of performing an evaluation and management service by telephone and the availability of in person appointments. I also discussed with the patient that there may be a patient responsible charge related to this service. The patient expressed understanding and verbally consented to this telephonic visit.        Cardiac Risk Factors include: advanced age (>5men, >9 women);diabetes mellitus     Objective:    Today's Vitals   04/01/20 0813  Weight: 208 lb (94.3 kg)  Height: 5\' 6"  (1.676 m)   Body mass index is 33.57 kg/m.  Advanced Directives 04/01/2020 03/04/2020 02/21/2019 06/08/2018 05/24/2018 02/15/2018 08/02/2017  Does Patient Have a Medical Advance Directive? No No No No No No No  Does patient want to make changes to medical advance directive? - - - - - - No - Patient declined  Would patient like information on creating a medical advance directive? No - Patient declined - No - Patient declined No - Patient declined Yes (MAU/Ambulatory/Procedural Areas - Information given) Yes (MAU/Ambulatory/Procedural Areas - Information given) -    Current Medications (verified) Outpatient Encounter Medications as of 04/01/2020  Medication Sig  . atenolol (TENORMIN) 25 MG tablet TAKE 1 TABLET (25 MG TOTAL) BY MOUTH AS NEEDED.  Marland Kitchen Cholecalciferol (VITAMIN D3) 125 MCG (5000 UT) CAPS Take 1 capsule by mouth daily.   . Cyanocobalamin (VITAMIN B-12 PO) Take 1 tablet by mouth daily.  . diclofenac sodium (VOLTAREN) 1 % GEL Apply 2 g topically 3 (three) times  daily as needed (for knee pain).   . famotidine (PEPCID AC MAXIMUM STRENGTH) 20 MG tablet Take 20 mg by mouth at bedtime.   . flecainide (TAMBOCOR) 50 MG tablet TAKE 1 TABLET BY MOUTH TWICE A DAY AS NEEDED FOR AFIB  . gabapentin (NEURONTIN) 300 MG capsule Take 1 capsule (300 mg total) by mouth at bedtime. For neuropathy and restless legs.  Marland Kitchen ibuprofen (ADVIL,MOTRIN) 800 MG tablet Take 800 mg by mouth every 8 (eight) hours as needed for headache or moderate pain.   . metFORMIN (GLUCOPHAGE-XR) 500 MG 24 hr tablet TAKE 1 TABLET BY MOUTH EVERY DAY WITH BREAKFAST  . Multiple Vitamins-Minerals (PRESERVISION AREDS 2) CAPS Take 1 tablet by mouth daily.   . pravastatin (PRAVACHOL) 40 MG tablet TAKE 1 TABLET (40 MG TOTAL) BY MOUTH EVERY EVENING. FOR CHOLESTEROL.  Marland Kitchen traZODone (DESYREL) 50 MG tablet TAKE 1-2 TABLETS (50-100 MG TOTAL) BY MOUTH AT BEDTIME AS NEEDED FOR SLEEP.  Marland Kitchen triamterene-hydrochlorothiazide (MAXZIDE-25) 37.5-25 MG tablet TAKE 0.5 TABLETS BY MOUTH DAILY AS NEEDED (EDEMA).  . Turmeric 500 MG TABS Take 500 mg by mouth daily.   Facility-Administered Encounter Medications as of 04/01/2020  Medication  . 0.9 %  sodium chloride infusion    Allergies (verified) Ambien [zolpidem tartrate], Compazine [prochlorperazine edisylate], Metoprolol, and Reglan [metoclopramide]   History: Past Medical History:  Diagnosis Date  . Cardiac arrhythmia due to congenital heart disease   . Diabetes mellitus without complication ([place])   . Diverticulosis   . DJD (degenerative joint disease)    frozen shoulder  . 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 GERD (gastroesophageal reflux disease)   . History of colonic polyps   . Hyperlipemia   . Hypertension   . Overweight   . Paroxysmal atrial fibrillation ([place]) 09/2000  . 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 ABLATION  10/31/2007, 08/26/2008, 09/22/2012, 07/01/2015   AFib ablation x 4 in [place]  . CATARACT EXTRACTION Bilateral   . COLONOSCOPY    . CYSTECTOMY     subsebacous x 2  . DILATION AND CURETTAGE OF UTERUS  at age 51  . EYE SURGERY    . FOOT SURGERY Bilateral 2008, 2014   4 hammertoes and bunionectomy  . implantable loop recorder pacement  01/19/13   MDT Reveal LINQ implanted in [place] for afib management  . JOINT REPLACEMENT Left 07/2017   shoulder  . KNEE SURGERY Left 2016   arthoscopy   . LIGATION / DIVISION SAPHENOUS VEIN Bilateral   . POLYPECTOMY    . REVERSE SHOULDER ARTHROPLASTY Left 08/02/2017   Procedure: TOTAL SHOULDER ARTHROPLASTY VS. REVERSE;  Surgeon: Corky Mull, MD;  Location: [facility] ORS;  Service: Orthopedics;  Laterality: Left;  . TOTAL KNEE ARTHROPLASTY Left 06/08/2018   Procedure: TOTAL KNEE ARTHROPLASTY;  Surgeon: Corky Mull, MD;  Location: [facility] ORS;  Service: Orthopedics;  Laterality: Left;   Family History  Problem Relation Age of Onset  . Hypertension Mother   . Glaucoma Mother   . CAD Father   . Atrial fibrillation Father   . CAD Brother   . Hypertension Brother   . Gallbladder disease Sister   . Atrial fibrillation Brother   . Hypercholesterolemia Brother   . Cholelithiasis Sister   . Colon cancer Neg Hx    Social History   Socioeconomic History  . Marital status: Married    Spouse name: Not on file  . Number of children: 3  . Years of education: Not on file  . Highest education level: Not on file  Occupational History  . Occupation: Retired Therapist, sports  Tobacco Use  . Smoking status: Never Smoker  . Smokeless tobacco: Never Used  Vaping Use  . Vaping Use: Never used  Substance and Sexual Activity  . Alcohol use: Yes    Alcohol/week: 0.0 standard drinks    Comment: socially  . Drug use: No  . Sexual activity: Not on file  Other Topics Concern  . 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    Once worked in L&D and NICU.    Son is a Marine scientist at [employer] and also works for [employer] .   Enjoys spending time with her family.    Social Determinants of Health   Financial Resource Strain: Low Risk   . Difficulty of Paying Living Expenses: Not hard at all  Food Insecurity: No Food Insecurity  . Worried About Charity fundraiser in the Last Year: Never true  . Ran Out of Food in the Last Year: Never true  Transportation Needs: No Transportation Needs  . Lack of Transportation (Medical): No  . Lack of Transportation (Non-Medical): No  Physical Activity: Insufficiently Active  . Days of Exercise per Week: 2 days  . Minutes of Exercise per Session: 60 min  Stress: No Stress Concern Present  .  Feeling of Stress : Not at all  Social Connections:   . Frequency of Communication with Friends and Family: Not on file  . Frequency of Social Gatherings with Friends and Family: Not on file  . Attends Religious Services: Not on file  . Active Member of Clubs or Organizations: Not on file  . Attends Archivist Meetings: Not on file  . Marital Status: Not on file    Tobacco Counseling Counseling given: Not Answered   Clinical Intake:  Pre-visit preparation completed: Yes  Pain : No/denies pain     Nutritional Risks: None Diabetes: Yes CBG done?: No Did pt. bring in CBG monitor from home?: No  How often do you need to have someone help you when you read instructions, pamphlets, or other written materials from your doctor or pharmacy?: 1 - Never What is the last grade level you completed in school?: 4 years of college  Diabetic: Yes Nutrition Risk Assessment:  Has the patient had any N/V/D within the last 2 months?  No  Does the patient have any non-healing wounds?  No  Has the patient had any unintentional weight loss or weight gain?  No   Diabetes:  Is the patient diabetic?  Yes  If diabetic, was a CBG obtained today?  No  Did the  patient bring in their glucometer from home?  No, telephone visit How often do you monitor your CBG's? Only at doctor visits.   Financial Strains and Diabetes Management:  Are you having any financial strains with the device, your supplies or your medication? No .  Does the patient want to be seen by Chronic Care Management for management of their diabetes?  No  Would the patient like to be referred to a Nutritionist or for Diabetic Management?  No   Diabetic Exams:  Diabetic Eye Exam: Completed 04/10/2019 Diabetic Foot Exam: Completed 03/04/2020   Interpreter Needed?: No  Information entered by :: CJohnson, LPN   Activities of Daily Living In your present state of health, do you have any difficulty performing the following activities: 04/01/2020 03/04/2020  Hearing? N N  Vision? N N  Difficulty concentrating or making decisions? N N  Walking or climbing stairs? N N  Dressing or bathing? N N  Doing errands, shopping? N N  Preparing Food and eating ? N -  Using the Toilet? N -  In the past six months, have you accidently leaked urine? N -  Do you have problems with loss of bowel control? N -  Managing your Medications? N -  Managing your Finances? N -  Housekeeping or managing your Housekeeping? N -  Some recent data might be hidden    Patient Care Team: Pleas Koch, NP as PCP - General (Internal Medicine) Berenice Primas, MD as Referring Physician (Orthopedic Surgery)  Indicate any recent Medical Services you may have received from other than [facility] providers in the past year (date may be approximate).     Assessment:   This is a routine wellness examination for [facility].  Hearing/Vision screen  Hearing Screening   125Hz  250Hz  500Hz  1000Hz  2000Hz  3000Hz  4000Hz  6000Hz  8000Hz   Right ear:           Left ear:           Vision Screening Comments: Patient gets annual eye exams  Dietary issues and exercise activities discussed: Current Exercise Habits: Home  exercise routine, Type of exercise: walking, Time (Minutes): 60, Frequency (Times/Week): 2, Weekly Exercise (Minutes/Week): 120, Intensity:  Moderate, Exercise limited by: None identified  Goals    . Increase physical activity     Starting 02/15/2018, I will continue to walk 2-3 miles daily.     . Patient Stated     02/21/2019, I will start increasing my exercise regimen daily.     . Patient Stated     04/01/2020, I will continue to walk 1 mile 2 days a week.       Depression Screen PHQ 2/9 Scores 04/01/2020 03/04/2020 02/21/2019 02/15/2018 12/03/2016  PHQ - 2 Score 0 0 0 0 0  PHQ- 9 Score 0 0 0 0 -    Fall Risk Fall Risk  04/01/2020 03/04/2020 02/21/2019 03/27/2018 02/15/2018  Falls in the past year? 0 0 0 1 No  Comment - - - Emmi Telephone Survey: data to providers prior to load -  Number falls in past yr: 0 0 - 1 -  Comment - - - Emmi Telephone Survey Actual Response = 1 -  Injury with Fall? 0 0 - 0 -  Risk for fall due to : Medication side effect - Medication side effect - -  Follow up Falls evaluation completed;Falls prevention discussed - Falls evaluation completed;Falls prevention discussed - -    Any stairs in or around the home? Yes  If so, are there any without handrails? No  Home free of loose throw rugs in walkways, pet beds, electrical cords, etc? Yes  Adequate lighting in your home to reduce risk of falls? Yes   ASSISTIVE DEVICES UTILIZED TO PREVENT FALLS:  Life alert? No  Use of a cane, walker or w/c? No  Grab bars in the bathroom? No  Shower chair or bench in shower? No  Elevated toilet seat or a handicapped toilet? No   TIMED UP AND GO:  Was the test performed? N/A, telephone visit.   Cognitive Function: MMSE - Mini Mental State Exam 04/01/2020 02/21/2019 02/15/2018 12/03/2016  Orientation to time 5 5 5 5   Orientation to Place 5 5 5 5   Registration 3 3 3 3   Attention/ Calculation 5 5 0 0  Recall 3 3 3 3   Language- name 2 objects - - 0 0  Language-  repeat 1 1 1 1   Language- follow 3 step command - - 3 3  Language- read & follow direction - - 0 0  Write a sentence - - 0 0  Copy design - - 0 0  Total score - - 20 20  Mini Cog  Mini-Cog screen was completed. Maximum score is 22. A value of 0 denotes this part of the MMSE was not completed or the patient failed this part of the Mini-Cog screening.       Immunizations Immunization History  Administered Date(s) Administered  . Fluad Quad(high Dose 65+) 02/18/2020  . Influenza, High Dose Seasonal PF 02/17/2016, 02/25/2017, 02/15/2019  . Influenza,inj,Quad PF,6+ Mos 02/15/2018  . Moderna SARS-COVID-2 Vaccination 06/09/2019, 07/07/2019, 03/07/2020  . Pneumococcal Conjugate-13 05/10/2014  . Pneumococcal Polysaccharide-23 02/13/2015  . Tdap 05/10/2010  . Zoster 06/10/2014  . Zoster Recombinat (Shingrix) 05/15/2018, 07/10/2018    TDAP status: Up to date Flu Vaccine status: Up to date Pneumococcal vaccine status: Up to date Covid-19 vaccine status: Completed vaccines  Qualifies for Shingles Vaccine? Yes   Zostavax completed Yes   Shingrix Completed?: Yes  Screening Tests Health Maintenance  Topic Date Due  . OPHTHALMOLOGY EXAM  04/09/2020  . TETANUS/TDAP  05/10/2020  . HEMOGLOBIN A1C  08/26/2020  . URINE MICROALBUMIN  02/25/2021  . FOOT EXAM  03/04/2021  . MAMMOGRAM  06/06/2021  . COLONOSCOPY  09/17/2021  . INFLUENZA VACCINE  Completed  . DEXA SCAN  Completed  . COVID-19 Vaccine  Completed  . Hepatitis C Screening  Completed  . PNA vac Low Risk Adult  Completed    Health Maintenance  There are no preventive care reminders to display for this patient.  Colorectal cancer screening: Completed 09/17/2016. Repeat every 5 years Mammogram status: Completed 06/07/2019. Repeat every year Bone Density status: Completed 06/07/2019. Results reflect: Bone density results: NORMAL. Repeat every 2-5 years.  Lung Cancer Screening: (Low Dose CT Chest recommended if Age 55-80 years,  30 pack-year currently smoking OR have quit w/in 15 years.) does not qualify.   Additional Screening:  Hepatitis C Screening: does qualify; Completed 03/04/2016  Vision Screening: Recommended annual ophthalmology exams for early detection of glaucoma and other disorders of the eye. Is the patient up to date with their annual eye exam?  Yes  Who is the provider or what is the name of the office in which the patient attends annual eye exams? Dr. Kerin Ransom, [facility] If pt is not established with a provider, would they like to be referred to a provider to establish care? No .   Dental Screening: Recommended annual dental exams for proper oral hygiene  Community Resource Referral / Chronic Care Management: CRR required this visit?  No   CCM required this visit?  No      Plan:     I have personally reviewed and noted the following in the patient's chart:   . Medical and social history . Use of alcohol, tobacco or illicit drugs  . Current medications and supplements . Functional ability and status . Nutritional status . Physical activity . Advanced directives . List of other physicians . Hospitalizations, surgeries, and ER visits in previous 12 months . Vitals . Screenings to include cognitive, depression, and falls . Referrals and appointments  In addition, I have reviewed and discussed with patient certain preventive protocols, quality metrics, and best practice recommendations. A written personalized care plan for preventive services as well as general preventive health recommendations were provided to patient.   Due to this being a telephonic visit, the after visit summary with patients personalized plan was offered to patient via office or my-chart. Patient preferred to pick up at office at next visit or via mychart.   Andrez Grime, LPN   [id number]

## 2020-04-14 ENCOUNTER — Other Ambulatory Visit: Payer: Self-pay | Admitting: Primary Care

## 2020-04-21 ENCOUNTER — Other Ambulatory Visit: Payer: Self-pay

## 2020-04-21 DIAGNOSIS — E1141 Type 2 diabetes mellitus with diabetic mononeuropathy: Secondary | ICD-10-CM

## 2020-04-21 DIAGNOSIS — G2581 Restless legs syndrome: Secondary | ICD-10-CM

## 2020-04-21 MED ORDER — GABAPENTIN 300 MG PO CAPS: 600.0000 mg | ORAL_CAPSULE | Freq: Every day | ORAL | 3 refills | Status: DC

## 2020-05-01 ENCOUNTER — Other Ambulatory Visit: Payer: Self-pay | Admitting: Primary Care

## 2020-05-01 DIAGNOSIS — Z1231 Encounter for screening mammogram for malignant neoplasm of breast: Secondary | ICD-10-CM

## 2020-06-03 DIAGNOSIS — L570 Actinic keratosis: Secondary | ICD-10-CM | POA: Diagnosis not present

## 2020-06-04 ENCOUNTER — Other Ambulatory Visit: Payer: Self-pay | Admitting: Primary Care

## 2020-06-04 DIAGNOSIS — E119 Type 2 diabetes mellitus without complications: Secondary | ICD-10-CM

## 2020-06-04 NOTE — Telephone Encounter (Signed)
Pharmacy requests refill on: Metformin 500 mg 24 hr   LAST REFILL: 12/03/2019 (Q-90, R-1) LAST OV: 03/04/2020 NEXT OV: Not Scheduled  PHARMACY: [id number] [place], [place]  Hgb A1C (02/26/2020): 6.6

## 2020-06-09 ENCOUNTER — Other Ambulatory Visit: Payer: Self-pay

## 2020-06-09 ENCOUNTER — Ambulatory Visit
Admission: RE | Admit: 2020-06-09 | Discharge: 2020-06-09 | Disposition: A | Discharge status: Home / Self Care | Payer: Medicare Other | Source: Ambulatory Visit | Attending: Primary Care | Admitting: Primary Care

## 2020-06-09 DIAGNOSIS — Z1231 Encounter for screening mammogram for malignant neoplasm of breast: Secondary | ICD-10-CM | POA: Diagnosis not present

## 2020-06-25 DIAGNOSIS — L570 Actinic keratosis: Secondary | ICD-10-CM | POA: Diagnosis not present

## 2020-08-04 DIAGNOSIS — H10021 Other mucopurulent conjunctivitis, right eye: Secondary | ICD-10-CM | POA: Diagnosis not present

## 2020-08-07 DIAGNOSIS — H11421 Conjunctival edema, right eye: Secondary | ICD-10-CM | POA: Diagnosis not present

## 2020-08-07 DIAGNOSIS — H00031 Abscess of right upper eyelid: Secondary | ICD-10-CM | POA: Diagnosis not present

## 2020-08-07 DIAGNOSIS — H11431 Conjunctival hyperemia, right eye: Secondary | ICD-10-CM | POA: Diagnosis not present

## 2020-08-13 DIAGNOSIS — H00031 Abscess of right upper eyelid: Secondary | ICD-10-CM | POA: Diagnosis not present

## 2020-08-13 DIAGNOSIS — H11431 Conjunctival hyperemia, right eye: Secondary | ICD-10-CM | POA: Diagnosis not present

## 2020-08-15 DIAGNOSIS — Z23 Encounter for immunization: Secondary | ICD-10-CM | POA: Diagnosis not present

## 2020-08-19 ENCOUNTER — Other Ambulatory Visit: Payer: Self-pay | Admitting: Internal Medicine

## 2020-10-10 ENCOUNTER — Other Ambulatory Visit: Payer: Self-pay | Admitting: Primary Care

## 2020-10-10 DIAGNOSIS — G4700 Insomnia, unspecified: Secondary | ICD-10-CM

## 2020-10-27 ENCOUNTER — Ambulatory Visit: Payer: Medicare Other | Admitting: Internal Medicine

## 2020-10-27 DIAGNOSIS — I480 Paroxysmal atrial fibrillation: Secondary | ICD-10-CM

## 2020-10-27 DIAGNOSIS — I10 Essential (primary) hypertension: Secondary | ICD-10-CM

## 2020-10-27 DIAGNOSIS — E7800 Pure hypercholesterolemia, unspecified: Secondary | ICD-10-CM

## 2020-11-19 ENCOUNTER — Other Ambulatory Visit: Payer: Self-pay | Admitting: Internal Medicine

## 2020-11-23 ENCOUNTER — Other Ambulatory Visit: Payer: Self-pay | Admitting: Primary Care

## 2020-11-23 DIAGNOSIS — E119 Type 2 diabetes mellitus without complications: Secondary | ICD-10-CM

## 2020-11-27 DIAGNOSIS — L570 Actinic keratosis: Secondary | ICD-10-CM | POA: Diagnosis not present

## 2021-01-02 ENCOUNTER — Ambulatory Visit: Payer: Medicare Other | Admitting: Internal Medicine

## 2021-01-05 ENCOUNTER — Other Ambulatory Visit: Payer: Self-pay | Admitting: Internal Medicine

## 2021-02-14 ENCOUNTER — Other Ambulatory Visit: Payer: Self-pay | Admitting: Internal Medicine

## 2021-02-14 ENCOUNTER — Other Ambulatory Visit: Payer: Self-pay | Admitting: Primary Care

## 2021-02-14 DIAGNOSIS — E7849 Other hyperlipidemia: Secondary | ICD-10-CM

## 2021-02-18 ENCOUNTER — Telehealth: Payer: Self-pay | Admitting: Primary Care

## 2021-02-18 NOTE — Telephone Encounter (Signed)
LVM for pt to rtn my call to r/s appt with NHA on 04/03/21

## 2021-02-20 ENCOUNTER — Telehealth: Payer: Self-pay

## 2021-02-20 ENCOUNTER — Telehealth (INDEPENDENT_AMBULATORY_CARE_PROVIDER_SITE_OTHER): Payer: Medicare Other | Admitting: Primary Care

## 2021-02-20 ENCOUNTER — Encounter: Payer: Self-pay | Admitting: Primary Care

## 2021-02-20 VITALS — Temp 97.8°F | Ht 66.0 in | Wt 208.0 lb

## 2021-02-20 DIAGNOSIS — U071 COVID-19: Secondary | ICD-10-CM | POA: Diagnosis not present

## 2021-02-20 HISTORY — DX: COVID-19: U07.1

## 2021-02-20 MED ORDER — MOLNUPIRAVIR EUA 200MG CAPSULE: 4.0000 | ORAL_CAPSULE | Freq: Two times a day (BID) | ORAL | 0 refills | Status: AC

## 2021-02-20 NOTE — Telephone Encounter (Signed)
Noted.  Patient evaluated.

## 2021-02-20 NOTE — Telephone Encounter (Signed)
Pt already has video visit with Gentry Fitz NP 02/20/21 at 11:20 AM. Pt tested + covid at home test on 02/19/21 and symptoms started on 02/18/21; pt has fever, H/A, head congestion and prod cough with white phlegm. UC & ED precautions given and pt voiced understanding. Also wished pt a happy birthday and a speedy recovery. Sending note to Gentry Fitz NP, [facility] CMA and will also teams Joellen.

## 2021-02-20 NOTE — Progress Notes (Signed)
Patient ID: Kelsey Weaver, female    DOB: [birth year], 75 y.o.   MRN: [id number]  Virtual visit completed through [place], a video enabled telemedicine application. Due to national recommendations of social distancing due to COVID-19, a virtual visit is felt to be most appropriate for this patient at this time. Reviewed limitations, risks, security and privacy concerns of performing a virtual visit and the availability of in person appointments. I also reviewed that there may be a patient responsible charge related to this service. The patient agreed to proceed.   Patient location: home Provider location: [place] [facility], office Persons participating in this virtual visit: patient, provider   If any vitals were documented, they were collected by patient at home unless specified below.    Temp 97.8 F (36.6 C) (Temporal)   Ht 5\' 6"  (1.676 m)   Wt 208 lb (94.3 kg)   BMI 33.57 kg/m    CC: Positive Covid Subjective:   HPI: Kelsey Weaver is a 75 y.o. female with a history of atrial fibrillation, type 2 diabetes, GERD, hyperlipidemia presenting on 02/20/2021 for to discuss Covid-19 infection.  She was at the beach this week, started feeling "Crummy" Wednesday afternoon with body aches, fatigue, sore throat. She came home from the beach yesterday as she began to feel worse. Temperature last night was 101.0, no fever today.  She tested positive for Covid-19 last night, has the test result with her today. Today she has some dizziness, decreased appetite, fatigue. She's taken Tylenol and Ibuprofen. She is interested in antiviral treatment.         Relevant past medical, surgical, family and social history reviewed and updated as indicated. Interim medical history since our last visit reviewed. Allergies and medications reviewed and updated. Outpatient Medications Prior to Visit  Medication Sig Dispense Refill   atenolol (TENORMIN) 25 MG tablet TAKE 1 TABLET (25 MG TOTAL) BY  MOUTH AS NEEDED. 90 tablet 0   Cholecalciferol (VITAMIN D3) 125 MCG (5000 UT) CAPS Take 1 capsule by mouth daily.      Cyanocobalamin (VITAMIN B-12 PO) Take 1 tablet by mouth daily.     diclofenac sodium (VOLTAREN) 1 % GEL Apply 2 g topically 3 (three) times daily as needed (for knee pain).   1   famotidine (PEPCID) 20 MG tablet Take 20 mg by mouth at bedtime.      flecainide (TAMBOCOR) 50 MG tablet TAKE 1 TABLET BY MOUTH TWICE A DAY AS NEEDED FOR AFIB 180 tablet 0   ibuprofen (ADVIL,MOTRIN) 800 MG tablet Take 800 mg by mouth every 8 (eight) hours as needed for headache or moderate pain.      metFORMIN (GLUCOPHAGE-XR) 500 MG 24 hr tablet TAKE 1 TABLET BY MOUTH EVERY DAY WITH BREAKFAST 90 tablet 0   Multiple Vitamins-Minerals (PRESERVISION AREDS 2) CAPS Take 1 tablet by mouth daily.      pravastatin (PRAVACHOL) 40 MG tablet Take 1 tablet (40 mg total) by mouth every evening. For cholesterol. Office visit required for further refills. 90 tablet 0   traZODone (DESYREL) 50 MG tablet TAKE 1-2 TABLETS (50-100 MG TOTAL) BY MOUTH AT BEDTIME AS NEEDED FOR SLEEP. 180 tablet 1   triamterene-hydrochlorothiazide (MAXZIDE-25) 37.5-25 MG tablet TAKE 0.5 TABLETS BY MOUTH DAILY AS NEEDED (EDEMA). 45 tablet 3   Turmeric 500 MG TABS Take 500 mg by mouth daily.     gabapentin (NEURONTIN) 300 MG capsule Take 2 capsules (600 mg total) by mouth at bedtime. For neuropathy and restless  legs. (Patient not taking: Reported on 02/20/2021) 180 capsule 3   Facility-Administered Medications Prior to Visit  Medication Dose Route Frequency Provider Last Rate Last Admin   0.9 %  sodium chloride infusion  500 mL Intravenous Continuous Milus Banister, MD         Per HPI unless specifically indicated in ROS section below Review of Systems  Constitutional:  Positive for chills, fatigue and fever.  HENT:  Positive for congestion.   Respiratory:  Positive for cough.   Musculoskeletal:  Positive for arthralgias.  Objective:   Temp 97.8 F (36.6 C) (Temporal)   Ht 5\' 6"  (1.676 m)   Wt 208 lb (94.3 kg)   BMI 33.57 kg/m   Wt Readings from Last 3 Encounters:  02/20/21 208 lb (94.3 kg)  04/01/20 208 lb (94.3 kg)  03/04/20 208 lb (94.3 kg)       Physical exam: Gen: alert, NAD, appears tired. Pulm: speaks in complete sentences without increased work of breathing. No cough during visit Psych: normal mood, normal thought content      Results for orders placed or performed in visit on 02/26/20  CBC  Result Value Ref Range   WBC 6.8 4.0 - 10.5 K/uL   RBC 4.71 3.87 - 5.11 Mil/uL   Platelets 317.0 150.0 - 400.0 K/uL   Hemoglobin 14.7 12.0 - 15.0 g/dL   HCT 43.2 36.0 - 46.0 %   MCV 91.6 78.0 - 100.0 fl   MCHC 33.9 30.0 - 36.0 g/dL   RDW 13.0 11.5 - 15.5 %  Comprehensive metabolic panel  Result Value Ref Range   Sodium 137 135 - 145 mEq/L   Potassium 4.6 3.5 - 5.1 mEq/L   Chloride 98 96 - 112 mEq/L   CO2 32 19 - 32 mEq/L   Glucose, Bld 119 (H) 70 - 99 mg/dL   BUN 21 6 - 23 mg/dL   Creatinine, Ser 0.80 0.40 - 1.20 mg/dL   Total Bilirubin 0.5 0.2 - 1.2 mg/dL   Alkaline Phosphatase 61 39 - 117 U/L   AST 19 0 - 37 U/L   ALT 18 0 - 35 U/L   Total Protein 6.7 6.0 - 8.3 g/dL   Albumin 4.5 3.5 - 5.2 g/dL   GFR 72.62 >60.00 mL/min   Calcium 9.6 8.4 - 10.5 mg/dL  Hemoglobin A1c  Result Value Ref Range   Hgb A1c MFr Bld 6.6 (H) 4.6 - 6.5 %  Lipid panel  Result Value Ref Range   Cholesterol 141 0 - 200 mg/dL   Triglycerides 167.0 (H) 0.0 - 149.0 mg/dL   HDL 44.90 >39.00 mg/dL   VLDL 33.4 0.0 - 40.0 mg/dL   LDL Cholesterol 63 0 - 99 mg/dL   Total CHOL/HDL Ratio 3    NonHDL 96.26   Microalbumin / creatinine urine ratio  Result Value Ref Range   Microalb, Ur 1.7 0.0 - 1.9 mg/dL   Creatinine,U 124.0 mg/dL   Microalb Creat Ratio 1.4 0.0 - 30.0 mg/g   Assessment & Plan:   Problem List Items Addressed This Visit   None    No orders of the defined types were placed in this encounter.  No orders of  the defined types were placed in this encounter.   I discussed the assessment and treatment plan with the patient. The patient was provided an opportunity to ask questions and all were answered. The patient agreed with the plan and demonstrated an understanding of the instructions. The patient was advised  to call back or seek an in-person evaluation if the symptoms worsen or if the condition fails to improve as anticipated.  Follow up plan:  Start the molnupiravir 200 mg capsules. Take 4 capsules by mouth twice daily for five days.   Okay to take Ibuprofen and Tylenol. You can use Delsym if needed.   It was a pleasure to see you today!   Pleas Koch, NP

## 2021-02-20 NOTE — Patient Instructions (Signed)
Start the molnupiravir 200 mg capsules. Take 4 capsules by mouth twice daily for five days.   Okay to take Ibuprofen and Tylenol. You can use Delsym if needed.   It was a pleasure to see you today!

## 2021-02-20 NOTE — Assessment & Plan Note (Signed)
Confirmed positive home test. She is within the window of treatment and qualifies for treatment.   No recent renal function panel on file.  Rx for molnupiravir 200 mg sent to pharmacy. Discussed other conservative treatment.  Return precautions provided.

## 2021-02-20 NOTE — Telephone Encounter (Signed)
[place] Night - Client TELEPHONE ADVICE RECORD [facility] Patient Name: Kelsey Weaver [facility] Gender: Female DOB: 1945-12-02 Age: 74 Y Return Phone Number: [phone] (Primary), [phone] (Secondary) Address: City/ State/ Zip: [place] [address] Client [facility] Night - Client Client Site [place] Physician Alma Friendly - NP Contact Type Call Who Is Calling Patient / Member / Family / Caregiver Call Type Triage / Clinical Relationship To Patient Self Return Phone Number [phone] (Primary) Chief Complaint Headache Reason for Call Symptomatic / Request for [place] states she has COVID. States she has a headache and cough. Translation No Nurse Assessment Nurse: Raphael Gibney, RN, Vera Date/Time (Eastern Time): 02/20/2021 8:02:19 AM Confirm and document reason for call. If symptomatic, describe symptoms. ---Caller states she is positive for COVID. has a headache, stuffy nose. Temp 101.1 last night and 100 this am. Has a cough. Symptoms started Wednesday night. oxygen level 97%. Does the patient have any new or worsening symptoms? ---Yes Will a triage be completed? ---Yes Related visit to physician within the last 2 weeks? ---No Does the PT have any chronic conditions? (i.e. diabetes, asthma, this includes High risk factors for pregnancy, etc.) ---Yes List chronic conditions. ---diabetes Is this a behavioral health or substance abuse call? ---No Guidelines Guideline Title Affirmed Question Affirmed Notes Nurse Date/Time (Eastern Time) COVID-19 - Diagnosed or Suspected [1] Fever > 101 F (38.3 C) AND [2] age > 70 years Raphael Gibney, Therapist, sports, Vanita Ingles 02/20/2021 8:06:41 AM Disp. Time Eilene Ghazi Time) Disposition Final User 02/20/2021 8:03:26 AM Attempt made - message left Raphael Gibney, RN, Vanita Ingles 02/20/2021 8:15:02 AM See HCP within 4 Hours (or PCP triage) Yes  Raphael Gibney, RN, Vanita Ingles PLEASE NOTE: All timestamps contained within this report are represented as [place] Standard Time. CONFIDENTIALTY NOTICE: This fax transmission is intended only for the addressee. It contains information that is legally privileged, confidential or otherwise protected from use or disclosure. If you are not the intended recipient, you are strictly prohibited from reviewing, disclosing, copying using or disseminating any of this information or taking any action in reliance on or regarding this information. If you have received this fax in error, please notify us immediately by telephone so that we can arrange for its return to [place]. Phone: [phone], Toll-Free: [phone], Fax: [phone] Page: 2 of 2 Call Id: [id number] Spray Disagree/Comply Disagree Caller Understands Yes PreDisposition Home Care Care Advice Given Per Guideline SEE HCP (OR PCP TRIAGE) WITHIN 4 HOURS: * IF OFFICE WILL BE OPEN: You need to be seen within the next 3 or 4 hours. Call your doctor (or NP/PA) now or as soon as the office opens. COVID-19 - HOW TO PROTECT OTHERS - WHEN YOU ARE SICK WITH COVID-19: * STAY HOME A MINIMUM OF 5 DAYS: People with MILD COVID-19 can STOP HOME ISOLATION AFTER 5 DAYS if (1) fever has been gone for 24 hours (without using fever medicine) AND (2) symptoms are better. Continue to wear a well-fitted mask for a full 10 days when around others. GENERAL CARE ADVICE FOR COVID-19 SYMPTOMS: * The symptoms are generally treated the same whether you have COVID-19, influenza or some other respiratory virus. * Cough: Use cough drops. * Feeling dehydrated: Drink extra liquids. If the air in your home is dry, use a humidifier. * Fever: For fever over 101 F (38.3 C), take acetaminophen every 4 to 6 hours (Adults 650 mg) OR ibuprofen every 6 to 8 hours (Adults 400 mg). Before  taking any medicine, read all the instructions on the package. Do not take aspirin unless your doctor has prescribed  it for you. CALL BACK IF: * You become worse * WEAR A MASK FOR 10 DAYS: Wear a well-fitted mask for 10 full days any time you are around others inside your home or in public. Do not go to places where you are unable to wear a mask. CARE ADVICE given per COVID-19 - DIAGNOSED OR SUSPECTED (Adult) guideline. Comments User: Dannielle Burn, RN Date/Time Eilene Ghazi Time): 02/20/2021 8:03:14 AM Called primary number and left message. will try secondary number. User: Dannielle Burn, RN Date/Time Eilene Ghazi Time): 02/20/2021 8:13:58 AM pt does not want to go to the urgent care. she wants to know if the antiviral can be ordered. User: Dannielle Burn, RN Date/Time Eilene Ghazi Time): 02/20/2021 8:23:50 AM Called office and spoke to [place] and gave report that pt is positive for COVID and has fever. Triage outcome see physician within 4 hrs. States there are appts available and she will call pt back. Referrals GO TO FACILITY REFUSE

## 2021-02-21 ENCOUNTER — Other Ambulatory Visit: Payer: Self-pay | Admitting: Family Medicine

## 2021-02-21 DIAGNOSIS — E119 Type 2 diabetes mellitus without complications: Secondary | ICD-10-CM

## 2021-02-27 ENCOUNTER — Ambulatory Visit: Payer: Medicare Other | Admitting: Internal Medicine

## 2021-02-27 DIAGNOSIS — E7800 Pure hypercholesterolemia, unspecified: Secondary | ICD-10-CM

## 2021-02-27 DIAGNOSIS — I10 Essential (primary) hypertension: Secondary | ICD-10-CM

## 2021-02-27 DIAGNOSIS — I480 Paroxysmal atrial fibrillation: Secondary | ICD-10-CM

## 2021-03-05 DIAGNOSIS — Z23 Encounter for immunization: Secondary | ICD-10-CM | POA: Diagnosis not present

## 2021-03-20 ENCOUNTER — Other Ambulatory Visit: Payer: Self-pay | Admitting: Primary Care

## 2021-03-20 DIAGNOSIS — E119 Type 2 diabetes mellitus without complications: Secondary | ICD-10-CM

## 2021-04-03 ENCOUNTER — Ambulatory Visit: Payer: Medicare Other

## 2021-04-03 ENCOUNTER — Other Ambulatory Visit: Payer: Self-pay | Admitting: Primary Care

## 2021-04-03 DIAGNOSIS — G4700 Insomnia, unspecified: Secondary | ICD-10-CM

## 2021-04-07 ENCOUNTER — Ambulatory Visit: Payer: Medicare Other

## 2021-04-07 ENCOUNTER — Encounter: Payer: Medicare Other | Admitting: Primary Care

## 2021-04-12 ENCOUNTER — Other Ambulatory Visit: Payer: Self-pay | Admitting: Primary Care

## 2021-04-12 DIAGNOSIS — E119 Type 2 diabetes mellitus without complications: Secondary | ICD-10-CM

## 2021-04-13 DIAGNOSIS — Z961 Presence of intraocular lens: Secondary | ICD-10-CM | POA: Diagnosis not present

## 2021-04-13 DIAGNOSIS — E119 Type 2 diabetes mellitus without complications: Secondary | ICD-10-CM | POA: Diagnosis not present

## 2021-04-13 DIAGNOSIS — E089 Diabetes mellitus due to underlying condition without complications: Secondary | ICD-10-CM | POA: Diagnosis not present

## 2021-04-13 DIAGNOSIS — H524 Presbyopia: Secondary | ICD-10-CM | POA: Diagnosis not present

## 2021-04-13 LAB — HM DIABETES EYE EXAM

## 2021-05-08 ENCOUNTER — Encounter: Payer: Self-pay | Admitting: Internal Medicine

## 2021-05-08 ENCOUNTER — Ambulatory Visit (INDEPENDENT_AMBULATORY_CARE_PROVIDER_SITE_OTHER): Payer: Medicare Other | Admitting: Internal Medicine

## 2021-05-08 ENCOUNTER — Encounter: Payer: Self-pay | Admitting: *Deleted

## 2021-05-08 ENCOUNTER — Other Ambulatory Visit: Payer: Self-pay

## 2021-05-08 VITALS — BP 138/78 | HR 82 | Ht 66.0 in | Wt 223.0 lb

## 2021-05-08 DIAGNOSIS — I10 Essential (primary) hypertension: Secondary | ICD-10-CM | POA: Diagnosis not present

## 2021-05-08 DIAGNOSIS — I480 Paroxysmal atrial fibrillation: Secondary | ICD-10-CM

## 2021-05-08 DIAGNOSIS — E7800 Pure hypercholesterolemia, unspecified: Secondary | ICD-10-CM

## 2021-05-08 NOTE — Patient Instructions (Addendum)
Medication Instructions:  Your physician recommends that you continue on your current medications as directed. Please refer to the Current Medication list given to you today. *If you need a refill on your cardiac medications before your next appointment, please call your pharmacy*  Lab Work: None. If you have labs (blood work) drawn today and your tests are completely normal, you will receive your results only by: [place] (if you have MyChart) OR A paper copy in the mail If you have any lab test that is abnormal or we need to change your treatment, we will call you to review the results.  Testing/Procedures: Your physician has requested that you have an echocardiogram. Echocardiography is a painless test that uses sound waves to create images of your heart. It provides your doctor with information about the size and shape of your heart and how well your hearts chambers and valves are working. This procedure takes approximately one hour. There are no restrictions for this procedure.  Your physician has requested that you have cardiac CT. Cardiac computed tomography (CT) is a painless test that uses an x-ray machine to take clear, detailed pictures of your heart. For further information please visit [email or web address]. Please follow instruction sheet as given.    Follow-Up: At [facility], you and your health needs are our priority.  As part of our continuing mission to provide you with exceptional heart care, we have created designated Provider Care Teams.  These Care Teams include your primary Cardiologist (physician) and Advanced Practice Providers (APPs -  Physician Assistants and Nurse Practitioners) who all work together to provide you with the care you need, when you need it.  Your physician wants you to follow-up in: 12 months with   one of the following Advanced Practice Providers on your designated Care Team:    Tommye Standard, PA-C    You will receive a reminder letter in  the mail two months in advance. If you don't receive a letter, please call our office to schedule the follow-up appointment.  We recommend signing up for the patient portal called "MyChart".  Sign up information is provided on this After Visit Summary.  MyChart is used to connect with patients for Virtual Visits (Telemedicine).  Patients are able to view lab/test results, encounter notes, upcoming appointments, etc.  Non-urgent messages can be sent to your provider as well.   To learn more about what you can do with MyChart, go to [email or web address].    Any Other Special Instructions Will Be Listed Below (If Applicable).

## 2021-05-08 NOTE — Progress Notes (Signed)
PCP: Pleas Koch, NP   Primary EP: Dr Theone Stanley Kelsey Weaver is a 75 y.o. female who presents today for routine electrophysiology followup.  Since last being seen in our clinic, the patient reports doing reasonably well.  She had COVID several months ago.  She hasnt felt well since.  + exertional SOB and fatigue.  Today, she denies symptoms of palpitations, chest pain,   lower extremity edema, dizziness, presyncope, or syncope.  The patient is otherwise without complaint today.   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   Past Surgical History:  Procedure Laterality Date   ABLATION  10/31/2007, 08/26/2008, 09/22/2012, 07/01/2015   AFib ablation x 4 in [place]   CATARACT EXTRACTION Bilateral    COLONOSCOPY     CYSTECTOMY     subsebacous x 2   DILATION AND CURETTAGE OF UTERUS  at age 52   EYE SURGERY     FOOT SURGERY Bilateral 2008, 2014   4 hammertoes and bunionectomy   implantable loop recorder pacement  01/19/13   MDT Reveal LINQ implanted in [place] for afib management   JOINT REPLACEMENT Left 07/2017   shoulder   KNEE SURGERY Left 2016   arthoscopy    LIGATION / DIVISION SAPHENOUS VEIN Bilateral    POLYPECTOMY     REVERSE SHOULDER ARTHROPLASTY Left 08/02/2017   Procedure: TOTAL SHOULDER ARTHROPLASTY VS. REVERSE;  Surgeon: Corky Mull, MD;  Location: [facility] ORS;  Service: Orthopedics;  Laterality: Left;   TOTAL KNEE ARTHROPLASTY Left 06/08/2018   Procedure: TOTAL KNEE  ARTHROPLASTY;  Surgeon: Corky Mull, MD;  Location: [facility] ORS;  Service: Orthopedics;  Laterality: Left;    ROS- all systems are reviewed and negatives except as per HPI above  Current Outpatient Medications  Medication Sig Dispense Refill   atenolol (TENORMIN) 25 MG tablet TAKE 1 TABLET (25 MG TOTAL) BY MOUTH AS NEEDED. 90 tablet 0   Cholecalciferol (VITAMIN D3) 125 MCG (5000 UT) CAPS Take 1 capsule by mouth daily.      Cyanocobalamin (VITAMIN B-12 PO) Take 1 tablet by mouth daily.     diclofenac sodium (VOLTAREN) 1 % GEL Apply 2 g topically 3 (three) times daily as needed (for knee pain).   1   famotidine (PEPCID) 20 MG tablet Take 20 mg by mouth at bedtime.      flecainide (TAMBOCOR) 50 MG tablet TAKE 1 TABLET BY MOUTH TWICE A DAY AS NEEDED FOR AFIB 180 tablet 0   ibuprofen (ADVIL,MOTRIN) 800 MG tablet Take 800 mg by mouth every 8 (eight) hours as needed for headache or moderate pain.      metFORMIN (GLUCOPHAGE-XR) 500 MG 24 hr tablet TAKE 1 TABLET BY MOUTH DAILY WITH BREAKFAST FOR DIABETES. OFFICE VISIT REQUIRED FOR FURTHER REFILLS 30 tablet 0   Multiple Vitamins-Minerals (PRESERVISION AREDS 2) CAPS Take 1 tablet by mouth daily.      pravastatin (PRAVACHOL) 40 MG tablet  Take 1 tablet (40 mg total) by mouth every evening. For cholesterol. Office visit required for further refills. 90 tablet 0   traZODone (DESYREL) 50 MG tablet TAKE 1-2 TABLETS BY MOUTH AT BEDTIME AS NEEDED FOR SLEEP. Office visit required for further refills. 180 tablet 0   triamterene-hydrochlorothiazide (MAXZIDE-25) 37.5-25 MG tablet TAKE 0.5 TABLETS BY MOUTH DAILY AS NEEDED (EDEMA). 45 tablet 3   Turmeric 500 MG TABS Take 500 mg by mouth daily.     Current Facility-Administered Medications  Medication Dose Route Frequency Provider Last Rate Last Admin   0.9 %  sodium chloride infusion  500 mL Intravenous Continuous Milus Banister, MD        Physical Exam: Vitals:   05/08/21 1519  BP: 138/78  Pulse: 82  SpO2:  96%  Weight: 223 lb (101.2 kg)  Height: 5\' 6"  (1.676 m)    GEN- The patient is well appearing, alert and oriented x 3 today.   Head- normocephalic, atraumatic Eyes-  Sclera clear, conjunctiva pink Ears- hearing intact Oropharynx- clear Lungs- Clear to ausculation bilaterally, normal work of breathing Heart- Regular rate and rhythm, no murmurs, rubs or gallops, PMI not laterally displaced GI- soft, NT, ND, + BS Extremities- no clubbing, cyanosis, or edema  Wt Readings from Last 3 Encounters:  05/08/21 223 lb (101.2 kg)  02/20/21 208 lb (94.3 kg)  04/01/20 208 lb (94.3 kg)    EKG tracing ordered today is personally reviewed and shows sinus  Assessment and Plan:  Paroxysmal atrial fibrillation She has not had afib in a number of years.  She wishes to stay on flecainide and atenolol  Chads2vasc score is 4.  She has declined [place] but will reconsider if her AF burden increases.  2. HTN Stable No change required today  3. SOB post covid I will obtain an echo to evaluate for cardiomyopathy post covid Cardiac CT with FFR to evaluate for CAD as a cause for her exertional SOB. Follow-up with PCP for PFTs and further evaluation is also advised.   Risks, benefits and potential toxicities for medications prescribed and/or refilled reviewed with patient today.   Return to see EP APP in a year I have also offered follow-up with Dr Quentin Ore in [place] in a year as an option.  Thompson Grayer MD, [facility] 05/08/2021 3:22 PM

## 2021-05-12 ENCOUNTER — Other Ambulatory Visit: Payer: Self-pay | Admitting: Primary Care

## 2021-05-12 DIAGNOSIS — Z1231 Encounter for screening mammogram for malignant neoplasm of breast: Secondary | ICD-10-CM

## 2021-05-12 NOTE — Progress Notes (Signed)
Subjective:   Kelsey Weaver is a 76 y.o. female who presents for Medicare Annual (Subsequent) preventive examination.  I connected with Teresa Nicodemus today by telephone and verified that I am speaking with the correct person using two identifiers. Location patient: home Location provider: work Persons participating in the virtual visit: patient, Marine scientist.    I discussed the limitations, risks, security and privacy concerns of performing an evaluation and management service by telephone and the availability of in person appointments. I also discussed with the patient that there may be a patient responsible charge related to this service. The patient expressed understanding and verbally consented to this telephonic visit.    Interactive audio and video telecommunications were attempted between this provider and patient, however failed, due to patient having technical difficulties OR patient did not have access to video capability.  We continued and completed visit with audio only.  Some vital signs may be absent or patient reported.   Time Spent with patient on telephone encounter: 25 minutes  Review of Systems     Cardiac Risk Factors include: advanced age (>7men, >86 women);diabetes mellitus;dyslipidemia     Objective:    Today's Vitals   05/14/21 1116  Weight: 220 lb (99.8 kg)  Height: 5\' 6"  (1.676 m)   Body mass index is 35.51 kg/m.  Advanced Directives 05/14/2021 04/01/2020 03/04/2020 02/21/2019 06/08/2018 05/24/2018 02/15/2018  Does Patient Have a Medical Advance Directive? No No No No No No No  Does patient want to make changes to medical advance directive? Yes (MAU/Ambulatory/Procedural Areas - Information given) - - - - - -  Would patient like information on creating a medical advance directive? - No - Patient declined - No - Patient declined No - Patient declined Yes (MAU/Ambulatory/Procedural Areas - Information given) Yes (MAU/Ambulatory/Procedural Areas - Information given)     Current Medications (verified) Outpatient Encounter Medications as of 05/14/2021  Medication Sig   acetaminophen (TYLENOL) 650 MG CR tablet Take 650 mg by mouth every 8 (eight) hours as needed for pain. Patient taking 2 times per day   atenolol (TENORMIN) 25 MG tablet TAKE 1 TABLET (25 MG TOTAL) BY MOUTH AS NEEDED.   Cholecalciferol (VITAMIN D3) 125 MCG (5000 UT) CAPS Take 1 capsule by mouth daily.    Cyanocobalamin (VITAMIN B-12 PO) Take 1 tablet by mouth daily.   diclofenac sodium (VOLTAREN) 1 % GEL Apply 2 g topically 3 (three) times daily as needed (for knee pain).    famotidine (PEPCID) 20 MG tablet Take 20 mg by mouth at bedtime.    flecainide (TAMBOCOR) 50 MG tablet TAKE 1 TABLET BY MOUTH TWICE A DAY AS NEEDED FOR AFIB   ibuprofen (ADVIL,MOTRIN) 800 MG tablet Take 800 mg by mouth every 8 (eight) hours as needed for headache or moderate pain.    metFORMIN (GLUCOPHAGE-XR) 500 MG 24 hr tablet TAKE 1 TABLET BY MOUTH DAILY WITH BREAKFAST FOR DIABETES. OFFICE VISIT REQUIRED FOR FURTHER REFILLS   Multiple Vitamins-Minerals (PRESERVISION AREDS 2) CAPS Take 1 tablet by mouth daily.    pravastatin (PRAVACHOL) 40 MG tablet TAKE 1 TABLET (40 MG TOTAL) BY MOUTH EVERY EVENING. FOR CHOLESTEROL. OFFICE VISIT REQUIRED FOR FURTHER REFILLS.   traZODone (DESYREL) 50 MG tablet TAKE 1-2 TABLETS BY MOUTH AT BEDTIME AS NEEDED FOR SLEEP. Office visit required for further refills.   triamterene-hydrochlorothiazide (MAXZIDE-25) 37.5-25 MG tablet TAKE 0.5 TABLETS BY MOUTH DAILY AS NEEDED (EDEMA).   Turmeric 500 MG TABS Take 500 mg by mouth daily.   [DISCONTINUED]  pravastatin (PRAVACHOL) 40 MG tablet Take 1 tablet (40 mg total) by mouth every evening. For cholesterol. Office visit required for further refills.   Facility-Administered Encounter Medications as of 05/14/2021  Medication   0.9 %  sodium chloride infusion    Allergies (verified) Ambien [zolpidem tartrate], Compazine [prochlorperazine edisylate],  Metoprolol, and Reglan [metoclopramide]   History: Past Medical History:  Diagnosis Date   Cardiac arrhythmia due to congenital heart disease    Diabetes mellitus without complication (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   Past Surgical History:  Procedure Laterality Date   ABLATION  10/31/2007, 08/26/2008, 09/22/2012, 07/01/2015   AFib ablation x 4 in [place]   CATARACT EXTRACTION Bilateral    COLONOSCOPY     CYSTECTOMY     subsebacous x 2   DILATION AND CURETTAGE OF UTERUS  at age 77   EYE SURGERY     FOOT SURGERY Bilateral 2008, 2014   4 hammertoes and bunionectomy   implantable loop recorder pacement  01/19/13   MDT Reveal LINQ implanted in [place] for afib management   JOINT REPLACEMENT Left 07/2017   shoulder   KNEE SURGERY Left 2016   arthoscopy    LIGATION / DIVISION SAPHENOUS VEIN Bilateral    POLYPECTOMY     REVERSE SHOULDER ARTHROPLASTY Left 08/02/2017   Procedure: TOTAL SHOULDER ARTHROPLASTY VS. REVERSE;  Surgeon: Corky Mull, MD;  Location: [facility] ORS;  Service: Orthopedics;  Laterality: Left;   TOTAL KNEE ARTHROPLASTY Left 06/08/2018   Procedure: TOTAL KNEE ARTHROPLASTY;  Surgeon: Corky Mull, MD;  Location: [facility] ORS;  Service: Orthopedics;  Laterality: Left;   Family History  Problem Relation Age of Onset   Hypertension Mother    Glaucoma Mother    CAD Father    Atrial fibrillation Father    CAD Brother    Hypertension Brother    Gallbladder disease Sister    Atrial fibrillation Brother    Hypercholesterolemia Brother    Cholelithiasis Sister    Colon cancer Neg Hx    Social  History   Socioeconomic History   Marital status: Married    Spouse name: Not on file   Number of children: 3   Years of education: Not on file   Highest education level: Not on file  Occupational History   Occupation: Retired Therapist, sports  Tobacco Use   Smoking status: Never   Smokeless tobacco: Never  Vaping Use   Vaping Use: Never used  Substance and Sexual Activity   Alcohol use: Yes    Alcohol/week: 0.0 standard drinks    Comment: socially   Drug use: No   Sexual activity: Not on file  Other Topics Concern   Not on file  Social History Narrative   Pt recently moved to [place]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    Once worked in L&D and NICU.    Son is a Marine scientist at [employer] and also works for [employer] .   Enjoys spending time with her family.    Social Determinants of Health  Financial Resource Strain: Low Risk    Difficulty of Paying Living Expenses: Not hard at all  Food Insecurity: No Food Insecurity   Worried About Charity fundraiser in the Last Year: Never true   Ran Out of Food in the Last Year: Never true  Transportation Needs: No Transportation Needs   Lack of Transportation (Medical): No   Lack of Transportation (Non-Medical): No  Physical Activity: Insufficiently Active   Days of Exercise per Week: 1 day   Minutes of Exercise per Session: 120 min  Stress: No Stress Concern Present   Feeling of Stress : Not at all  Social Connections: Socially Integrated   Frequency of Communication with Friends and Family: More than three times a week   Frequency of Social Gatherings with Friends and Family: Twice a week   Attends Religious Services: More than 4 times per year   Active Member of [employer] or Organizations: Yes   Attends Music therapist: More than 4 times per year   Marital Status: Married    Tobacco Counseling Counseling given: Not Answered   Clinical Intake:  Pre-visit preparation completed: Yes  Pain : No/denies pain     BMI -  recorded: 35.51 Nutritional Status: BMI > 30  Obese Nutritional Risks: None Diabetes: Yes CBG done?: No Did pt. bring in CBG monitor from home?: No  How often do you need to have someone help you when you read instructions, pamphlets, or other written materials from your doctor or pharmacy?: 1 - Never  Diabetes:  Is the patient diabetic?  Yes  If diabetic, was a CBG obtained today?  No  Did the patient bring in their glucometer from home?  No  How often do you monitor your CBG's? Patient not currently monitoring .   Financial Strains and Diabetes Management:  Are you having any financial strains with the device, your supplies or your medication? No .  Does the patient want to be seen by Chronic Care Management for management of their diabetes?  No  Would the patient like to be referred to a Nutritionist or for Diabetic Management?  No   Diabetic Exams:  Diabetic Eye Exam: Completed 04/2021.   Diabetic Foot Exam:  Patient has an upcoming appointment with PCP.   Interpreter Needed?: No  Information entered by :: Orrin Brigham LPN   Activities of Daily Living In your present state of health, do you have any difficulty performing the following activities: 05/14/2021  Hearing? Y  Comment decrease in hearing  Vision? N  Difficulty concentrating or making decisions? N  Walking or climbing stairs? N  Dressing or bathing? N  Doing errands, shopping? N  Preparing Food and eating ? N  Using the Toilet? N  In the past six months, have you accidently leaked urine? Y  Do you have problems with loss of bowel control? N  Managing your Medications? N  Managing your Finances? N  Housekeeping or managing your Housekeeping? N  Some recent data might be hidden    Patient Care Team: Pleas Koch, NP as PCP - General (Internal Medicine) Berenice Primas, MD as Referring Physician (Orthopedic Surgery)  Indicate any recent Medical Services you may have received from other than [facility]  providers in the past year (date may be approximate).     Assessment:   This is a routine wellness examination for [facility].  Hearing/Vision screen Hearing Screening - Comments:: Decrease in hearing Vision Screening - Comments:: Last exam 04/2021, Dr. Kerin Ransom, wears  glasses for driving  Dietary issues and exercise activities discussed: Current Exercise Habits: The patient does not participate in regular exercise at present, Type of exercise: walking, Time (Minutes): 60, Frequency (Times/Week): 1, Weekly Exercise (Minutes/Week): 60, Intensity: Mild   Goals Addressed             This Visit's Progress    Patient Stated       Would like to lose weight       Depression Screen PHQ 2/9 Scores 05/14/2021 04/01/2020 03/04/2020 02/21/2019 02/15/2018 12/03/2016  PHQ - 2 Score 0 0 0 0 0 0  PHQ- 9 Score - 0 0 0 0 -    Fall Risk Fall Risk  05/14/2021 04/01/2020 03/04/2020 02/21/2019 03/27/2018  Falls in the past year? 0 0 0 0 1  Comment - - - - Emmi Telephone Survey: data to providers prior to load  Number falls in past yr: 0 0 0 - 1  Comment - - - - Emmi Telephone Survey Actual Response = 1  Injury with Fall? 0 0 0 - 0  Risk for fall due to : No Fall Risks Medication side effect - Medication side effect -  Follow up Falls prevention discussed Falls evaluation completed;Falls prevention discussed - Falls evaluation completed;Falls prevention discussed -    FALL RISK PREVENTION PERTAINING TO THE HOME:  Any stairs in or around the home? Yes  If so, are there any without handrails? No  Home free of loose throw rugs in walkways, pet beds, electrical cords, etc? Yes  Adequate lighting in your home to reduce risk of falls? Yes   ASSISTIVE DEVICES UTILIZED TO PREVENT FALLS:  Life alert? No  Use of a cane, walker or w/c? No  Grab bars in the bathroom? Yes  Shower chair or bench in shower? Yes  Elevated toilet seat or a handicapped toilet? Yes   TIMED UP AND GO:  Was the test performed? No  .    Cognitive Function: Normal cognitive status assessed by this Nurse Health Advisor. No abnormalities found.   MMSE - Mini Mental State Exam 04/01/2020 02/21/2019 02/15/2018 12/03/2016  Orientation to time 5 5 5 5   Orientation to Place 5 5 5 5   Registration 3 3 3 3   Attention/ Calculation 5 5 0 0  Recall 3 3 3 3   Language- name 2 objects - - 0 0  Language- repeat 1 1 1 1   Language- follow 3 step command - - 3 3  Language- read & follow direction - - 0 0  Write a sentence - - 0 0  Copy design - - 0 0  Total score - - 20 20        Immunizations Immunization History  Administered Date(s) Administered   Fluad Quad(high Dose 65+) 02/18/2020   Influenza, High Dose Seasonal PF 02/17/2016, 02/25/2017, 02/15/2019, 03/05/2021   Influenza,inj,Quad PF,6+ Mos 02/15/2018   Moderna Sars-Covid-2 Vaccination 06/09/2019, 07/07/2019, 03/07/2020   PFIZER Comirnaty(Gray Top)Covid-19 Tri-Sucrose Vaccine 08/15/2020   Pneumococcal Conjugate-13 05/10/2014   Pneumococcal Polysaccharide-23 02/13/2015   Tdap 05/10/2010   Zoster Recombinat (Shingrix) 05/15/2018, 07/10/2018   Zoster, Live 06/10/2014    TDAP status: Due, Education has been provided regarding the importance of this vaccine. Advised may receive this vaccine at local pharmacy or Health Dept. Aware to provide a copy of the vaccination record if obtained from local pharmacy or Health Dept. Verbalized acceptance and understanding.  Flu Vaccine status: Up to date  Pneumococcal vaccine status: Up to date  Covid-19  vaccine status: Information provided on how to obtain vaccines.   Qualifies for Shingles Vaccine? Yes   Zostavax completed Yes   Shingrix Completed?: Yes  Screening Tests Health Maintenance  Topic Date Due   OPHTHALMOLOGY EXAM  04/09/2020   TETANUS/TDAP  05/10/2020   HEMOGLOBIN A1C  08/26/2020   COVID-19 Vaccine (5 - Booster for Moderna series) 10/10/2020   URINE MICROALBUMIN  02/25/2021   FOOT EXAM  03/04/2021    COLONOSCOPY (Pts 45-65yrs Insurance coverage will need to be confirmed)  09/17/2021   Pneumonia Vaccine 86+ Years old  Completed   INFLUENZA VACCINE  Completed   DEXA SCAN  Completed   Hepatitis C Screening  Completed   Zoster Vaccines- Shingrix  Completed   HPV VACCINES  Aged Out    Health Maintenance  Health Maintenance Due  Topic Date Due   OPHTHALMOLOGY EXAM  04/09/2020   TETANUS/TDAP  05/10/2020   HEMOGLOBIN A1C  08/26/2020   COVID-19 Vaccine (5 - Booster for Moderna series) 10/10/2020   URINE MICROALBUMIN  02/25/2021   FOOT EXAM  03/04/2021    Colorectal cancer screening: Type of screening: Colonoscopy. Completed 09/17/16. Repeat every 5 years  Mammogram status: Completed 06/09/20. Repeat every year  Bone Density status: Completed 05/31/19. Results reflect: Bone density results: NORMAL. Repeat every 2 years.  Lung Cancer Screening: (Low Dose CT Chest recommended if Age 25-80 years, 30 pack-year currently smoking OR have quit w/in 15years.) does not qualify.     Additional Screening:  Hepatitis C Screening: does qualify; Completed 03/04/16  Vision Screening: Recommended annual ophthalmology exams for early detection of glaucoma and other disorders of the eye. Is the patient up to date with their annual eye exam?  Yes  Who is the provider or what is the name of the office in which the patient attends annual eye exams? Dr Kerin Ransom   Dental Screening: Recommended annual dental exams for proper oral hygiene  Community Resource Referral / Chronic Care Management: CRR required this visit?  No   CCM required this visit?  No      Plan:     I have personally reviewed and noted the following in the patients chart:   Medical and social history Use of alcohol, tobacco or illicit drugs  Current medications and supplements including opioid prescriptions.  Functional ability and status Nutritional status Physical activity Advanced directives List of other  physicians Hospitalizations, surgeries, and ER visits in previous 12 months Vitals Screenings to include cognitive, depression, and falls Referrals and appointments  In addition, I have reviewed and discussed with patient certain preventive protocols, quality metrics, and best practice recommendations. A written personalized care plan for preventive services as well as general preventive health recommendations were provided to patient.    Due to this being a telephonic visit, the after visit summary with patients personalized plan was offered to patient via mail or my-chart. Patient would like to access on my-chart.      Loma Messing, LPN   05/15/1094   Nurse Health Advisor  Nurse Notes: none

## 2021-05-13 ENCOUNTER — Other Ambulatory Visit: Payer: Self-pay | Admitting: Primary Care

## 2021-05-13 DIAGNOSIS — E7849 Other hyperlipidemia: Secondary | ICD-10-CM

## 2021-05-14 ENCOUNTER — Ambulatory Visit (INDEPENDENT_AMBULATORY_CARE_PROVIDER_SITE_OTHER): Payer: Medicare Other

## 2021-05-14 ENCOUNTER — Other Ambulatory Visit: Payer: Self-pay | Admitting: Primary Care

## 2021-05-14 VITALS — Ht 66.0 in | Wt 220.0 lb

## 2021-05-14 DIAGNOSIS — Z0000 Encounter for general adult medical examination without abnormal findings: Secondary | ICD-10-CM | POA: Diagnosis not present

## 2021-05-14 DIAGNOSIS — Z780 Asymptomatic menopausal state: Secondary | ICD-10-CM | POA: Diagnosis not present

## 2021-05-14 DIAGNOSIS — E119 Type 2 diabetes mellitus without complications: Secondary | ICD-10-CM

## 2021-05-14 NOTE — Patient Instructions (Signed)
Ms. Kelsey Weaver , Thank you for taking time to complete your Medicare Wellness Visit. I appreciate your ongoing commitment to your health goals. Please review the following plan we discussed and let me know if I can assist you in the future.   Screening recommendations/referrals: Colonoscopy: up to date, completed 09/17/16, due 09/17/21 Mammogram: up to date, completed 06/09/20, next appointment scheduled for 06/11/21 Bone Density: up to date, completed 06/07/19, ordered today, someone will call to schedule an appointment Recommended yearly ophthalmology/optometry visit for glaucoma screening and checkup Recommended yearly dental visit for hygiene and checkup  Vaccinations: Influenza vaccine: up to date Pneumococcal vaccine: up to date Tdap vaccine: Due-last completed 05/10/10,May obtain vaccine at our office or your local pharmacy. Shingles vaccine: up to date    Covid-19:newest booster available at your local pharmacy  Advanced directives: information available at your next appointment  Conditions/risks identified: see problem list  Next appointment: Follow up in one year for your annual wellness visit 05/19/22 @ 11:15am, this will be a telephone visit.    Preventive Care 49 Years and Older, Female Preventive care refers to lifestyle choices and visits with your health care provider that can promote health and wellness. What does preventive care include? A yearly physical exam. This is also called an annual well check. Dental exams once or twice a year. Routine eye exams. Ask your health care provider how often you should have your eyes checked. Personal lifestyle choices, including: Daily care of your teeth and gums. Regular physical activity. Eating a healthy diet. Avoiding tobacco and drug use. Limiting alcohol use. Practicing safe sex. Taking low-dose aspirin every day. Taking vitamin and mineral supplements as recommended by your health care provider. What happens during an annual  well check? The services and screenings done by your health care provider during your annual well check will depend on your age, overall health, lifestyle risk factors, and family history of disease. Counseling  Your health care provider may ask you questions about your: Alcohol use. Tobacco use. Drug use. Emotional well-being. Home and relationship well-being. Sexual activity. Eating habits. History of falls. Memory and ability to understand (cognition). Work and work Statistician. Reproductive health. Screening  You may have the following tests or measurements: Height, weight, and BMI. Blood pressure. Lipid and cholesterol levels. These may be checked every 5 years, or more frequently if you are over 23 years old. Skin check. Lung cancer screening. You may have this screening every year starting at age 1 if you have a 30-pack-year history of smoking and currently smoke or have quit within the past 15 years. Fecal occult blood test (FOBT) of the stool. You may have this test every year starting at age 66. Flexible sigmoidoscopy or colonoscopy. You may have a sigmoidoscopy every 5 years or a colonoscopy every 10 years starting at age 86. Hepatitis C blood test. Hepatitis B blood test. Sexually transmitted disease (STD) testing. Diabetes screening. This is done by checking your blood sugar (glucose) after you have not eaten for a while (fasting). You may have this done every 1-3 years. Bone density scan. This is done to screen for osteoporosis. You may have this done starting at age 41. Mammogram. This may be done every 1-2 years. Talk to your health care provider about how often you should have regular mammograms. Talk with your health care provider about your test results, treatment options, and if necessary, the need for more tests. Vaccines  Your health care provider may recommend certain vaccines, such as: Influenza  vaccine. This is recommended every year. Tetanus, diphtheria,  and acellular pertussis (Tdap, Td) vaccine. You may need a Td booster every 10 years. Zoster vaccine. You may need this after age 45. Pneumococcal 13-valent conjugate (PCV13) vaccine. One dose is recommended after age 43. Pneumococcal polysaccharide (PPSV23) vaccine. One dose is recommended after age 76. Talk to your health care provider about which screenings and vaccines you need and how often you need them. This information is not intended to replace advice given to you by your health care provider. Make sure you discuss any questions you have with your health care provider. Document Released: 05/23/2015 Document Revised: 01/14/2016 Document Reviewed: 02/25/2015 Elsevier Interactive Patient Education  2017 [place] Prevention in the Home Falls can cause injuries. They can happen to people of all ages. There are many things you can do to make your home safe and to help prevent falls. What can I do on the outside of my home? Regularly fix the edges of walkways and driveways and fix any cracks. Remove anything that might make you trip as you walk through a door, such as a raised step or threshold. Trim any bushes or trees on the path to your home. Use bright outdoor lighting. Clear any walking paths of anything that might make someone trip, such as rocks or tools. Regularly check to see if handrails are loose or broken. Make sure that both sides of any steps have handrails. Any raised decks and porches should have guardrails on the edges. Have any leaves, snow, or ice cleared regularly. Use sand or salt on walking paths during winter. Clean up any spills in your garage right away. This includes oil or grease spills. What can I do in the bathroom? Use night lights. Install grab bars by the toilet and in the tub and shower. Do not use towel bars as grab bars. Use non-skid mats or decals in the tub or shower. If you need to sit down in the shower, use a plastic, non-slip  stool. Keep the floor dry. Clean up any water that spills on the floor as soon as it happens. Remove soap buildup in the tub or shower regularly. Attach bath mats securely with double-sided non-slip rug tape. Do not have throw rugs and other things on the floor that can make you trip. What can I do in the bedroom? Use night lights. Make sure that you have a light by your bed that is easy to reach. Do not use any sheets or blankets that are too big for your bed. They should not hang down onto the floor. Have a firm chair that has side arms. You can use this for support while you get dressed. Do not have throw rugs and other things on the floor that can make you trip. What can I do in the kitchen? Clean up any spills right away. Avoid walking on wet floors. Keep items that you use a lot in easy-to-reach places. If you need to reach something above you, use a strong step stool that has a grab bar. Keep electrical cords out of the way. Do not use floor polish or wax that makes floors slippery. If you must use wax, use non-skid floor wax. Do not have throw rugs and other things on the floor that can make you trip. What can I do with my stairs? Do not leave any items on the stairs. Make sure that there are handrails on both sides of the stairs and use them. Fix  handrails that are broken or loose. Make sure that handrails are as long as the stairways. Check any carpeting to make sure that it is firmly attached to the stairs. Fix any carpet that is loose or worn. Avoid having throw rugs at the top or bottom of the stairs. If you do have throw rugs, attach them to the floor with carpet tape. Make sure that you have a light switch at the top of the stairs and the bottom of the stairs. If you do not have them, ask someone to add them for you. What else can I do to help prevent falls? Wear shoes that: Do not have high heels. Have rubber bottoms. Are comfortable and fit you well. Are closed at the  toe. Do not wear sandals. If you use a stepladder: Make sure that it is fully opened. Do not climb a closed stepladder. Make sure that both sides of the stepladder are locked into place. Ask someone to hold it for you, if possible. Clearly mark and make sure that you can see: Any grab bars or handrails. First and last steps. Where the edge of each step is. Use tools that help you move around (mobility aids) if they are needed. These include: Canes. Walkers. Scooters. Crutches. Turn on the lights when you go into a dark area. Replace any light bulbs as soon as they burn out. Set up your furniture so you have a clear path. Avoid moving your furniture around. If any of your floors are uneven, fix them. If there are any pets around you, be aware of where they are. Review your medicines with your doctor. Some medicines can make you feel dizzy. This can increase your chance of falling. Ask your doctor what other things that you can do to help prevent falls. This information is not intended to replace advice given to you by your health care provider. Make sure you discuss any questions you have with your health care provider. Document Released: 02/20/2009 Document Revised: 10/02/2015 Document Reviewed: 05/31/2014 Elsevier Interactive Patient Education  2017 [employer].

## 2021-05-15 ENCOUNTER — Other Ambulatory Visit: Payer: Self-pay | Admitting: Primary Care

## 2021-05-15 DIAGNOSIS — E119 Type 2 diabetes mellitus without complications: Secondary | ICD-10-CM

## 2021-05-15 DIAGNOSIS — E7849 Other hyperlipidemia: Secondary | ICD-10-CM

## 2021-05-17 ENCOUNTER — Other Ambulatory Visit: Payer: Self-pay | Admitting: Internal Medicine

## 2021-05-22 ENCOUNTER — Encounter: Payer: Medicare Other | Admitting: Primary Care

## 2021-05-26 ENCOUNTER — Other Ambulatory Visit
Admission: RE | Admit: 2021-05-26 | Discharge: 2021-05-26 | Disposition: A | Discharge status: Home / Self Care | Payer: Medicare Other | Attending: Internal Medicine | Admitting: Internal Medicine

## 2021-05-26 ENCOUNTER — Encounter (HOSPITAL_COMMUNITY): Payer: Self-pay

## 2021-05-26 ENCOUNTER — Telehealth (HOSPITAL_COMMUNITY): Payer: Self-pay | Admitting: Emergency Medicine

## 2021-05-26 ENCOUNTER — Other Ambulatory Visit (HOSPITAL_COMMUNITY): Payer: Self-pay | Admitting: Emergency Medicine

## 2021-05-26 DIAGNOSIS — I10 Essential (primary) hypertension: Secondary | ICD-10-CM | POA: Diagnosis not present

## 2021-05-26 DIAGNOSIS — I480 Paroxysmal atrial fibrillation: Secondary | ICD-10-CM

## 2021-05-26 LAB — BASIC METABOLIC PANEL
Anion gap: 10 (ref 5–15)
BUN: 24 mg/dL — ABNORMAL HIGH (ref 8–23)
CO2: 25 mmol/L (ref 22–32)
Calcium: 9.7 mg/dL (ref 8.9–10.3)
Chloride: 101 mmol/L (ref 98–111)
Creatinine, Ser: 0.67 mg/dL (ref 0.44–1.00)
GFR, Estimated: >60 mL/min (ref >60)
Glucose, Bld: 91 mg/dL (ref 70–99)
Potassium: 4 mmol/L (ref 3.5–5.1)
Sodium: 136 mmol/L (ref 135–145)

## 2021-05-26 NOTE — Telephone Encounter (Signed)
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   Sending mychart message with CCTA instructions so she knows how to prepare, call back number provided for further questions  Marchia Bond RN Navigator Cardiac Imaging [facility] [phone] Office  [phone] Cell

## 2021-05-26 NOTE — Telephone Encounter (Signed)
Attempted to call patient regarding upcoming cardiac CT appointment. Left message on voicemail with name and callback number Marchia Bond RN Navigator Cardiac [place] [facility] [phone] Office [phone] Cell  Reminded to get BMP prior to scan

## 2021-05-29 ENCOUNTER — Other Ambulatory Visit: Payer: Self-pay | Admitting: Cardiology

## 2021-05-29 ENCOUNTER — Ambulatory Visit (HOSPITAL_COMMUNITY)
Admission: RE | Admit: 2021-05-29 | Discharge: 2021-05-29 | Disposition: A | Discharge status: Home / Self Care | Payer: Medicare Other | Source: Ambulatory Visit | Attending: Internal Medicine | Admitting: Internal Medicine

## 2021-05-29 ENCOUNTER — Other Ambulatory Visit: Payer: Self-pay

## 2021-05-29 DIAGNOSIS — R931 Abnormal findings on diagnostic imaging of heart and coronary circulation: Secondary | ICD-10-CM | POA: Insufficient documentation

## 2021-05-29 DIAGNOSIS — E7800 Pure hypercholesterolemia, unspecified: Secondary | ICD-10-CM

## 2021-05-29 DIAGNOSIS — I10 Essential (primary) hypertension: Secondary | ICD-10-CM | POA: Diagnosis not present

## 2021-05-29 DIAGNOSIS — I2510 Atherosclerotic heart disease of native coronary artery without angina pectoris: Secondary | ICD-10-CM

## 2021-05-29 DIAGNOSIS — I480 Paroxysmal atrial fibrillation: Secondary | ICD-10-CM

## 2021-05-29 MED ORDER — DILTIAZEM HCL 25 MG/5ML IV SOLN: 5.0000 mg | Freq: Once | INTRAVENOUS | Status: AC

## 2021-05-29 MED ORDER — NITROGLYCERIN 0.4 MG SL SUBL: 0.8000 mg | SUBLINGUAL_TABLET | Freq: Once | SUBLINGUAL | Status: AC

## 2021-05-29 MED ORDER — DILTIAZEM HCL 25 MG/5ML IV SOLN
INTRAVENOUS | Status: AC
  Administered 2021-05-29: 5 mg via INTRAVENOUS
  Filled 2021-05-29: qty 5

## 2021-05-29 MED ORDER — IOHEXOL 350 MG/ML SOLN
100.0000 mL | Freq: Once | INTRAVENOUS | Status: AC | PRN
  Administered 2021-05-29: 100 mL via INTRAVENOUS

## 2021-05-29 MED ORDER — NITROGLYCERIN 0.4 MG SL SUBL
SUBLINGUAL_TABLET | SUBLINGUAL | Status: AC
  Administered 2021-05-29: 0.8 mg via SUBLINGUAL
  Filled 2021-05-29: qty 2

## 2021-05-30 ENCOUNTER — Ambulatory Visit (HOSPITAL_COMMUNITY)
Admission: RE | Admit: 2021-05-30 | Discharge: 2021-05-30 | Disposition: A | Discharge status: Home / Self Care | Payer: Medicare Other | Source: Ambulatory Visit | Attending: Cardiology | Admitting: Cardiology

## 2021-05-30 DIAGNOSIS — R931 Abnormal findings on diagnostic imaging of heart and coronary circulation: Secondary | ICD-10-CM

## 2021-05-30 DIAGNOSIS — E7800 Pure hypercholesterolemia, unspecified: Secondary | ICD-10-CM | POA: Diagnosis not present

## 2021-05-30 DIAGNOSIS — I10 Essential (primary) hypertension: Secondary | ICD-10-CM | POA: Diagnosis not present

## 2021-05-30 DIAGNOSIS — I480 Paroxysmal atrial fibrillation: Secondary | ICD-10-CM | POA: Diagnosis not present

## 2021-06-01 ENCOUNTER — Telehealth: Payer: Self-pay

## 2021-06-01 DIAGNOSIS — I25119 Atherosclerotic heart disease of native coronary artery with unspecified angina pectoris: Secondary | ICD-10-CM

## 2021-06-01 NOTE — Telephone Encounter (Signed)
-----   Message from Thompson Grayer, MD sent at 05/30/2021  8:52 PM EST ----- Results reviewed.  Kelsey Weaver, please inform the patient of result. I will route to primary care also.  Though CAD appears to be mostly nonobstructive, I would like for her to establish with cardiology in [place].  Please schedule to see Dr Rockey Situ there.

## 2021-06-01 NOTE — Telephone Encounter (Signed)
Referral placed to Dr. Rockey Situ.  Mychart message sent to Pt to advise.

## 2021-06-03 ENCOUNTER — Telehealth: Payer: Self-pay | Admitting: Primary Care

## 2021-06-03 ENCOUNTER — Other Ambulatory Visit: Payer: Self-pay | Admitting: Primary Care

## 2021-06-03 DIAGNOSIS — E119 Type 2 diabetes mellitus without complications: Secondary | ICD-10-CM

## 2021-06-03 DIAGNOSIS — E7849 Other hyperlipidemia: Secondary | ICD-10-CM

## 2021-06-03 DIAGNOSIS — G4700 Insomnia, unspecified: Secondary | ICD-10-CM

## 2021-06-03 NOTE — Addendum Note (Signed)
Addended by: Carter Kitten on: 06/03/2021 03:39 PM   Modules accepted: Orders

## 2021-06-03 NOTE — Telephone Encounter (Signed)
Pt needs the following refill sent to [facility]  -metFORMIN (GLUCOPHAGE-XR) 500 MG 24 hr tablet -traZODone (DESYREL) 50 MG tablet -pravastatin (PRAVACHOL) 40 MG tablet

## 2021-06-03 NOTE — Telephone Encounter (Signed)
This patient has not been seen by me since October 2021 and needs an appointment much sooner than currently scheduled, which is late March 2023.  Please put her in for an appointment as soon as we open up in February. Let me know when this has been done.

## 2021-06-04 ENCOUNTER — Other Ambulatory Visit: Payer: Self-pay | Admitting: Primary Care

## 2021-06-04 DIAGNOSIS — G4700 Insomnia, unspecified: Secondary | ICD-10-CM

## 2021-06-04 MED ORDER — TRAZODONE HCL 50 MG PO TABS: ORAL_TABLET | ORAL | 0 refills | Status: DC

## 2021-06-04 MED ORDER — PRAVASTATIN SODIUM 40 MG PO TABS: 40.0000 mg | ORAL_TABLET | Freq: Every evening | ORAL | 0 refills | Status: DC

## 2021-06-04 MED ORDER — METFORMIN HCL ER 500 MG PO TB24: 500.0000 mg | ORAL_TABLET | Freq: Every day | ORAL | 0 refills | Status: DC

## 2021-06-04 NOTE — Telephone Encounter (Signed)
Pt has been scheduled for 07/01/21

## 2021-06-04 NOTE — Telephone Encounter (Signed)
Patient needs to be seen sooner than scheduled. Please schedule her for follow up visit for ASAP once we are back in the office at [facility]. Mid February please.  Let me know once you've spoken with her

## 2021-06-04 NOTE — Telephone Encounter (Signed)
Noted  

## 2021-06-05 ENCOUNTER — Other Ambulatory Visit: Payer: Self-pay | Admitting: Primary Care

## 2021-06-05 NOTE — Telephone Encounter (Signed)
Looks like she has something for med follow up on 2/22 do you still need sooner?

## 2021-06-05 NOTE — Telephone Encounter (Signed)
That was not scheduled earlier, or maybe I missed it. Okay to see on 2/22 as scheduled.  Can you change that visit to a CPE/medication refill? We don't need the March one scheduled, unless she wants it

## 2021-06-09 ENCOUNTER — Other Ambulatory Visit: Payer: Self-pay | Admitting: Internal Medicine

## 2021-06-09 DIAGNOSIS — I480 Paroxysmal atrial fibrillation: Secondary | ICD-10-CM

## 2021-06-10 ENCOUNTER — Ambulatory Visit (INDEPENDENT_AMBULATORY_CARE_PROVIDER_SITE_OTHER): Payer: Medicare Other

## 2021-06-10 ENCOUNTER — Other Ambulatory Visit: Payer: Self-pay

## 2021-06-10 DIAGNOSIS — I480 Paroxysmal atrial fibrillation: Secondary | ICD-10-CM | POA: Diagnosis not present

## 2021-06-10 LAB — ECHOCARDIOGRAM COMPLETE
AR max vel: 2.33 cm2
AV Area VTI: 2.39 cm2
AV Area mean vel: 2.15 cm2
AV Mean grad: 3 mmHg
AV Peak grad: 5 mmHg
Ao pk vel: 1.12 m/s
Area-P 1/2: 4.06 cm2
P 1/2 time: 450 msec
S' Lateral: 3 cm
Single Plane A4C EF: 54.1 %

## 2021-06-11 ENCOUNTER — Ambulatory Visit
Admission: RE | Admit: 2021-06-11 | Discharge: 2021-06-11 | Disposition: A | Discharge status: Home / Self Care | Payer: Medicare Other | Source: Ambulatory Visit | Attending: Primary Care | Admitting: Primary Care

## 2021-06-11 DIAGNOSIS — Z1231 Encounter for screening mammogram for malignant neoplasm of breast: Secondary | ICD-10-CM | POA: Insufficient documentation

## 2021-06-11 NOTE — Telephone Encounter (Signed)
Mychart sent to pt asking if she needs the march appt for any reason.

## 2021-06-11 NOTE — Telephone Encounter (Signed)
Pt cancelled March appt.

## 2021-06-24 ENCOUNTER — Encounter: Payer: Self-pay | Admitting: Internal Medicine

## 2021-06-24 ENCOUNTER — Ambulatory Visit: Payer: Medicare Other | Admitting: Primary Care

## 2021-06-29 ENCOUNTER — Other Ambulatory Visit: Payer: Self-pay | Admitting: Primary Care

## 2021-06-29 DIAGNOSIS — E119 Type 2 diabetes mellitus without complications: Secondary | ICD-10-CM

## 2021-06-29 DIAGNOSIS — E7849 Other hyperlipidemia: Secondary | ICD-10-CM

## 2021-06-30 ENCOUNTER — Ambulatory Visit (INDEPENDENT_AMBULATORY_CARE_PROVIDER_SITE_OTHER): Payer: Medicare Other | Admitting: Cardiovascular Disease

## 2021-06-30 ENCOUNTER — Encounter: Payer: Self-pay | Admitting: Cardiovascular Disease

## 2021-06-30 ENCOUNTER — Other Ambulatory Visit: Payer: Self-pay

## 2021-06-30 VITALS — BP 138/72 | HR 77 | Ht 66.0 in | Wt 211.4 lb

## 2021-06-30 DIAGNOSIS — I25118 Atherosclerotic heart disease of native coronary artery with other forms of angina pectoris: Secondary | ICD-10-CM

## 2021-06-30 DIAGNOSIS — E782 Mixed hyperlipidemia: Secondary | ICD-10-CM | POA: Diagnosis not present

## 2021-06-30 DIAGNOSIS — I480 Paroxysmal atrial fibrillation: Secondary | ICD-10-CM | POA: Diagnosis not present

## 2021-06-30 DIAGNOSIS — Z01810 Encounter for preprocedural cardiovascular examination: Secondary | ICD-10-CM

## 2021-06-30 DIAGNOSIS — E7849 Other hyperlipidemia: Secondary | ICD-10-CM | POA: Diagnosis not present

## 2021-06-30 DIAGNOSIS — I700 Atherosclerosis of aorta: Secondary | ICD-10-CM

## 2021-06-30 MED ORDER — PRAVASTATIN SODIUM 40 MG PO TABS: 40.0000 mg | ORAL_TABLET | Freq: Every evening | ORAL | 3 refills | Status: DC

## 2021-06-30 MED ORDER — FLECAINIDE ACETATE 50 MG PO TABS: 50.0000 mg | ORAL_TABLET | Freq: Two times a day (BID) | ORAL | 3 refills | Status: DC

## 2021-06-30 MED ORDER — ASPIRIN EC 81 MG PO TBEC: 81.0000 mg | DELAYED_RELEASE_TABLET | Freq: Every day | ORAL | 3 refills | Status: AC

## 2021-06-30 MED ORDER — ATENOLOL 25 MG PO TABS: 25.0000 mg | ORAL_TABLET | Freq: Every day | ORAL | 3 refills | Status: DC

## 2021-06-30 MED ORDER — EZETIMIBE 10 MG PO TABS: 10.0000 mg | ORAL_TABLET | Freq: Every day | ORAL | 3 refills | Status: DC

## 2021-06-30 NOTE — Progress Notes (Signed)
Cardiology Office Note  Date:  06/30/2021   ID:  Kelsey Weaver, Kelsey Weaver 02/14/1946, MRN [id number]  PCP:  Pleas Koch, NP   Chief Complaint  Patient presents with   New Patient (Initial Visit)    Establish care for A-Fib; former Dr. Thompson Grayer patient & follow up recent Cardiac CT & Echo. Medications reviewed by the patient verbally.     HPI:  Ms. Kelsey Weaver is a 76 year old woman with past medical history of Atrial fibrillation, paroxysmal since 2002 COVID 2022 Diabetes Hypertension Hyperlipidemia Who presents for new patient evaluation to the [facility] office for follow-up of her coronary disease and A-fib  Under visit today, discussed her EP history Ablation x 4 for atrial fibrillation No recent afib spell, "years" Managed by Dr. 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 Coronary calcium score of 1033. This was 94th percentile for age-, sex, and race-matched controls.   2.  Normal coronary origin with right dominance.   3. Moderate atherosclerosis with heavily calcified vessels. CAD RADS  4. Consider symptom-guided anti-ischemic and preventive pharmacotherapy as well as risk factor modification per guideline-directed care.   Coronary CTA FFR flow analysis demonstrates no hemodynamically flow limiting lesions.  Echocardiogram June 10, 2021, discussed  1. Left ventricular ejection fraction, by estimation, is 55 to 60%. The  left ventricle has normal function. The left ventricle has no regional  wall motion abnormalities. Left ventricular diastolic parameters  are  consistent with Grade II diastolic  dysfunction (pseudonormalization).   2. Right ventricular systolic function is normal. The right ventricular  size is mildly enlarged.   3. Left atrial size was mildly dilated.   4. The mitral valve is degenerative. Mild mitral valve regurgitation.   5. The aortic valve is tricuspid. Aortic valve regurgitation is mild.  Aortic valve sclerosis is present, with no evidence of aortic valve  stenosis.    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  PSH:    Past Surgical History:  Procedure Laterality Date   ABLATION  10/31/2007, 08/26/2008, 09/22/2012, 07/01/2015   AFib ablation x 4 in [place]   CATARACT EXTRACTION Bilateral    COLONOSCOPY     CYSTECTOMY     subsebacous x 2   DILATION AND CURETTAGE OF UTERUS  at age 64   EYE SURGERY     FOOT SURGERY Bilateral 2008, 2014   4 hammertoes and bunionectomy   implantable loop recorder pacement  01/19/13   MDT Reveal LINQ implanted in [place] for afib management   JOINT REPLACEMENT Left 07/2017   shoulder   KNEE SURGERY Left 2016   arthoscopy    LIGATION / DIVISION SAPHENOUS VEIN Bilateral    POLYPECTOMY     REVERSE SHOULDER ARTHROPLASTY Left 08/02/2017   Procedure: TOTAL SHOULDER ARTHROPLASTY VS. REVERSE;  Surgeon: Corky Mull, MD;  Location: [facility] ORS;  Service: Orthopedics;  Laterality: Left;   TOTAL KNEE ARTHROPLASTY Left 06/08/2018   Procedure: TOTAL KNEE ARTHROPLASTY;  Surgeon: Corky Mull, MD;  Location: [facility] ORS;  Service: Orthopedics;  Laterality: Left;     Current Outpatient Medications  Medication Sig Dispense Refill   acetaminophen (TYLENOL) 650 MG CR tablet Take 650 mg by mouth every 8 (eight) hours as needed for pain. Patient taking 2 times per day     aspirin EC 81 MG tablet Take 1 tablet (81 mg total) by mouth daily. Swallow whole. 90 tablet 3   Cholecalciferol (VITAMIN D3) 125 MCG (5000 UT) CAPS Take 1 capsule by mouth daily.      Cyanocobalamin (VITAMIN B-12 PO) Take 1 tablet by mouth daily.     diclofenac sodium (VOLTAREN) 1 % GEL Apply 2 g topically 3 (three) times daily as needed (for knee pain).   1   ezetimibe (ZETIA) 10 MG tablet Take 1 tablet (10 mg total) by mouth daily. 90 tablet 3   famotidine (PEPCID) 20 MG tablet Take 20 mg by mouth at bedtime.      ibuprofen (ADVIL,MOTRIN) 800 MG tablet Take 800 mg by mouth every 8 (eight) hours as needed for headache or moderate pain.      metFORMIN (GLUCOPHAGE-XR) 500 MG 24 hr tablet Take 1 tablet (500 mg total) by mouth daily with breakfast. For diabetes. Office visit required for further refills. 30 tablet 0   Multiple Vitamins-Minerals (PRESERVISION AREDS 2) CAPS Take 1 tablet by mouth daily.      Omega-3 Fatty Acids (FISH OIL) 1200 MG CAPS daily.     traZODone (DESYREL) 50 MG tablet TAKE 1-2 TABLETS BY MOUTH AT BEDTIME AS NEEDED FOR SLEEP. Office visit required for further refills. 60 tablet 0   triamterene-hydrochlorothiazide (MAXZIDE-25) 37.5-25 MG tablet TAKE 0.5 TABLETS BY MOUTH DAILY AS NEEDED (EDEMA). 45 tablet 3   Turmeric 500 MG TABS Take 500 mg by mouth daily.     atenolol (TENORMIN) 25 MG tablet Take 1 tablet (25 mg total) by mouth daily. 90 tablet 3   flecainide (TAMBOCOR) 50 MG tablet Take 1 tablet (50 mg total) by mouth 2 (two) times daily. 180 tablet 3   pravastatin (PRAVACHOL) 40 MG tablet Take 1 tablet (40 mg total) by mouth every evening. For cholesterol. Office visit required for further refills. 90 tablet 3   Current Facility-Administered Medications  Medication  Dose Route Frequency Provider Last Rate Last Admin   0.9 %  sodium chloride infusion  500 mL Intravenous Continuous Milus Banister, MD         Allergies:   Ambien [zolpidem tartrate], Compazine [prochlorperazine edisylate], Metoprolol, and Reglan [metoclopramide]   Social History:  The patient  reports that she has never smoked. She has never used smokeless tobacco. She reports current alcohol use. She reports that she does not use drugs.   Family History:   family history includes Atrial fibrillation in her brother and father; CAD in her brother and father; Cholelithiasis in her sister; Gallbladder disease in her sister; Glaucoma in her mother; Heart attack in her son; Heart disease (age of onset: 33) in her son; Hypercholesterolemia in her brother; Hypertension in her brother and mother.    Review of Systems: Review of Systems  Constitutional: Negative.   HENT: Negative.    Respiratory: Negative.    Cardiovascular: Negative.   Gastrointestinal: Negative.   Musculoskeletal: Negative.   Neurological: Negative.  Psychiatric/Behavioral: Negative.    All other systems reviewed and are negative.   PHYSICAL EXAM: VS:  BP 138/72 (BP Location: Right Arm, Patient Position: Sitting, Cuff Size: Normal)    Pulse 77    Ht 5\' 6"  (1.676 m)    Wt 211 lb 6 oz (95.9 kg)    SpO2 98%    BMI 34.12 kg/m  , BMI Body mass index is 34.12 kg/m. GEN: Well nourished, well developed, in no acute distress HEENT: normal Neck: no JVD, carotid bruits, or masses Cardiac: RRR; no murmurs, rubs, or gallops,no edema  Respiratory:  clear to auscultation bilaterally, normal work of breathing GI: soft, nontender, nondistended, + BS MS: no deformity or atrophy Skin: warm and dry, no rash Neuro:  Strength and sensation are intact Psych: euthymic mood, full affect  Recent Labs: 05/26/2021: BUN 24; Creatinine, Ser 0.67; Potassium 4.0; Sodium 136    Lipid Panel Lab Results  Component Value Date   CHOL 141  02/26/2020   HDL 44.90 02/26/2020   LDLCALC 63 02/26/2020   TRIG 167.0 (H) 02/26/2020      Wt Readings from Last 3 Encounters:  06/30/21 211 lb 6 oz (95.9 kg)  05/14/21 220 lb (99.8 kg)  05/08/21 223 lb (101.2 kg)     ASSESSMENT AND PLAN:  Problem List Items Addressed This Visit       Cardiology Problems   Atrial fibrillation (HCC)   Relevant Medications   flecainide (TAMBOCOR) 50 MG tablet   atenolol (TENORMIN) 25 MG tablet   ezetimibe (ZETIA) 10 MG tablet   aspirin EC 81 MG tablet   pravastatin (PRAVACHOL) 40 MG tablet   Other Relevant Orders   EKG 12-Lead   Hyperlipidemia   Relevant Medications   flecainide (TAMBOCOR) 50 MG tablet   atenolol (TENORMIN) 25 MG tablet   ezetimibe (ZETIA) 10 MG tablet   aspirin EC 81 MG tablet   pravastatin (PRAVACHOL) 40 MG tablet   Other Relevant Orders   EKG 12-Lead   Other Visit Diagnoses     Coronary artery disease of native artery of native heart with stable angina pectoris (HCC)    -  Primary   Relevant Medications   flecainide (TAMBOCOR) 50 MG tablet   atenolol (TENORMIN) 25 MG tablet   ezetimibe (ZETIA) 10 MG tablet   aspirin EC 81 MG tablet   pravastatin (PRAVACHOL) 40 MG tablet   Other Relevant Orders   EKG 12-Lead   Pre-procedural cardiovascular examination       Aortic atherosclerosis (HCC)       Relevant Medications   flecainide (TAMBOCOR) 50 MG tablet   atenolol (TENORMIN) 25 MG tablet   ezetimibe (ZETIA) 10 MG tablet   aspirin EC 81 MG tablet   pravastatin (PRAVACHOL) 40 MG tablet   Other Relevant Orders   EKG 12-Lead      Coronary artery disease with stable angina Calcium score of 1000, three-vessel coronary calcification noted Images pulled up and reviewed, FFR with nonobstructive disease Denies anginal symptoms, discussed in detail Recommend she call us for any change in her symptoms For now would add Zetia 10 mg daily to her pravastatin Non-smoker, no diabetes  Paroxysmal atrial  fibrillation Denies any breakthrough tachycardia Prefers to stay on atenolol 25 daily with flecainide 50 twice daily started by EP We will discuss with Dr. Rayann Heman given underlying coronary disease  Hyperlipidemia Continue pravastatin 40 daily, add Zetia 10 mg daily  Essential hypertension Blood pressure is well controlled on today's visit. No  changes made to the medications.    Total encounter time more than 60 minutes  Greater than 50% was spent in counseling and coordination of care with the patient    Signed, Esmond Plants, M.D., Ph.D. [place], [place]

## 2021-06-30 NOTE — Patient Instructions (Addendum)
Medication Instructions:  Please INCREASE Flecainide 50 mg twice a day Atenolol 25 daily  Please START Zetia 10 mg daily  for cholesterol Aspirin 81 mg daily  If you need a refill on your cardiac medications before your next appointment, please call your pharmacy.   Lab work: No new labs needed  Testing/Procedures: No new testing needed  Follow-Up: At [facility], you and your health needs are our priority.  As part of our continuing mission to provide you with exceptional heart care, we have created designated Provider Care Teams.  These Care Teams include your primary Cardiologist (physician) and Advanced Practice Providers (APPs -  Physician Assistants and Nurse Practitioners) who all work together to provide you with the care you need, when you need it.  You will need a follow up appointment in 12 months  Providers on your designated Care Team:   Murray Hodgkins, NP Christell Faith, PA-C Cadence Kathlen Mody, [place]  COVID-19 Vaccine Information can be found at: ShippingScam.co.uk For questions related to vaccine distribution or appointments, please email [email or web address] or call [phone].

## 2021-07-01 ENCOUNTER — Ambulatory Visit (INDEPENDENT_AMBULATORY_CARE_PROVIDER_SITE_OTHER): Payer: Medicare Other | Admitting: Primary Care

## 2021-07-01 ENCOUNTER — Encounter: Payer: Self-pay | Admitting: Primary Care

## 2021-07-01 VITALS — BP 132/68 | HR 72 | Temp 97.6°F | Ht 66.0 in | Wt 212.0 lb

## 2021-07-01 DIAGNOSIS — R600 Localized edema: Secondary | ICD-10-CM

## 2021-07-01 DIAGNOSIS — G4700 Insomnia, unspecified: Secondary | ICD-10-CM | POA: Diagnosis not present

## 2021-07-01 DIAGNOSIS — E1165 Type 2 diabetes mellitus with hyperglycemia: Secondary | ICD-10-CM | POA: Diagnosis not present

## 2021-07-01 DIAGNOSIS — E1141 Type 2 diabetes mellitus with diabetic mononeuropathy: Secondary | ICD-10-CM | POA: Diagnosis not present

## 2021-07-01 DIAGNOSIS — I2510 Atherosclerotic heart disease of native coronary artery without angina pectoris: Secondary | ICD-10-CM | POA: Insufficient documentation

## 2021-07-01 DIAGNOSIS — E7849 Other hyperlipidemia: Secondary | ICD-10-CM | POA: Diagnosis not present

## 2021-07-01 DIAGNOSIS — K219 Gastro-esophageal reflux disease without esophagitis: Secondary | ICD-10-CM

## 2021-07-01 LAB — MICROALBUMIN / CREATININE URINE RATIO
Creatinine,U: 209.3 mg/dL
Microalb Creat Ratio: 1.2 mg/g (ref 0.0–30.0)
Microalb, Ur: 2.4 mg/dL — ABNORMAL HIGH (ref 0.0–1.9)

## 2021-07-01 LAB — LIPID PANEL
Cholesterol: 138 mg/dL (ref 0–200)
HDL: 39.1 mg/dL (ref >39.00)
LDL Cholesterol: 66 mg/dL (ref 0–99)
NonHDL: 98.72
Total CHOL/HDL Ratio: 4
Triglycerides: 164 mg/dL — ABNORMAL HIGH (ref 0.0–149.0)
VLDL: 32.8 mg/dL (ref 0.0–40.0)

## 2021-07-01 LAB — HEPATIC FUNCTION PANEL
ALT: 35 U/L (ref 0–35)
AST: 28 U/L (ref 0–37)
Albumin: 4.8 g/dL (ref 3.5–5.2)
Alkaline Phosphatase: 59 U/L (ref 39–117)
Bilirubin, Direct: 0.1 mg/dL (ref 0.0–0.3)
Total Bilirubin: 0.5 mg/dL (ref 0.2–1.2)
Total Protein: 7.1 g/dL (ref 6.0–8.3)

## 2021-07-01 LAB — CBC
HCT: 45.1 % (ref 36.0–46.0)
Hemoglobin: 15 g/dL (ref 12.0–15.0)
MCHC: 33.3 g/dL (ref 30.0–36.0)
MCV: 93.8 fl (ref 78.0–100.0)
Platelets: 288 10*3/uL (ref 150.0–400.0)
RBC: 4.8 Mil/uL (ref 3.87–5.11)
RDW: 12.4 % (ref 11.5–15.5)
WBC: 5.7 10*3/uL (ref 4.0–10.5)

## 2021-07-01 LAB — HEMOGLOBIN A1C: Hgb A1c MFr Bld: 6.6 % — ABNORMAL HIGH (ref 4.6–6.5)

## 2021-07-01 MED ORDER — TRAZODONE HCL 150 MG PO TABS
150.0000 mg | ORAL_TABLET | Freq: Every day | ORAL | 3 refills | Status: DC
Start: 2021-07-01 — End: 2022-06-14

## 2021-07-01 MED ORDER — METFORMIN HCL ER 500 MG PO TB24: 500.0000 mg | ORAL_TABLET | Freq: Every day | ORAL | 1 refills | Status: DC

## 2021-07-01 NOTE — Assessment & Plan Note (Signed)
Controlled. Not evident on exam today.  Continue triamterene-hydrochlorothiazide 37.5-25, 1/2 tablet daily as needed.

## 2021-07-01 NOTE — Assessment & Plan Note (Signed)
Controlled.  Continue famotidine 20 mg daily.  

## 2021-07-01 NOTE — Assessment & Plan Note (Signed)
Chronic. Controlled per patient.  Continue to monitor.

## 2021-07-01 NOTE — Assessment & Plan Note (Signed)
Repeat lipid panel pending. LDL goal less than 70 given CAD findings  Continue pravastatin 40 mg daily, start Zetia 10 mg daily.

## 2021-07-01 NOTE — Assessment & Plan Note (Signed)
No recent A1C on file, has not followed up as recommended.  Repeat A1C pending.   Continue metformin XR 500 mg daily. Pneumonia vaccine UTD. Foot exam today. Urine microalbumin due pending. Managed on statin.   Follow up in 6 months

## 2021-07-01 NOTE — Assessment & Plan Note (Signed)
Controlled, however she has increased her dose to 150 mg and does well with this dose.  Prescription changed to trazodone 50 mg daily. New prescription sent to pharmacy.

## 2021-07-01 NOTE — Patient Instructions (Signed)
Stop by the lab prior to leaving today. I will notify you of your results once received.   Call to schedule your colonoscopy.  Start Zetia 10 mg daily for cholesterol  It was a pleasure to see you today!

## 2021-07-01 NOTE — Progress Notes (Signed)
Subjective:    Patient ID: Kelsey Weaver, female    DOB: [birth year], 76 y.o.   MRN: [id number]  HPI  Kelsey Weaver is a very pleasant 76 y.o. female with a history of atrial fibrillation, type 2 diabetes, diabetic neuropathy, osteoarthritis of left knee, insomnia, hyperlipidemia, restless legs, who presents today for follow-up of chronic conditions.  1) CAD/Atrial Fibrillation: Currently managed on flecainide 50 mg twice daily, atenolol 5 mg daily, aspirin 81 mg.  Following with cardiology, last visit was yesterday.  She recently underwent cardiac CTA which revealed that she was in the 94th percentile, three-vessel coronary calcification.  Zetia 10 mg daily was added to her pravastatin for better lipid control.  Consideration is underway regarding switching from atenolol to a different beta-blocker given her history of CAD.  She denies chest pain. She does have chronic fatigue. Significant family history of heart disease in numerous family members.   2) Type 2 Diabetes:   Current medications include: Metformin XR 500 mg daily  She is checking her blood glucose 0 times daily.  Last A1C: October 2021 6.6, pending today. Last Eye Exam:  Last Foot Exam: Due Pneumonia Vaccination: 2016 Urine Microalbumin: Due Statin: Pravastatin  Dietary changes since last visit:   Exercise:   3) Essential Hypertension: Currently managed on triamterene-hydrochlorothiazide 37.5-25 milligrams, 1/2 tablet daily as needed for lower extremity swelling. She's not taking triamterene-HCTZ often, no recent use.   BP Readings from Last 3 Encounters:  07/01/21 132/68  06/30/21 138/72  05/29/21 (!) 142/75     4) Hyperlipidemia: Currently managed on pravastatin 40 mg daily, Zetia 10 mg daily.   Immunizations: -Influenza: Completed the season -Covid-19: 3 vaccines -Shingles: Completed Shingrix and Zostavax -Pneumonia: Prevnar 13 in 2016?, Pneumovax and 2016  Mammogram: Completed in February  2023 Dexa: Completed in 2021, scheduled for March 2023 Colonoscopy: Completed in 2018, due now. She is aware.    BP Readings from Last 3 Encounters:  07/01/21 132/68  06/30/21 138/72  05/29/21 (!) 142/75      Review of Systems  Constitutional:  Positive for fatigue.  HENT:  Negative for rhinorrhea.   Respiratory:  Negative for shortness of breath.   Cardiovascular:  Negative for chest pain.  Gastrointestinal:  Negative for constipation and diarrhea.  Musculoskeletal:  Negative for arthralgias.  Skin:  Negative for rash.  Allergic/Immunologic: Negative for environmental allergies.  Neurological:  Positive for numbness. Negative for dizziness and headaches.  Psychiatric/Behavioral:  Negative for sleep disturbance. The patient is not nervous/anxious.         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    Social History   Socioeconomic History   Marital status: Married    Spouse name: Not on file   Number of children: 3   Years of education: Not on file   Highest education level: Not on file  Occupational History   Occupation: Retired  RN  Tobacco Use   Smoking status: Never   Smokeless tobacco: Never  Vaping Use   Vaping Use: Never used  Substance and Sexual Activity   Alcohol use: Yes    Alcohol/week: 0.0 standard drinks    Comment: socially   Drug use: No   Sexual activity: Not on file  Other Topics Concern    Not on file  Social History Narrative   Pt recently moved to [place]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    Once worked in L&D and NICU.    Son is a Marine scientist at [employer] and also works for [employer] .   Enjoys spending time with her family.    Social Determinants of Health   Financial Resource Strain: Low Risk    Difficulty of Paying Living Expenses: Not hard at all  Food Insecurity: No Food Insecurity   Worried About Charity fundraiser in the Last Year: Never true   [place] in the Last Year: Never true  Transportation Needs: No Transportation Needs   Lack of Transportation (Medical): No   Lack of Transportation (Non-Medical): No  Physical Activity: Insufficiently Active   Days of Exercise per Week: 1 day   Minutes of Exercise per Session: 120 min  Stress: No Stress Concern Present   Feeling of Stress : Not at all  Social Connections: Socially Integrated   Frequency of Communication with Friends and Family: More than three times a week   Frequency of Social Gatherings with Friends and Family: Twice a week   Attends Religious Services: More than 4 times per year   Active Member of Clubs or Organizations: Yes   Attends Music therapist: More than 4 times per year   Marital Status: Married  Human resources officer Violence: Not At Risk   Fear of Current or Ex-Partner: No   Emotionally Abused: No   Physically Abused: No   Sexually Abused: No    Past Surgical History:  Procedure Laterality Date   ABLATION  10/31/2007, 08/26/2008, 09/22/2012, 07/01/2015   AFib ablation x 4 in [place]   CATARACT EXTRACTION Bilateral    COLONOSCOPY     CYSTECTOMY     subsebacous x 2   DILATION AND CURETTAGE OF UTERUS  at age 46   EYE SURGERY     FOOT SURGERY Bilateral 2008, 2014   4 hammertoes and bunionectomy   implantable loop recorder pacement  01/19/13   MDT Reveal LINQ implanted in [place] for afib management   JOINT REPLACEMENT Left 07/2017   shoulder   KNEE SURGERY Left 2016    arthoscopy    LIGATION / DIVISION SAPHENOUS VEIN Bilateral    POLYPECTOMY     REVERSE SHOULDER ARTHROPLASTY Left 08/02/2017   Procedure: TOTAL SHOULDER ARTHROPLASTY VS. REVERSE;  Surgeon: Corky Mull, MD;  Location: [facility] ORS;  Service: Orthopedics;  Laterality: Left;   TOTAL KNEE ARTHROPLASTY Left 06/08/2018   Procedure: TOTAL KNEE ARTHROPLASTY;  Surgeon: Corky Mull, MD;  Location: [facility] ORS;  Service: Orthopedics;  Laterality: Left;    Family History  Problem Relation Age of Onset   Hypertension Mother    Glaucoma Mother    CAD Father    Atrial fibrillation Father    Gallbladder disease Sister    Cholelithiasis Sister    CAD Brother    Hypertension Brother    Atrial fibrillation Brother    Hypercholesterolemia Brother    Heart disease Son 49  stent placement   Heart attack Son    Colon cancer Neg Hx    Breast cancer Neg Hx     Allergies  Allergen Reactions   Ambien [Zolpidem Tartrate] Other (See Comments)    Flu like symptoms    Compazine [Prochlorperazine Edisylate] Other (See Comments)    Aches and pains, generalized   Metoprolol Other (See Comments)    Depression    Reglan [Metoclopramide] Anxiety    Current Outpatient Medications on File Prior to Visit  Medication Sig Dispense Refill   acetaminophen (TYLENOL) 650 MG CR tablet Take 650 mg by mouth every 8 (eight) hours as needed for pain. Patient taking 2 times per day     aspirin EC 81 MG tablet Take 1 tablet (81 mg total) by mouth daily. Swallow whole. 90 tablet 3   atenolol (TENORMIN) 25 MG tablet Take 1 tablet (25 mg total) by mouth daily. 90 tablet 3   Cholecalciferol (VITAMIN D3) 125 MCG (5000 UT) CAPS Take 1 capsule by mouth daily.      Cyanocobalamin (VITAMIN B-12 PO) Take 1 tablet by mouth daily.     diclofenac sodium (VOLTAREN) 1 % GEL Apply 2 g topically 3 (three) times daily as needed (for knee pain).   1   ezetimibe (ZETIA) 10 MG tablet Take 1 tablet (10 mg total) by mouth daily. 90 tablet 3    famotidine (PEPCID) 20 MG tablet Take 20 mg by mouth at bedtime.      flecainide (TAMBOCOR) 50 MG tablet Take 1 tablet (50 mg total) by mouth 2 (two) times daily. 180 tablet 3   ibuprofen (ADVIL,MOTRIN) 800 MG tablet Take 800 mg by mouth every 8 (eight) hours as needed for headache or moderate pain.      metFORMIN (GLUCOPHAGE-XR) 500 MG 24 hr tablet Take 1 tablet (500 mg total) by mouth daily with breakfast. For diabetes. Office visit required for further refills. 30 tablet 0   Multiple Vitamins-Minerals (PRESERVISION AREDS 2) CAPS Take 1 tablet by mouth daily.      Omega-3 Fatty Acids (FISH OIL) 1200 MG CAPS daily.     pravastatin (PRAVACHOL) 40 MG tablet Take 1 tablet (40 mg total) by mouth every evening. For cholesterol. Office visit required for further refills. 90 tablet 3   triamterene-hydrochlorothiazide (MAXZIDE-25) 37.5-25 MG tablet TAKE 0.5 TABLETS BY MOUTH DAILY AS NEEDED (EDEMA). 45 tablet 3   Turmeric 500 MG TABS Take 500 mg by mouth daily.     Current Facility-Administered Medications on File Prior to Visit  Medication Dose Route Frequency Provider Last Rate Last Admin   0.9 %  sodium chloride infusion  500 mL Intravenous Continuous Milus Banister, MD        BP 132/68    Pulse 72    Temp 97.6 F (36.4 C) (Oral)    Ht 5\' 6"  (1.676 m)    Wt 212 lb (96.2 kg)    SpO2 95%    BMI 34.22 kg/m  Objective:   Physical Exam Cardiovascular:     Rate and Rhythm: Normal rate and regular rhythm.  Pulmonary:     Effort: Pulmonary effort is normal.     Breath sounds: Normal breath sounds.  Abdominal:     General: Bowel sounds are normal.     Palpations: Abdomen is soft.     Tenderness: There is no abdominal tenderness.  Musculoskeletal:     Cervical back: Neck supple.  Skin:    General: Skin is warm and dry.  Psychiatric:        Mood and Affect: Mood normal.          Assessment & Plan:      This visit occurred during the SARS-CoV-2 public health emergency.  Safety  protocols were in place, including screening questions prior to the visit, additional usage of staff PPE, and extensive cleaning of exam room while observing appropriate contact time as indicated for disinfecting solutions.

## 2021-07-01 NOTE — Assessment & Plan Note (Signed)
Reviewed cardiac CTA and cardiology notes from yesterday.  LDL goal of less than 70. Checking lipid panel today.  Continue pravastatin 40 mg daily, start Zetia 10 mg daily.  Asymptomatic.  Continue atenolol 25 mg daily, blood pressure control, diabetes control

## 2021-07-07 LAB — HM DIABETES EYE EXAM

## 2021-07-08 ENCOUNTER — Other Ambulatory Visit: Payer: Self-pay

## 2021-07-08 ENCOUNTER — Ambulatory Visit
Admission: RE | Admit: 2021-07-08 | Discharge: 2021-07-08 | Disposition: A | Discharge status: Home / Self Care | Payer: Medicare Other | Source: Ambulatory Visit | Attending: Primary Care | Admitting: Primary Care

## 2021-07-08 DIAGNOSIS — Z780 Asymptomatic menopausal state: Secondary | ICD-10-CM | POA: Insufficient documentation

## 2021-07-10 ENCOUNTER — Other Ambulatory Visit: Payer: Self-pay | Admitting: Primary Care

## 2021-07-10 DIAGNOSIS — E119 Type 2 diabetes mellitus without complications: Secondary | ICD-10-CM

## 2021-07-10 DIAGNOSIS — I25118 Atherosclerotic heart disease of native coronary artery with other forms of angina pectoris: Secondary | ICD-10-CM

## 2021-07-13 ENCOUNTER — Encounter: Payer: Self-pay | Admitting: Primary Care

## 2021-07-31 ENCOUNTER — Other Ambulatory Visit: Payer: Self-pay | Admitting: Primary Care

## 2021-07-31 DIAGNOSIS — E119 Type 2 diabetes mellitus without complications: Secondary | ICD-10-CM

## 2021-07-31 DIAGNOSIS — I25118 Atherosclerotic heart disease of native coronary artery with other forms of angina pectoris: Secondary | ICD-10-CM

## 2021-08-04 ENCOUNTER — Encounter: Payer: Medicare Other | Admitting: Primary Care

## 2021-08-16 ENCOUNTER — Other Ambulatory Visit: Payer: Self-pay | Admitting: Primary Care

## 2021-08-16 DIAGNOSIS — E119 Type 2 diabetes mellitus without complications: Secondary | ICD-10-CM

## 2021-08-16 DIAGNOSIS — I25118 Atherosclerotic heart disease of native coronary artery with other forms of angina pectoris: Secondary | ICD-10-CM

## 2021-09-12 ENCOUNTER — Other Ambulatory Visit: Payer: Self-pay | Admitting: Primary Care

## 2021-09-12 DIAGNOSIS — E119 Type 2 diabetes mellitus without complications: Secondary | ICD-10-CM

## 2021-09-12 DIAGNOSIS — I25118 Atherosclerotic heart disease of native coronary artery with other forms of angina pectoris: Secondary | ICD-10-CM

## 2021-09-26 ENCOUNTER — Other Ambulatory Visit: Payer: Self-pay | Admitting: Primary Care

## 2021-09-26 DIAGNOSIS — E119 Type 2 diabetes mellitus without complications: Secondary | ICD-10-CM

## 2021-10-16 ENCOUNTER — Encounter: Payer: Self-pay | Admitting: Gastroenterology

## 2022-02-03 ENCOUNTER — Encounter: Payer: Medicare Other | Admitting: Primary Care

## 2022-02-09 ENCOUNTER — Encounter: Payer: Self-pay | Admitting: Primary Care

## 2022-02-09 ENCOUNTER — Ambulatory Visit (INDEPENDENT_AMBULATORY_CARE_PROVIDER_SITE_OTHER): Payer: Medicare Other | Admitting: Primary Care

## 2022-02-09 VITALS — BP 118/68 | HR 65 | Temp 97.2°F | Ht 65.5 in | Wt 201.0 lb

## 2022-02-09 DIAGNOSIS — E1165 Type 2 diabetes mellitus with hyperglycemia: Secondary | ICD-10-CM

## 2022-02-09 DIAGNOSIS — I2510 Atherosclerotic heart disease of native coronary artery without angina pectoris: Secondary | ICD-10-CM

## 2022-02-09 DIAGNOSIS — Z23 Encounter for immunization: Secondary | ICD-10-CM | POA: Diagnosis not present

## 2022-02-09 LAB — POCT GLYCOSYLATED HEMOGLOBIN (HGB A1C): Hemoglobin A1C: 6.2 % — AB (ref 4.0–5.6)

## 2022-02-09 MED ORDER — OZEMPIC (0.25 OR 0.5 MG/DOSE) 2 MG/3ML ~~LOC~~ SOPN: PEN_INJECTOR | SUBCUTANEOUS | 0 refills | Status: DC

## 2022-02-09 NOTE — Progress Notes (Signed)
ref

## 2022-02-09 NOTE — Progress Notes (Signed)
Subjective:    Patient ID: Kelsey Weaver, female    DOB: Mar 16, 1946, 76 y.o.   MRN: [id number]  HPI  Kelsey Weaver is a very pleasant 76 y.o. female with a history of atrial fibrillation, CAD, type 2 diabetes, diabetic neuropathy, osteoarthritis, hyperlipidemia, restless legs who presents today for follow up of diabetes.   She is also due for repeat colonoscopy, she prefers to see Dr. Allen Norris.   Current medications include: Metformin XR 500 mg daily. She is interested in starting Ozempic for weight loss. She is frustrated by her weight, especially with the area of obesity in her mid abdomen. She has been intermittent fasting since February 2023, has increased physical exercise.   She is checking her blood glucose 0 times daily.  Last A1C: 6.6 in February 2023, 6.2 today Last Eye Exam: UTD Last Foot Exam: Due Pneumonia Vaccination: 2016 Urine Microalbumin: UTD Statin: Pravastatin  Dietary changes since last visit: She has been intermittent fasting.   Exercise: Walking 2-3 miles 2-3 times daily.   Wt Readings from Last 3 Encounters:  02/09/22 201 lb (91.2 kg)  07/01/21 212 lb (96.2 kg)  06/30/21 211 lb 6 oz (95.9 kg)       Review of Systems  Respiratory:  Negative for shortness of breath.   Cardiovascular:  Negative for chest pain.  Neurological:  Positive for numbness. Negative for dizziness.         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   Status post total knee replacement  using cement, 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    Social History   Socioeconomic History   Marital status: Married    Spouse name: Not on file   Number of children: 3   Years of education: Not on file   Highest education level: Not on file  Occupational History   Occupation: Retired Therapist, sports  Tobacco Use   Smoking status: Never   Smokeless tobacco: Never  Vaping Use   Vaping Use: Never used  Substance and Sexual Activity   Alcohol use: Yes    Alcohol/week: 0.0 standard drinks of alcohol    Comment: socially   Drug use: No   Sexual activity: Not on file  Other Topics Concern   Not on file  Social History Narrative   Pt recently moved to [place]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    Once worked in L&D and NICU.    Son is a Marine scientist at [employer] and also works for [employer] .   Enjoys spending time with her family.    Social Determinants of Health   Financial Resource Strain: Low Risk  (05/14/2021)   Overall Financial Resource Strain (CARDIA)    Difficulty of Paying Living Expenses: Not hard at all  Food Insecurity: No Food Insecurity (05/14/2021)   Hunger Vital Sign    Worried About Running Out of Food in the Last Year: Never true    Ran Out of Food in the Last  Year: Never true  Transportation Needs: No Transportation Needs (05/14/2021)   PRAPARE - Hydrologist (Medical): No    Lack of Transportation (Non-Medical): No  Physical Activity: Insufficiently Active (05/14/2021)   Exercise Vital Sign    Days of Exercise per Week: 1 day    Minutes of Exercise per Session: 120 min  Stress: No Stress Concern Present (05/14/2021)   [place]    Feeling of Stress : Not at all  Social Connections: [place] (05/14/2021)   Social Connection and Isolation Panel [NHANES]    Frequency of Communication with Friends and Family: More than three times a week    Frequency of Social Gatherings with Friends and Family:  Twice a week    Attends Religious Services: More than 4 times per year    Active Member of [employer] or Organizations: Yes    Attends Archivist Meetings: More than 4 times per year    Marital Status: Married  Human resources officer Violence: Not At Risk (05/14/2021)   Humiliation, Afraid, Rape, and Kick questionnaire    Fear of Current or Ex-Partner: No    Emotionally Abused: No    Physically Abused: No    Sexually Abused: No    Past Surgical History:  Procedure Laterality Date   ABLATION  10/31/2007, 08/26/2008, 09/22/2012, 07/01/2015   AFib ablation x 4 in [place] Bilateral    COLONOSCOPY     CYSTECTOMY     subsebacous x 2   DILATION AND CURETTAGE OF UTERUS  at age 46   EYE SURGERY     FOOT SURGERY Bilateral 2008, 2014   4 hammertoes and bunionectomy   implantable loop recorder pacement  01/19/13   MDT Reveal LINQ implanted in [place] for afib management   JOINT REPLACEMENT Left 07/2017   shoulder   KNEE SURGERY Left 2016   arthoscopy    LIGATION / DIVISION SAPHENOUS VEIN Bilateral    POLYPECTOMY     REVERSE SHOULDER ARTHROPLASTY Left 08/02/2017   Procedure: TOTAL SHOULDER ARTHROPLASTY VS. REVERSE;  Surgeon: Corky Mull, MD;  Location: [facility] ORS;  Service: Orthopedics;  Laterality: Left;   TOTAL KNEE ARTHROPLASTY Left 06/08/2018   Procedure: TOTAL KNEE ARTHROPLASTY;  Surgeon: Corky Mull, MD;  Location: [facility] ORS;  Service: Orthopedics;  Laterality: Left;    Family History  Problem Relation Age of Onset   Hypertension Mother    Glaucoma Mother    CAD Father    Atrial fibrillation Father    Gallbladder disease Sister    Cholelithiasis Sister    CAD Brother    Hypertension Brother    Atrial fibrillation Brother    Hypercholesterolemia Brother    Heart disease Son 71       stent placement   Heart attack Son    Colon cancer Neg Hx    Breast cancer Neg Hx     Allergies  Allergen Reactions   Ambien [Zolpidem Tartrate] Other (See Comments)    Flu like  symptoms    Compazine [Prochlorperazine Edisylate] Other (See Comments)    Aches and pains, generalized   Metoprolol Other (See Comments)    Depression    Reglan [Metoclopramide] Anxiety    Current Outpatient Medications on File Prior to Visit  Medication Sig Dispense Refill   acetaminophen (TYLENOL) 650 MG CR tablet Take 650 mg by mouth every 8 (eight) hours as needed for pain. Patient taking  2 times per day     aspirin EC 81 MG tablet Take 1 tablet (81 mg total) by mouth daily. Swallow whole. 90 tablet 3   atenolol (TENORMIN) 25 MG tablet Take 1 tablet (25 mg total) by mouth daily. 90 tablet 3   Cholecalciferol (VITAMIN D3) 125 MCG (5000 UT) CAPS Take 1 capsule by mouth daily.      Cyanocobalamin (VITAMIN B-12 PO) Take 1 tablet by mouth daily.     diclofenac sodium (VOLTAREN) 1 % GEL Apply 2 g topically 3 (three) times daily as needed (for knee pain).   1   ezetimibe (ZETIA) 10 MG tablet Take 1 tablet (10 mg total) by mouth daily. 90 tablet 3   famotidine (PEPCID) 20 MG tablet Take 20 mg by mouth at bedtime.      flecainide (TAMBOCOR) 50 MG tablet Take 1 tablet (50 mg total) by mouth 2 (two) times daily. 180 tablet 3   ibuprofen (ADVIL,MOTRIN) 800 MG tablet Take 800 mg by mouth every 8 (eight) hours as needed for headache or moderate pain.      metFORMIN (GLUCOPHAGE-XR) 500 MG 24 hr tablet Take 1 tablet (500 mg total) by mouth daily with breakfast. For diabetes. 90 tablet 1   Multiple Vitamins-Minerals (PRESERVISION AREDS 2) CAPS Take 1 tablet by mouth daily.      Omega-3 Fatty Acids (FISH OIL) 1200 MG CAPS daily.     pravastatin (PRAVACHOL) 40 MG tablet Take 1 tablet (40 mg total) by mouth every evening. For cholesterol. Office visit required for further refills. 90 tablet 3   traZODone (DESYREL) 150 MG tablet Take 1 tablet (150 mg total) by mouth at bedtime. For sleep. 90 tablet 3   triamterene-hydrochlorothiazide (MAXZIDE-25) 37.5-25 MG tablet TAKE 0.5 TABLETS BY MOUTH DAILY AS NEEDED  (EDEMA). 45 tablet 3   Turmeric 500 MG TABS Take 500 mg by mouth daily.     Current Facility-Administered Medications on File Prior to Visit  Medication Dose Route Frequency Provider Last Rate Last Admin   0.9 %  sodium chloride infusion  500 mL Intravenous Continuous Milus Banister, MD        BP 118/68   Pulse 65   Temp (!) 97.2 F (36.2 C) (Temporal)   Ht 5' 5.5" (1.664 m)   Wt 201 lb (91.2 kg)   SpO2 99%   BMI 32.94 kg/m  Objective:   Physical Exam Cardiovascular:     Rate and Rhythm: Normal rate and regular rhythm.  Pulmonary:     Effort: Pulmonary effort is normal.     Breath sounds: Normal breath sounds.  Musculoskeletal:     Cervical back: Neck supple.  Skin:    General: Skin is warm and dry.           Assessment & Plan:   Problem List Items Addressed This Visit       Endocrine   Type 2 diabetes mellitus with hyperglycemia ([place]) - Primary    Improved with A1C of 6.2 today!!  Continue metformin XR 500 mg daily. Add Ozempic 0.25 mg x 4 weeks, then 0.5 mg weekly thereafter for weight loss and diabetes.  Discussed to start monitoring glucose readings and to report any episodes of hypoglycemia.   Foot exam today. Managed on statin.  Follow up in 4 months.      Relevant Medications   Semaglutide,0.25 or 0.'5MG'$ /DOS, (OZEMPIC, 0.25 OR 0.5 MG/DOSE,) 2 MG/3ML SOPN   Other Relevant Orders   POCT glycosylated hemoglobin (Hb A1C) (  Completed)       Pleas Koch, NP

## 2022-02-09 NOTE — Assessment & Plan Note (Signed)
Improved with A1C of 6.2 today!!  Continue metformin XR 500 mg daily. Add Ozempic 0.25 mg x 4 weeks, then 0.5 mg weekly thereafter for weight loss and diabetes.  Discussed to start monitoring glucose readings and to report any episodes of hypoglycemia.   Foot exam today. Managed on statin.  Follow up in 4 months.

## 2022-02-09 NOTE — Patient Instructions (Signed)
Start semaglutide (Ozempic) for diabetes and weight loss. Start with 0.25 mg weekly x 4 weeks, then increase to 0.5 mg weekly thereafter.  Continue metformin.  Monitor for low blood sugars as discussed.  You will be contacted regarding your referral to GI for the colonoscopy.  Please let us know if you have not been contacted within two weeks.   Please schedule a follow up visit for February 2024.  It was a pleasure to see you today!

## 2022-02-11 ENCOUNTER — Encounter: Payer: Self-pay | Admitting: Primary Care

## 2022-02-24 ENCOUNTER — Other Ambulatory Visit: Payer: Self-pay | Admitting: Internal Medicine

## 2022-02-24 NOTE — Telephone Encounter (Signed)
This is a Public relations account executive pt, Dr. Rockey Situ

## 2022-03-11 DIAGNOSIS — Z1211 Encounter for screening for malignant neoplasm of colon: Secondary | ICD-10-CM

## 2022-03-15 ENCOUNTER — Other Ambulatory Visit: Payer: Self-pay | Admitting: Primary Care

## 2022-03-15 DIAGNOSIS — M7062 Trochanteric bursitis, left hip: Secondary | ICD-10-CM | POA: Diagnosis not present

## 2022-03-15 DIAGNOSIS — M67952 Unspecified disorder of synovium and tendon, left thigh: Secondary | ICD-10-CM | POA: Diagnosis not present

## 2022-03-15 DIAGNOSIS — E119 Type 2 diabetes mellitus without complications: Secondary | ICD-10-CM

## 2022-04-05 ENCOUNTER — Telehealth: Payer: Self-pay

## 2022-04-05 NOTE — Telephone Encounter (Signed)
Returned patients call to schedule her colonoscopy.  Left voice message for her to call me back.  She has a referral in Epic 03/11/22.  Pt has a history of colon polyps her last colonoscopy was performed with Dr. Ardis Hughs  09/17/16.  Pt has Atrial Fib, and CAD-her cardiologist is Dr. Rockey Situ.  Thanks,  [place], [place]

## 2022-04-06 ENCOUNTER — Other Ambulatory Visit: Payer: Self-pay

## 2022-04-06 ENCOUNTER — Telehealth: Payer: Self-pay | Admitting: Cardiovascular Disease

## 2022-04-06 ENCOUNTER — Telehealth: Payer: Self-pay

## 2022-04-06 DIAGNOSIS — Z86010 Personal history of colonic polyps: Secondary | ICD-10-CM

## 2022-04-06 MED ORDER — PEG 3350-KCL-NA BICARB-NACL 420 G PO SOLR: 4000.0000 mL | Freq: Once | ORAL | 0 refills | Status: AC

## 2022-04-06 MED ORDER — NA SULFATE-K SULFATE-MG SULF 17.5-3.13-1.6 GM/177ML PO SOLN: 1.0000 | Freq: Once | ORAL | 0 refills | Status: DC

## 2022-04-06 NOTE — Addendum Note (Signed)
Addended by: Vanetta Mulders on: 04/06/2022 04:28 PM   Modules accepted: Orders

## 2022-04-06 NOTE — Telephone Encounter (Signed)
Gastroenterology Pre-Procedure Review  Request Date: 05/27/22 Requesting Physician: Dr. Allen Norris  PATIENT REVIEW QUESTIONS: The patient responded to the following health history questions as indicated:    1. Are you having any GI issues? no 2. Do you have a personal history of Polyps? yes (last colonoscopy performed by Dr. Ardis Hughs 2018) 3. Do you have a family history of Colon Cancer or Polyps? no 4. Diabetes Mellitus? yes (pt has been advised to stop Metformin 2 days before procedure.  Stoop Ozempic 7 days prior to colonoscopy) 5. Joint replacements in the past 12 months?no 6. Major health problems in the past 3 months?no 7. Any artificial heart valves, MVP, or defibrillator?no however patient has AFIB, CAD. Cardiac clearance sent to Dr. Donivan Scull office.    MEDICATIONS & ALLERGIES:    Patient reports the following regarding taking any anticoagulation/antiplatelet therapy:   Plavix, Coumadin, Eliquis, Xarelto, Lovenox, Pradaxa, Brilinta, or Effient? no Aspirin? yes ('81mg'$ )  Patient confirms/reports the following medications:  Current Outpatient Medications  Medication Sig Dispense Refill   acetaminophen (TYLENOL) 650 MG CR tablet Take 650 mg by mouth every 8 (eight) hours as needed for pain. Patient taking 2 times per day     aspirin EC 81 MG tablet Take 1 tablet (81 mg total) by mouth daily. Swallow whole. 90 tablet 3   atenolol (TENORMIN) 25 MG tablet Take 1 tablet (25 mg total) by mouth daily. 90 tablet 0   Cholecalciferol (VITAMIN D3) 125 MCG (5000 UT) CAPS Take 1 capsule by mouth daily.      Cyanocobalamin (VITAMIN B-12 PO) Take 1 tablet by mouth daily.     diclofenac sodium (VOLTAREN) 1 % GEL Apply 2 g topically 3 (three) times daily as needed (for knee pain).   1   ezetimibe (ZETIA) 10 MG tablet Take 1 tablet (10 mg total) by mouth daily. 90 tablet 3   famotidine (PEPCID) 20 MG tablet Take 20 mg by mouth at bedtime.      flecainide (TAMBOCOR) 50 MG tablet Take 1 tablet (50 mg total)  by mouth 2 (two) times daily. 180 tablet 3   ibuprofen (ADVIL,MOTRIN) 800 MG tablet Take 800 mg by mouth every 8 (eight) hours as needed for headache or moderate pain.      metFORMIN (GLUCOPHAGE-XR) 500 MG 24 hr tablet TAKE 1 TABLET(500 MG) BY MOUTH DAILY WITH BREAKFAST FOR DIABETES 90 tablet 1   Multiple Vitamins-Minerals (PRESERVISION AREDS 2) CAPS Take 1 tablet by mouth daily.      Omega-3 Fatty Acids (FISH OIL) 1200 MG CAPS daily.     pravastatin (PRAVACHOL) 40 MG tablet Take 1 tablet (40 mg total) by mouth every evening. For cholesterol. Office visit required for further refills. 90 tablet 3   Semaglutide,0.25 or 0.'5MG'$ /DOS, (OZEMPIC, 0.25 OR 0.5 MG/DOSE,) 2 MG/3ML SOPN Inject 0.25 mg into the skin once weekly x 4 weeks, then increase to 0.5 mg weekly thereafter for diabetes. 9 mL 0   traZODone (DESYREL) 150 MG tablet Take 1 tablet (150 mg total) by mouth at bedtime. For sleep. 90 tablet 3   triamterene-hydrochlorothiazide (MAXZIDE-25) 37.5-25 MG tablet TAKE 0.5 TABLETS BY MOUTH DAILY AS NEEDED (EDEMA). 45 tablet 3   Turmeric 500 MG TABS Take 500 mg by mouth daily.     Current Facility-Administered Medications  Medication Dose Route Frequency Provider Last Rate Last Admin   0.9 %  sodium chloride infusion  500 mL Intravenous Continuous Milus Banister, MD        Patient confirms/reports the  following allergies:  Allergies  Allergen Reactions   Ambien [Zolpidem Tartrate] Other (See Comments)    Flu like symptoms    Compazine [Prochlorperazine Edisylate] Other (See Comments)    Aches and pains, generalized   Metoprolol Other (See Comments)    Depression    Reglan [Metoclopramide] Anxiety    No orders of the defined types were placed in this encounter.   AUTHORIZATION INFORMATION Primary Insurance: 1D#: Group #:  Secondary Insurance: 1D#: Group #:  SCHEDULE INFORMATION: Date:  Time: Location:

## 2022-04-06 NOTE — Telephone Encounter (Signed)
   Pre-operative Risk Assessment    Patient Name: Kelsey Weaver  DOB: [birth year] MRN: [id number]      Request for Surgical Clearance    Procedure:   Colonoscopy   Date of Surgery:  Clearance 05/27/21                                 Surgeon:  not indicated Surgeon's Group or Practice Name:  [facility]  Phone number:  [phone] Fax number:  [phone]   Type of Clearance Requested:   - Medical    Type of Anesthesia:  General    Additional requests/questions:    SignedAce Gins   04/06/2022, 3:51 PM

## 2022-04-07 NOTE — Telephone Encounter (Signed)
   Name: Kelsey Weaver  DOB: [birth year]  MRN: [id number]  Primary Cardiologist: None  Chart reviewed as part of pre-operative protocol coverage. Because of Kelsey Weaver's past medical history and time since last visit, she will require a follow-up phone office visit in order to better assess preoperative cardiovascular risk.  Pre-op covering staff: - Please schedule appointment and call patient to inform them. If patient already had an upcoming appointment within acceptable timeframe, please add "pre-op clearance" to the appointment notes so provider is aware. - Please contact requesting surgeon's office via preferred method (i.e, phone, fax) to inform them of need for appointment prior to surgery.  Loel Dubonnet, NP  04/07/2022, 8:58 AM

## 2022-04-08 NOTE — Telephone Encounter (Signed)
Pt is scheduled for annual visit and pre op appt.

## 2022-04-13 NOTE — Progress Notes (Signed)
Cardiology Office Note    Date:  04/13/2022   ID:  Kelsey Weaver, DOB 09-Feb-1946, MRN [id number]  PCP:  Pleas Koch, NP  Cardiologist:  Ida Rogue, MD  Electrophysiologist:  None   Chief Complaint: ***  History of Present Illness:   Kelsey Weaver is a 76 y.o. female with history of CAD, PAF status post prior ablation x 4, DM2, HTN, and HLD who presents for ***  Prior documentation indicates her A-fib dates back to 2002.  She was previously followed by EP with documentation indicating she underwent ablation x 4.  Coronary CTA in 05/2021 showed a calcium score of 1033 which was the 94th percentile.  There was 25 to 49% stenosis in the distal left main, 25 to 49% stenosis in the proximal LAD (possibly as high as 69%), 25 to 49% proximal and mid LCx stenosis, 25 to 49% proximal RCA, and 50 to 69% mid RCA stenoses.  CT FFR was normal throughout the coronary arteries.  Echo in 06/2021 showed an EF of 55 to 60%, no regional wall motion abnormalities, grade 2 diastolic dysfunction, normal RV systolic function with mildly enlarged ventricular cavity size, mildly dilated left atrium, mild mitral regurgitation, and mild aortic insufficiency with aortic valve sclerosis without evidence of stenosis.  She was last seen in our office in 06/2021 to establish care with Dr. Rockey Situ and was without symptoms of angina or decompensation.  She is scheduled to undergo a colonoscopy on 05/27/2021.  ***  [facility] Activity Status Index: *** METs Revised Cardiac Risk Index: ***Risk for noncardiac surgery with an estimated rate of ***% for adverse cardiac event in the perioperative timeframe   Labs independently reviewed: 02/2022 - A1c 6.2 06/2021 - Hgb 15.0, PLT 288, albumin 4.8, AST/ALT normal, TC 138, TG 164, HDL 39, LDL 66 05/2021 - potassium 4.0, BUN 24, serum creatinine 0.67  Past Medical History:  Diagnosis Date   Cardiac arrhythmia due to congenital heart disease    COVID-19 virus infection  02/20/2021   Diabetes mellitus without complication (HCC)    Diverticulosis    DJD (degenerative joint disease)    frozen shoulder   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   GERD (gastroesophageal reflux disease)    History of colonic polyps    Hyperlipemia    Hypertension    Overweight    Paroxysmal atrial fibrillation ([place]) 09/2000   Status post reverse total shoulder replacement, 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   Status post total knee replacement using cement, 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    Past Surgical History:  Procedure Laterality Date   ABLATION  10/31/2007, 08/26/2008, 09/22/2012, 07/01/2015   AFib ablation x 4 in [place]   CATARACT EXTRACTION Bilateral    COLONOSCOPY     CYSTECTOMY     subsebacous x 2   DILATION AND CURETTAGE OF UTERUS  at age 83   EYE SURGERY     FOOT SURGERY Bilateral 2008, 2014   4 hammertoes and bunionectomy   implantable loop recorder pacement  01/19/13   MDT Reveal LINQ implanted in [place] for afib management   JOINT REPLACEMENT Left 07/2017   shoulder   KNEE SURGERY Left 2016   arthoscopy    LIGATION / DIVISION SAPHENOUS VEIN Bilateral    POLYPECTOMY     REVERSE SHOULDER ARTHROPLASTY Left 08/02/2017   Procedure: TOTAL SHOULDER ARTHROPLASTY VS. REVERSE;  Surgeon: Corky Mull, MD;  Location: [facility] ORS;  Service: Orthopedics;  Laterality: Left;   TOTAL KNEE ARTHROPLASTY Left 06/08/2018  Procedure: TOTAL KNEE ARTHROPLASTY;  Surgeon: Corky Mull, MD;  Location: [facility] ORS;  Service: Orthopedics;  Laterality: Left;    Current Medications: No outpatient medications have been marked as taking for the 04/20/22 encounter (Appointment) with Rise Mu, PA-C.   Current Facility-Administered Medications for the 04/20/22 encounter (Appointment) with Rise Mu, PA-C  Medication   0.9 %  sodium chloride infusion    Allergies:   Ambien [zolpidem tartrate], Compazine [prochlorperazine edisylate], Metoprolol, and Reglan [metoclopramide]   Social History   Socioeconomic  History   Marital status: Married    Spouse name: Not on file   Number of children: 3   Years of education: Not on file   Highest education level: Not on file  Occupational History   Occupation: Retired Therapist, sports  Tobacco Use   Smoking status: Never   Smokeless tobacco: Never  Vaping Use   Vaping Use: Never used  Substance and Sexual Activity   Alcohol use: Yes    Alcohol/week: 0.0 standard drinks of alcohol    Comment: socially   Drug use: No   Sexual activity: Not on file  Other Topics Concern   Not on file  Social History Narrative   Pt recently moved to [place]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    Once worked in L&D and NICU.    Son is a Marine scientist at [employer] and also works for [employer] .   Enjoys spending time with her family.    Social Determinants of Health   Financial Resource Strain: Low Risk  (05/14/2021)   Overall Financial Resource Strain (CARDIA)    Difficulty of Paying Living Expenses: Not hard at all  Food Insecurity: No Food Insecurity (05/14/2021)   Hunger Vital Sign    Worried About Running Out of Food in the Last Year: Never true    Ran Out of Food in the Last Year: Never true  Transportation Needs: No Transportation Needs (05/14/2021)   PRAPARE - Hydrologist (Medical): No    Lack of Transportation (Non-Medical): No  Physical Activity: Insufficiently Active (05/14/2021)   Exercise Vital Sign    Days of Exercise per Week: 1 day    Minutes of Exercise per Session: 120 min  Stress: No Stress Concern Present (05/14/2021)   [place]    Feeling of Stress : Not at all  Social Connections: [place] (05/14/2021)   Social Connection and Isolation Panel [NHANES]    Frequency of Communication with Friends and Family: More than three times a week    Frequency of Social Gatherings with Friends and Family: Twice a week    Attends Religious Services: More than 4 times per  year    Active Member of [employer] or Organizations: Yes    Attends Music therapist: More than 4 times per year    Marital Status: Married     Family History:  The patient's family history includes Atrial fibrillation in her brother and father; CAD in her brother and father; Cholelithiasis in her sister; Gallbladder disease in her sister; Glaucoma in her mother; Heart attack in her son; Heart disease (age of onset: 31) in her son; Hypercholesterolemia in her brother; Hypertension in her brother and mother. There is no history of Colon cancer or Breast cancer.  ROS:   12-point review of systems is negative unless otherwise noted in the HPI.   EKGs/Labs/Other Studies Reviewed:  Studies reviewed were summarized above. The additional studies were reviewed today:  2D echo 06/10/2021: 1. Left ventricular ejection fraction, by estimation, is 55 to 60%. The  left ventricle has normal function. The left ventricle has no regional  wall motion abnormalities. Left ventricular diastolic parameters are  consistent with Grade II diastolic  dysfunction (pseudonormalization).   2. Right ventricular systolic function is normal. The right ventricular  size is mildly enlarged.   3. Left atrial size was mildly dilated.   4. The mitral valve is degenerative. Mild mitral valve regurgitation.   5. The aortic valve is tricuspid. Aortic valve regurgitation is mild.  Aortic valve sclerosis is present, with no evidence of aortic valve  stenosis.  __________  Coronary CTA 05/29/2021: Coronary Arteries:  Normal coronary origin.  Right dominance.   Left main: The left main is a large caliber vessel with a normal take off from the left coronary cusp that bifurcates to form a left anterior descending artery and a left circumflex artery. There is mild calcified plaque in the distal LM with associated stenosis of 25-49%.   Left anterior descending artery: The LAD gives off 2 patent diagonal branches.  There is at least mild and possibly moderate calcified plaque throughout the proximal and mid vessel with associated stenosis of at least 25-49% and possibly as high as 69%. There is significant blooming artifact.   Left circumflex artery: The LCX is non-dominant with mild calcified plaque in the proximal and mid vessel with 25-49% stenosis. The LCX gives off 2 patent obtuse marginal branches.   Right coronary artery: The RCA is dominant with normal take off from the right coronary cusp. There is mild calcified plaque in the proximal RCA with associated stenosis of 25-49% followed by moderate calcified plaque in the mid RCA with associated stenosis of 50-69% but could be as high as 70%. There is significant blooming artifact. The RCA terminates as a PDA and right posterolateral branch without evidence of plaque or stenosis.   Right Atrium: Right atrial size is within normal limits.   Right Ventricle: The right ventricular cavity is within normal limits.   Left Atrium: Left atrial size is normal in size with no left atrial appendage filling defect.   Left Ventricle: The ventricular cavity size is within normal limits. There are no stigmata of prior infarction. There is no abnormal filling defect.   Pulmonary arteries: Normal in size without proximal filling defect.   Pulmonary veins: Normal pulmonary venous drainage.   Pericardium: Normal thickness with no significant effusion or calcium present.   Cardiac valves: The aortic valve is trileaflet without significant calcification. The mitral valve is normal structure with mild mitral annular calcification.   Aorta: Normal caliber with mild atherosclerosis in the descending aorta.   Extra-cardiac findings: See attached radiology report for non-cardiac structures.   IMPRESSION: 1. Coronary calcium score of 1033. This was 94th percentile for age-, sex, and race-matched controls. 2.  Normal coronary origin with right  dominance. 3. Moderate atherosclerosis with heavily calcified vessels. CAD RADS  4. Consider symptom-guided anti-ischemic and preventive pharmacotherapy as well as risk factor modification per guideline-directed care. 5.  This study has been submitted for FFR analysis.  ctFFR -  1. Left Main: No significant stenosis.  LM FFR = 1.00. 2. LAD: No significant stenosis. Proximal FFR = 0.98, Mid FFR = 0.91, Distal FFR = 0.82. 3. LCX: No significant stenosis. Proximal FFR = 0.99, Mid FFR - 0.97, Distal FFR =0.89. 4. RCA: No significant  stenosis. Proximal FFR = 0.99, Mid FFR = 0.92. The distal RCA and PDA were not modeled. IMPRESSION: 1. Coronary CTA FFR flow analysis demonstrates no hemodynamically flow limiting lesions.    EKG:  EKG is ordered today.  The EKG ordered today demonstrates ***  Recent Labs: 05/26/2021: BUN 24; Creatinine, Ser 0.67; Potassium 4.0; Sodium 136 07/01/2021: ALT 35; Hemoglobin 15.0; Platelets 288.0  Recent Lipid Panel    Component Value Date/Time   CHOL 138 07/01/2021 1051   TRIG 164.0 (H) 07/01/2021 1051   HDL 39.10 07/01/2021 1051   CHOLHDL 4 07/01/2021 1051   VLDL 32.8 07/01/2021 1051   LDLCALC 66 07/01/2021 1051   LDLDIRECT 107.0 02/21/2019 0949    PHYSICAL EXAM:    VS:  There were no vitals taken for this visit.  BMI: There is no height or weight on file to calculate BMI.  Physical Exam  Wt Readings from Last 3 Encounters:  02/09/22 201 lb (91.2 kg)  07/01/21 212 lb (96.2 kg)  06/30/21 211 lb 6 oz (95.9 kg)     ASSESSMENT & PLAN:   Preprocedure cardiac risk stratification: She is scheduled to undergo a colonoscopy on 05/27/2021.  Per [facility] Activity Status Index, she is able to achieve*** METs without cardiac limitation.  Per Revised Cardiac Risk Index, she is ***risk for noncardiac surgery with an estimated rate of ***% for adverse cardiac event in the periprocedural timeframe.  CAD:  PAF: ***.  CHA2DS2-VASc at least 5 (HTN, age x 2, vascular  disease, sex category).  HTN: Blood pressure  HLD: LDL 66.   {Are you ordering a CV Procedure (e.g. stress test, cath, DCCV, TEE, etc)?   Press F2        :[id number]}     Disposition: F/u with Dr. Rockey Situ or an APP in ***.   Medication Adjustments/Labs and Tests Ordered: Current medicines are reviewed at length with the patient today.  Concerns regarding medicines are outlined above. Medication changes, Labs and Tests ordered today are summarized above and listed in the Patient Instructions accessible in Encounters.   Signed, Christell Faith, PA-C 04/13/2022 8:59 AM     [address] [place], [place] [address] [phone]

## 2022-04-15 DIAGNOSIS — E119 Type 2 diabetes mellitus without complications: Secondary | ICD-10-CM | POA: Diagnosis not present

## 2022-04-15 DIAGNOSIS — H524 Presbyopia: Secondary | ICD-10-CM | POA: Diagnosis not present

## 2022-04-15 DIAGNOSIS — Z961 Presence of intraocular lens: Secondary | ICD-10-CM | POA: Diagnosis not present

## 2022-04-15 LAB — HM DIABETES EYE EXAM

## 2022-04-16 ENCOUNTER — Telehealth: Payer: Self-pay

## 2022-04-16 NOTE — Telephone Encounter (Signed)
Patient lvm yesterday to inquire if I could contact [employer] to see if they will cover SuPrep bowel prep kit.  She does not yet have this insurance plan but next year her insurance changes. Call returned this morning.  I informed her that I would not be able to do this for her, however some insurance companies provide online customer chats and she should check the company's website to see if they can help answer this question.  I've asked her to call me back if we need to change prep from [facility] to Suprep again.  Thanks,  [place], [place]

## 2022-04-19 ENCOUNTER — Encounter: Payer: Self-pay | Admitting: Primary Care

## 2022-04-20 ENCOUNTER — Encounter: Payer: Self-pay | Admitting: Physician Assistant

## 2022-04-20 ENCOUNTER — Ambulatory Visit: Payer: Medicare Other | Attending: Physician Assistant | Admitting: Physician Assistant

## 2022-04-20 VITALS — BP 122/68 | HR 82 | Ht 66.0 in | Wt 197.4 lb

## 2022-04-20 DIAGNOSIS — I2510 Atherosclerotic heart disease of native coronary artery without angina pectoris: Secondary | ICD-10-CM | POA: Diagnosis not present

## 2022-04-20 DIAGNOSIS — I480 Paroxysmal atrial fibrillation: Secondary | ICD-10-CM

## 2022-04-20 DIAGNOSIS — I25118 Atherosclerotic heart disease of native coronary artery with other forms of angina pectoris: Secondary | ICD-10-CM | POA: Insufficient documentation

## 2022-04-20 DIAGNOSIS — E785 Hyperlipidemia, unspecified: Secondary | ICD-10-CM | POA: Insufficient documentation

## 2022-04-20 DIAGNOSIS — I10 Essential (primary) hypertension: Secondary | ICD-10-CM | POA: Insufficient documentation

## 2022-04-20 DIAGNOSIS — Z01810 Encounter for preprocedural cardiovascular examination: Secondary | ICD-10-CM | POA: Insufficient documentation

## 2022-04-20 NOTE — Patient Instructions (Signed)
Medication Instructions:  No changes at this time.   *If you need a refill on your cardiac medications before your next appointment, please call your pharmacy*   Lab Work: None  If you have labs (blood work) drawn today and your tests are completely normal, you will receive your results only by: MyChart Message (if you have MyChart) OR A paper copy in the mail If you have any lab test that is abnormal or we need to change your treatment, we will call you to review the results.   Testing/Procedures: None  Follow-Up: At [facility], you and your health needs are our priority.  As part of our continuing mission to provide you with exceptional heart care, we have created designated Provider Care Teams.  These Care Teams include your primary Cardiologist (physician) and Advanced Practice Providers (APPs -  Physician Assistants and Nurse Practitioners) who all work together to provide you with the care you need, when you need it.   Your next appointment:   1 year(s)  The format for your next appointment:   In Person  Provider:   Timothy Gollan, MD or Ryan Dunn, PA-C        Important Information About Sugar       

## 2022-04-21 ENCOUNTER — Telehealth: Payer: Self-pay

## 2022-04-21 NOTE — Telephone Encounter (Signed)
Cardiac clearance has been granted by Marcille Blanco for patients scheduled colonoscopy 05/27/2021.  Cardiology note is in chart note dated 04/21/23.  Thank you,  Sharyn Lull, CMA

## 2022-04-28 DIAGNOSIS — E1165 Type 2 diabetes mellitus with hyperglycemia: Secondary | ICD-10-CM

## 2022-04-28 MED ORDER — OZEMPIC (0.25 OR 0.5 MG/DOSE) 2 MG/3ML ~~LOC~~ SOPN: 0.5000 mg | PEN_INJECTOR | SUBCUTANEOUS | 0 refills | Status: DC

## 2022-05-13 ENCOUNTER — Other Ambulatory Visit: Payer: Self-pay | Admitting: Primary Care

## 2022-05-13 DIAGNOSIS — Z1231 Encounter for screening mammogram for malignant neoplasm of breast: Secondary | ICD-10-CM

## 2022-05-19 ENCOUNTER — Ambulatory Visit (INDEPENDENT_AMBULATORY_CARE_PROVIDER_SITE_OTHER): Payer: Medicare Other

## 2022-05-19 VITALS — Wt 187.0 lb

## 2022-05-19 DIAGNOSIS — Z0000 Encounter for general adult medical examination without abnormal findings: Secondary | ICD-10-CM | POA: Diagnosis not present

## 2022-05-19 NOTE — Patient Instructions (Signed)
Ms. Giammona , Thank you for taking time to come for your Medicare Wellness Visit. I appreciate your ongoing commitment to your health goals. Please review the following plan we discussed and let me know if I can assist you in the future.   These are the goals we discussed:  Goals      Increase physical activity     Starting 02/15/2018, I will continue to walk 2-3 miles daily.      Patient Stated     02/21/2019, I will start increasing my exercise regimen daily.      Patient Stated     04/01/2020, I will continue to walk 1 mile 2 days a week.      Patient Stated     Would like to lose weight     Patient Stated     Continue losing weight         This is a list of the screening recommended for you and due dates:  Health Maintenance  Topic Date Due   DTaP/Tdap/Td vaccine (2 - Td or Tdap) 05/10/2020   Colon Cancer Screening  09/17/2021   COVID-19 Vaccine (5 - 2023-24 season) 01/08/2022   Yearly kidney function blood test for diabetes  05/26/2022   Yearly kidney health urinalysis for diabetes  07/01/2022   Hemoglobin A1C  08/11/2022   Complete foot exam   02/10/2023   Eye exam for diabetics  04/16/2023   Medicare Annual Wellness Visit  05/20/2023   Pneumonia Vaccine  Completed   Flu Shot  Completed   DEXA scan (bone density measurement)  Completed   Hepatitis C Screening: USPSTF Recommendation to screen - Ages 70-79 yo.  Completed   Zoster (Shingles) Vaccine  Completed   HPV Vaccine  Aged Out    Advanced directives: Please bring a copy of your health care power of attorney and living will to the office at your convenience.  Conditions/risks identified: continue to lose weight   Next appointment: Follow up in one year for your annual wellness visit    Preventive Care 65 Years and Older, Female Preventive care refers to lifestyle choices and visits with your health care provider that can promote health and wellness. What does preventive care include? A yearly physical  exam. This is also called an annual well check. Dental exams once or twice a year. Routine eye exams. Ask your health care provider how often you should have your eyes checked. Personal lifestyle choices, including: Daily care of your teeth and gums. Regular physical activity. Eating a healthy diet. Avoiding tobacco and drug use. Limiting alcohol use. Practicing safe sex. Taking low-dose aspirin every day. Taking vitamin and mineral supplements as recommended by your health care provider. What happens during an annual well check? The services and screenings done by your health care provider during your annual well check will depend on your age, overall health, lifestyle risk factors, and family history of disease. Counseling  Your health care provider may ask you questions about your: Alcohol use. Tobacco use. Drug use. Emotional well-being. Home and relationship well-being. Sexual activity. Eating habits. History of falls. Memory and ability to understand (cognition). Work and work Statistician. Reproductive health. Screening  You may have the following tests or measurements: Height, weight, and BMI. Blood pressure. Lipid and cholesterol levels. These may be checked every 5 years, or more frequently if you are over 48 years old. Skin check. Lung cancer screening. You may have this screening every year starting at age 62 if you have  a 30-pack-year history of smoking and currently smoke or have quit within the past 15 years. Fecal occult blood test (FOBT) of the stool. You may have this test every year starting at age 21. Flexible sigmoidoscopy or colonoscopy. You may have a sigmoidoscopy every 5 years or a colonoscopy every 10 years starting at age 47. Hepatitis C blood test. Hepatitis B blood test. Sexually transmitted disease (STD) testing. Diabetes screening. This is done by checking your blood sugar (glucose) after you have not eaten for a while (fasting). You may have this  done every 1-3 years. Bone density scan. This is done to screen for osteoporosis. You may have this done starting at age 23. Mammogram. This may be done every 1-2 years. Talk to your health care provider about how often you should have regular mammograms. Talk with your health care provider about your test results, treatment options, and if necessary, the need for more tests. Vaccines  Your health care provider may recommend certain vaccines, such as: Influenza vaccine. This is recommended every year. Tetanus, diphtheria, and acellular pertussis (Tdap, Td) vaccine. You may need a Td booster every 10 years. Zoster vaccine. You may need this after age 47. Pneumococcal 13-valent conjugate (PCV13) vaccine. One dose is recommended after age 63. Pneumococcal polysaccharide (PPSV23) vaccine. One dose is recommended after age 23. Talk to your health care provider about which screenings and vaccines you need and how often you need them. This information is not intended to replace advice given to you by your health care provider. Make sure you discuss any questions you have with your health care provider. Document Released: 05/23/2015 Document Revised: 01/14/2016 Document Reviewed: 02/25/2015 Elsevier Interactive Patient Education  2017 [place] Prevention in the Home Falls can cause injuries. They can happen to people of all ages. There are many things you can do to make your home safe and to help prevent falls. What can I do on the outside of my home? Regularly fix the edges of walkways and driveways and fix any cracks. Remove anything that might make you trip as you walk through a door, such as a raised step or threshold. Trim any bushes or trees on the path to your home. Use bright outdoor lighting. Clear any walking paths of anything that might make someone trip, such as rocks or tools. Regularly check to see if handrails are loose or broken. Make sure that both sides of any steps have  handrails. Any raised decks and porches should have guardrails on the edges. Have any leaves, snow, or ice cleared regularly. Use sand or salt on walking paths during winter. Clean up any spills in your garage right away. This includes oil or grease spills. What can I do in the bathroom? Use night lights. Install grab bars by the toilet and in the tub and shower. Do not use towel bars as grab bars. Use non-skid mats or decals in the tub or shower. If you need to sit down in the shower, use a plastic, non-slip stool. Keep the floor dry. Clean up any water that spills on the floor as soon as it happens. Remove soap buildup in the tub or shower regularly. Attach bath mats securely with double-sided non-slip rug tape. Do not have throw rugs and other things on the floor that can make you trip. What can I do in the bedroom? Use night lights. Make sure that you have a light by your bed that is easy to reach. Do not use any  sheets or blankets that are too big for your bed. They should not hang down onto the floor. Have a firm chair that has side arms. You can use this for support while you get dressed. Do not have throw rugs and other things on the floor that can make you trip. What can I do in the kitchen? Clean up any spills right away. Avoid walking on wet floors. Keep items that you use a lot in easy-to-reach places. If you need to reach something above you, use a strong step stool that has a grab bar. Keep electrical cords out of the way. Do not use floor polish or wax that makes floors slippery. If you must use wax, use non-skid floor wax. Do not have throw rugs and other things on the floor that can make you trip. What can I do with my stairs? Do not leave any items on the stairs. Make sure that there are handrails on both sides of the stairs and use them. Fix handrails that are broken or loose. Make sure that handrails are as long as the stairways. Check any carpeting to make sure  that it is firmly attached to the stairs. Fix any carpet that is loose or worn. Avoid having throw rugs at the top or bottom of the stairs. If you do have throw rugs, attach them to the floor with carpet tape. Make sure that you have a light switch at the top of the stairs and the bottom of the stairs. If you do not have them, ask someone to add them for you. What else can I do to help prevent falls? Wear shoes that: Do not have high heels. Have rubber bottoms. Are comfortable and fit you well. Are closed at the toe. Do not wear sandals. If you use a stepladder: Make sure that it is fully opened. Do not climb a closed stepladder. Make sure that both sides of the stepladder are locked into place. Ask someone to hold it for you, if possible. Clearly mark and make sure that you can see: Any grab bars or handrails. First and last steps. Where the edge of each step is. Use tools that help you move around (mobility aids) if they are needed. These include: Canes. Walkers. Scooters. Crutches. Turn on the lights when you go into a dark area. Replace any light bulbs as soon as they burn out. Set up your furniture so you have a clear path. Avoid moving your furniture around. If any of your floors are uneven, fix them. If there are any pets around you, be aware of where they are. Review your medicines with your doctor. Some medicines can make you feel dizzy. This can increase your chance of falling. Ask your doctor what other things that you can do to help prevent falls. This information is not intended to replace advice given to you by your health care provider. Make sure you discuss any questions you have with your health care provider. Document Released: 02/20/2009 Document Revised: 10/02/2015 Document Reviewed: 05/31/2014 Elsevier Interactive Patient Education  2017 [employer].

## 2022-05-19 NOTE — Progress Notes (Signed)
I connected with  Kelsey Weaver on 05/19/22 by a audio enabled telemedicine application and verified that I am speaking with the correct person using two identifiers.  Patient Location: Home  Provider Location: Home Office  I discussed the limitations of evaluation and management by telemedicine. The patient expressed understanding and agreed to proceed.   Subjective:   Kelsey Weaver is a 77 y.o. female who presents for Medicare Annual (Subsequent) preventive examination.  Review of Systems     Cardiac Risk Factors include: advanced age (>77mn, >>29women);diabetes mellitus;dyslipidemia;obesity (BMI >30kg/m2)     Objective:    Today's Vitals   05/19/22 1121  Weight: 187 lb (84.8 kg)   Body mass index is 30.18 kg/m.     05/19/2022   11:24 AM 05/14/2021   11:21 AM 04/01/2020    8:24 AM 03/04/2020   11:07 AM 02/21/2019   12:08 PM 06/08/2018   11:53 AM 05/24/2018   12:55 PM  Advanced Directives  Does Patient Have a Medical Advance Directive? Yes No No No No No No  Type of AParamedicof AClydeLiving will        Does patient want to make changes to medical advance directive?  Yes (MAU/Ambulatory/Procedural Areas - Information given)       Copy of [place] Chart? No - copy requested        Would patient like information on creating a medical advance directive?   No - Patient declined  No - Patient declined No - Patient declined Yes (MAU/Ambulatory/Procedural Areas - Information given)    Current Medications (verified) Outpatient Encounter Medications as of 05/19/2022  Medication Sig   acetaminophen (TYLENOL) 650 MG CR tablet Take 650 mg by mouth every 8 (eight) hours as needed for pain. Patient taking 2 times per day   aspirin EC 81 MG tablet Take 1 tablet (81 mg total) by mouth daily. Swallow whole.   atenolol (TENORMIN) 25 MG tablet Take 1 tablet (25 mg total) by mouth daily.   Cholecalciferol (VITAMIN D3) 125 MCG (5000 UT) CAPS  Take 1 capsule by mouth daily.    Cyanocobalamin (VITAMIN B-12 PO) Take 1 tablet by mouth daily.   diclofenac sodium (VOLTAREN) 1 % GEL Apply 2 g topically 3 (three) times daily as needed (for knee pain).    ezetimibe (ZETIA) 10 MG tablet Take 1 tablet (10 mg total) by mouth daily.   famotidine (PEPCID) 20 MG tablet Take 20 mg by mouth at bedtime.    flecainide (TAMBOCOR) 50 MG tablet Take 1 tablet (50 mg total) by mouth 2 (two) times daily.   ibuprofen (ADVIL,MOTRIN) 800 MG tablet Take 800 mg by mouth every 8 (eight) hours as needed for headache or moderate pain.    metFORMIN (GLUCOPHAGE-XR) 500 MG 24 hr tablet TAKE 1 TABLET(500 MG) BY MOUTH DAILY WITH BREAKFAST FOR DIABETES   Multiple Vitamins-Minerals (PRESERVISION AREDS 2) CAPS Take 1 tablet by mouth daily.    Omega-3 Fatty Acids (FISH OIL) 1200 MG CAPS daily.   pravastatin (PRAVACHOL) 40 MG tablet Take 1 tablet (40 mg total) by mouth every evening. For cholesterol. Office visit required for further refills.   Semaglutide,0.25 or 0.'5MG'$ /DOS, (OZEMPIC, 0.25 OR 0.5 MG/DOSE,) 2 MG/3ML SOPN Inject 0.5 mg into the skin once a week. for diabetes.   traZODone (DESYREL) 150 MG tablet Take 1 tablet (150 mg total) by mouth at bedtime. For sleep.   triamterene-hydrochlorothiazide (MAXZIDE-25) 37.5-25 MG tablet TAKE 0.5 TABLETS BY MOUTH DAILY  AS NEEDED (EDEMA).   Turmeric 500 MG TABS Take 500 mg by mouth daily.   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   GERD (gastroesophageal reflux disease)    History of colonic polyps    Hyperlipemia    Hypertension    Overweight    Paroxysmal atrial fibrillation ([place]) 09/2000   Status post reverse total shoulder replacement, 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   Status post total knee replacement using cement, 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   Past Surgical History:  Procedure Laterality Date   ABLATION  10/31/2007, 08/26/2008, 09/22/2012, 07/01/2015   AFib ablation x 4 in [place]   CATARACT EXTRACTION Bilateral    COLONOSCOPY     CYSTECTOMY     subsebacous x 2   DILATION AND CURETTAGE OF UTERUS  at age 20   EYE SURGERY     FOOT SURGERY Bilateral 2008, 2014   4 hammertoes and bunionectomy   implantable loop recorder pacement  01/19/13   MDT Reveal LINQ implanted in [place] for afib management   JOINT REPLACEMENT Left 07/2017   shoulder   KNEE SURGERY Left 2016   arthoscopy    LIGATION / DIVISION SAPHENOUS VEIN Bilateral    POLYPECTOMY     REVERSE SHOULDER ARTHROPLASTY Left 08/02/2017   Procedure: TOTAL SHOULDER ARTHROPLASTY VS. REVERSE;  Surgeon: Corky Mull, MD;  Location: [facility] ORS;  Service: Orthopedics;  Laterality: Left;   TOTAL KNEE ARTHROPLASTY Left 06/08/2018   Procedure: TOTAL KNEE ARTHROPLASTY;  Surgeon: Corky Mull, MD;  Location: [facility] ORS;  Service: Orthopedics;  Laterality: Left;   Family History  Problem Relation Age of Onset   Hypertension Mother    Glaucoma Mother    CAD Father    Atrial fibrillation Father    Gallbladder disease Sister    Cholelithiasis Sister    CAD Brother    Hypertension Brother    Atrial fibrillation Brother    Hypercholesterolemia Brother    Heart disease Son 7       stent placement   Heart attack Son    Colon cancer Neg Hx    Breast cancer Neg Hx    Social History   Socioeconomic History   Marital status: Married    Spouse name: Not on file   Number of children: 3   Years of education: Not on file   Highest education level: Not on file  Occupational History    Occupation: Retired Therapist, sports  Tobacco Use   Smoking status: Never   Smokeless tobacco: Never  Vaping Use   Vaping Use: Never used  Substance and Sexual Activity   Alcohol use: Yes    Alcohol/week: 0.0 standard drinks of alcohol    Comment: socially   Drug use: No   Sexual activity: Not on file  Other Topics Concern   Not on file  Social History Narrative   Pt recently moved to [place]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    Once worked in L&D and NICU.  Son is a Marine scientist at [employer] and also works for [employer] .   Enjoys spending time with her family.    Social Determinants of Health   Financial Resource Strain: Low Risk  (05/19/2022)   Overall Financial Resource Strain (CARDIA)    Difficulty of Paying Living Expenses: Not hard at all  Food Insecurity: No Food Insecurity (05/19/2022)   Hunger Vital Sign    Worried About Running Out of Food in the Last Year: Never true    Ran Out of Food in the Last Year: Never true  Transportation Needs: No Transportation Needs (05/19/2022)   PRAPARE - Hydrologist (Medical): No    Lack of Transportation (Non-Medical): No  Physical Activity: Insufficiently Active (05/19/2022)   Exercise Vital Sign    Days of Exercise per Week: 2 days    Minutes of Exercise per Session: 60 min  Stress: No Stress Concern Present (05/19/2022)   [place]    Feeling of Stress : Not at all  Social Connections: [place] (05/14/2021)   Social Connection and Isolation Panel [NHANES]    Frequency of Communication with Friends and Family: More than three times a week    Frequency of Social Gatherings with Friends and Family: Twice a week    Attends Religious Services: More than 4 times per year    Active Member of [employer] or Organizations: Yes    Attends Music therapist: More than 4 times per year    Marital Status: Married    Tobacco  Counseling Counseling given: Not Answered   Clinical Intake:  Pre-visit preparation completed: Yes  Pain : No/denies pain     BMI - recorded: 30.18 Nutritional Status: BMI > 30  Obese Nutritional Risks: None Diabetes: Yes CBG done?: No Did pt. bring in CBG monitor from home?: No  How often do you need to have someone help you when you read instructions, pamphlets, or other written materials from your doctor or pharmacy?: 1 - Never  Diabetic?no  Interpreter Needed?: No  Information entered by :: Charlott Rakes, LPN   Activities of Daily Living    05/18/2022    7:47 PM 07/01/2021    9:58 AM  In your present state of health, do you have any difficulty performing the following activities:  Hearing? 0 0  Vision? 0 0  Difficulty concentrating or making decisions? 0 0  Walking or climbing stairs? 0 0  Dressing or bathing? 0 0  Doing errands, shopping? 0 0  Preparing Food and eating ? N   Using the Toilet? N   In the past six months, have you accidently leaked urine? Y   Comment at times   Do you have problems with loss of bowel control? N   Managing your Medications? N   Managing your Finances? N   Housekeeping or managing your Housekeeping? N     Patient Care Team: Pleas Koch, NP as PCP - General (Internal Medicine) Minna Merritts, MD as PCP - Cardiology (Cardiology) Berenice Primas, MD as Referring Physician (Orthopedic Surgery) Bryson Ha, OD (Optometry)  Indicate any recent Medical Services you may have received from other than [facility] providers in the past year (date may be approximate).     Assessment:   This is a routine wellness examination for [facility].  Hearing/Vision screen Hearing Screening - Comments:: Pt denies any hearing  Vision Screening - Comments:: Pt follows up with Dr Kerin Ransom for  annual eye exams   Dietary issues and exercise activities discussed: Current Exercise Habits: Home exercise routine, Type of exercise: walking, Time  (Minutes): 60, Frequency (Times/Week): 2, Weekly Exercise (Minutes/Week): 120   Goals Addressed             This Visit's Progress    Patient Stated       Continue losing weight        Depression Screen    05/19/2022   11:26 AM 05/14/2021   11:25 AM 04/01/2020    8:25 AM 03/04/2020   11:03 AM 02/21/2019   12:09 PM 02/15/2018   12:35 PM 12/03/2016   10:10 AM  PHQ 2/9 Scores  PHQ - 2 Score 0 0 0 0 0 0 0  PHQ- 9 Score   0 0 0 0     Fall Risk    05/19/2022   11:27 AM 05/18/2022    7:47 PM 05/14/2021   11:24 AM 04/01/2020    8:24 AM 03/04/2020   11:03 AM  Fall Risk   Falls in the past year? 0 0 0 0 0  Number falls in past yr: 0  0 0 0  Injury with Fall? 0 0 0 0 0  Risk for fall due to : Impaired vision  No Fall Risks Medication side effect   Follow up Falls prevention discussed  Falls prevention discussed Falls evaluation completed;Falls prevention discussed     FALL RISK PREVENTION PERTAINING TO THE HOME:  Any stairs in or around the home? Yes  If so, are there any without handrails? No  Home free of loose throw rugs in walkways, pet beds, electrical cords, etc? Yes  Adequate lighting in your home to reduce risk of falls? Yes   ASSISTIVE DEVICES UTILIZED TO PREVENT FALLS:  Life alert? No  Use of a cane, walker or w/c? No  Grab bars in the bathroom? Yes  Shower chair or bench in shower? No  Elevated toilet seat or a handicapped toilet? No   TIMED UP AND GO:  Was the test performed? No .   Cognitive Function:    04/01/2020    8:30 AM 02/21/2019   12:12 PM 02/15/2018   12:35 PM 12/03/2016   10:16 AM  MMSE - Mini Mental State Exam  Orientation to time '5 5 5 5  '$ Orientation to Place '5 5 5 5  '$ Registration '3 3 3 3  '$ Attention/ Calculation 5 5 0 0  Recall '3 3 3 3  '$ Language- name 2 objects   0 0  Language- repeat '1 1 1 1  '$ Language- follow 3 step command   3 3  Language- read & follow direction   0 0  Write a sentence   0 0  Copy design   0 0  Total score   20 20         05/19/2022   11:28 AM  6CIT Screen  What Year? 0 points  What month? 0 points  What time? 0 points  Count back from 20 0 points  Months in reverse 0 points  Repeat phrase 0 points  Total Score 0 points    Immunizations Immunization History  Administered Date(s) Administered   Fluad Quad(high Dose 65+) 02/18/2020, 02/09/2022   Influenza, High Dose Seasonal PF 02/17/2016, 02/25/2017, 02/15/2019, 03/05/2021   Influenza,inj,Quad PF,6+ Mos 02/15/2018   Moderna Sars-Covid-2 Vaccination 06/09/2019, 07/07/2019, 03/07/2020   PFIZER Comirnaty(Gray Top)Covid-19 Tri-Sucrose Vaccine 08/15/2020   Pneumococcal Conjugate-13 05/10/2014   Pneumococcal Polysaccharide-23 02/13/2015  Tdap 05/10/2010   Zoster Recombinat (Shingrix) 05/15/2018, 07/10/2018   Zoster, Live 06/10/2014    TDAP status: Due, Education has been provided regarding the importance of this vaccine. Advised may receive this vaccine at local pharmacy or Health Dept. Aware to provide a copy of the vaccination record if obtained from local pharmacy or Health Dept. Verbalized acceptance and understanding.  Flu Vaccine status: Up to date  Pneumococcal vaccine status: Up to date  Covid-19 vaccine status: Completed vaccines  Qualifies for Shingles Vaccine? Yes   Zostavax completed Yes   Shingrix Completed?: Yes  Screening Tests Health Maintenance  Topic Date Due   DTaP/Tdap/Td (2 - Td or Tdap) 05/10/2020   COLONOSCOPY (Pts 45-25yr Insurance coverage will need to be confirmed)  09/17/2021   COVID-19 Vaccine (5 - 2023-24 season) 01/08/2022   Diabetic kidney evaluation - eGFR measurement  05/26/2022   Diabetic kidney evaluation - Urine ACR  07/01/2022   HEMOGLOBIN A1C  08/11/2022   FOOT EXAM  02/10/2023   OPHTHALMOLOGY EXAM  04/16/2023   Medicare Annual Wellness (AWV)  05/20/2023   Pneumonia Vaccine 77 Years old  Completed   INFLUENZA VACCINE  Completed   DEXA SCAN  Completed   Hepatitis C Screening  Completed    Zoster Vaccines- Shingrix  Completed   HPV VACCINES  Aged Out    Health Maintenance  Health Maintenance Due  Topic Date Due   DTaP/Tdap/Td (2 - Td or Tdap) 05/10/2020   COLONOSCOPY (Pts 45-473yrInsurance coverage will need to be confirmed)  09/17/2021   COVID-19 Vaccine (5 - 2023-24 season) 01/08/2022   Diabetic kidney evaluation - eGFR measurement  05/26/2022    Colorectal cancer screening: Type of screening: Colonoscopy. Completed 09/17/16. Repeat every 5 years  Mammogram scheduled 06/14/22  Bone Density status: Completed 07/08/21. Results reflect: Bone density results: NORMAL. Repeat every 2 years.   Additional Screening:  Hepatitis C Screening: Completed 03/04/16  Vision Screening: Recommended annual ophthalmology exams for early detection of glaucoma and other disorders of the eye. Is the patient up to date with their annual eye exam?  Yes  Who is the provider or what is the name of the office in which the patient attends annual eye exams? Dr RiKerin Ransomf pt is not established with a provider, would they like to be referred to a provider to establish care? No .   Dental Screening: Recommended annual dental exams for proper oral hygiene  Community Resource Referral / Chronic Care Management: CRR required this visit?  No   CCM required this visit?  No      Plan:     I have personally reviewed and noted the following in the patient's chart:   Medical and social history Use of alcohol, tobacco or illicit drugs  Current medications and supplements including opioid prescriptions. Patient is not currently taking opioid prescriptions. Functional ability and status Nutritional status Physical activity Advanced directives List of other physicians Hospitalizations, surgeries, and ER visits in previous 12 months Vitals Screenings to include cognitive, depression, and falls Referrals and appointments  In addition, I have reviewed and discussed with patient certain preventive  protocols, quality metrics, and best practice recommendations. A written personalized care plan for preventive services as well as general preventive health recommendations were provided to patient.     TiWillette BraceLPN   05/10/58/7371 Nurse Notes: none

## 2022-05-26 ENCOUNTER — Encounter: Payer: Self-pay | Admitting: Gastroenterology

## 2022-05-27 ENCOUNTER — Ambulatory Visit
Admission: RE | Admit: 2022-05-27 | Discharge: 2022-05-27 | Disposition: A | Discharge status: Home / Self Care | Payer: Medicare Other | Attending: Gastroenterology | Admitting: Gastroenterology

## 2022-05-27 ENCOUNTER — Encounter
Admission: RE | Disposition: A | Discharge status: Home / Self Care | Payer: Self-pay | Source: Home / Self Care | Attending: Gastroenterology

## 2022-05-27 ENCOUNTER — Ambulatory Visit: Payer: Medicare Other | Admitting: Anesthesiology

## 2022-05-27 ENCOUNTER — Encounter: Payer: Self-pay | Admitting: Gastroenterology

## 2022-05-27 DIAGNOSIS — E1165 Type 2 diabetes mellitus with hyperglycemia: Secondary | ICD-10-CM | POA: Diagnosis not present

## 2022-05-27 DIAGNOSIS — K5790 Diverticulosis of intestine, part unspecified, without perforation or abscess without bleeding: Secondary | ICD-10-CM | POA: Diagnosis not present

## 2022-05-27 DIAGNOSIS — Z96652 Presence of left artificial knee joint: Secondary | ICD-10-CM | POA: Insufficient documentation

## 2022-05-27 DIAGNOSIS — Z1211 Encounter for screening for malignant neoplasm of colon: Secondary | ICD-10-CM | POA: Diagnosis not present

## 2022-05-27 DIAGNOSIS — K635 Polyp of colon: Secondary | ICD-10-CM | POA: Diagnosis not present

## 2022-05-27 DIAGNOSIS — K5730 Diverticulosis of large intestine without perforation or abscess without bleeding: Secondary | ICD-10-CM | POA: Diagnosis not present

## 2022-05-27 DIAGNOSIS — D124 Benign neoplasm of descending colon: Secondary | ICD-10-CM | POA: Diagnosis not present

## 2022-05-27 DIAGNOSIS — M1990 Unspecified osteoarthritis, unspecified site: Secondary | ICD-10-CM | POA: Insufficient documentation

## 2022-05-27 DIAGNOSIS — Z7985 Long-term (current) use of injectable non-insulin antidiabetic drugs: Secondary | ICD-10-CM | POA: Diagnosis not present

## 2022-05-27 DIAGNOSIS — K219 Gastro-esophageal reflux disease without esophagitis: Secondary | ICD-10-CM | POA: Diagnosis not present

## 2022-05-27 DIAGNOSIS — Z8616 Personal history of COVID-19: Secondary | ICD-10-CM | POA: Diagnosis not present

## 2022-05-27 DIAGNOSIS — I10 Essential (primary) hypertension: Secondary | ICD-10-CM | POA: Diagnosis not present

## 2022-05-27 DIAGNOSIS — Z7984 Long term (current) use of oral hypoglycemic drugs: Secondary | ICD-10-CM | POA: Diagnosis not present

## 2022-05-27 DIAGNOSIS — Z96612 Presence of left artificial shoulder joint: Secondary | ICD-10-CM | POA: Insufficient documentation

## 2022-05-27 DIAGNOSIS — I2510 Atherosclerotic heart disease of native coronary artery without angina pectoris: Secondary | ICD-10-CM | POA: Diagnosis not present

## 2022-05-27 DIAGNOSIS — I480 Paroxysmal atrial fibrillation: Secondary | ICD-10-CM | POA: Insufficient documentation

## 2022-05-27 DIAGNOSIS — E119 Type 2 diabetes mellitus without complications: Secondary | ICD-10-CM | POA: Diagnosis not present

## 2022-05-27 DIAGNOSIS — K640 First degree hemorrhoids: Secondary | ICD-10-CM | POA: Diagnosis not present

## 2022-05-27 DIAGNOSIS — Z79899 Other long term (current) drug therapy: Secondary | ICD-10-CM | POA: Insufficient documentation

## 2022-05-27 DIAGNOSIS — Z86010 Personal history of colonic polyps: Secondary | ICD-10-CM | POA: Diagnosis not present

## 2022-05-27 DIAGNOSIS — E785 Hyperlipidemia, unspecified: Secondary | ICD-10-CM | POA: Diagnosis not present

## 2022-05-27 HISTORY — PX: COLONOSCOPY WITH PROPOFOL: SHX5780

## 2022-05-27 LAB — GLUCOSE, CAPILLARY: Glucose-Capillary: 92 mg/dL (ref 70–99)

## 2022-05-27 SURGERY — COLONOSCOPY WITH PROPOFOL
Anesthesia: General

## 2022-05-27 MED ORDER — LIDOCAINE HCL (CARDIAC) PF 100 MG/5ML IV SOSY
PREFILLED_SYRINGE | INTRAVENOUS | Status: DC | PRN
  Administered 2022-05-27: 50 mg via INTRAVENOUS

## 2022-05-27 MED ORDER — PROPOFOL 10 MG/ML IV BOLUS
INTRAVENOUS | Status: DC | PRN
  Administered 2022-05-27: 100 mg via INTRAVENOUS

## 2022-05-27 MED ORDER — PROPOFOL 500 MG/50ML IV EMUL
INTRAVENOUS | Status: DC | PRN
  Administered 2022-05-27: 130 ug/kg/min via INTRAVENOUS

## 2022-05-27 MED ORDER — PROPOFOL 1000 MG/100ML IV EMUL
INTRAVENOUS | Status: AC
  Filled 2022-05-27: qty 100

## 2022-05-27 MED ORDER — SODIUM CHLORIDE 0.9 % IV SOLN: INTRAVENOUS | Status: DC

## 2022-05-27 NOTE — Anesthesia Postprocedure Evaluation (Signed)
Anesthesia Post Note  Patient: Radio broadcast assistant  Procedure(s) Performed: COLONOSCOPY WITH PROPOFOL  Patient location during evaluation: PACU Anesthesia Type: General Level of consciousness: awake and awake and alert Pain management: pain level controlled Vital Signs Assessment: post-procedure vital signs reviewed and stable Respiratory status: spontaneous breathing and nonlabored ventilation Cardiovascular status: stable Anesthetic complications: no  No notable events documented.   Last Vitals:  Vitals:   05/27/22 0955 05/27/22 1005  BP: (!) 111/41 (!) 123/52  Pulse: 73 73  Resp: 20 19  Temp:    SpO2: 100% 100%    Last Pain:  Vitals:   05/27/22 1005  TempSrc:   PainSc: 0-No pain                 VAN STAVEREN,Abdiaziz Klahn

## 2022-05-27 NOTE — Transfer of Care (Signed)
Immediate Anesthesia Transfer of Care Note  Patient: Kelsey Weaver  Procedure(s) Performed: COLONOSCOPY WITH PROPOFOL  Patient Location: PACU and Endoscopy Unit  Anesthesia Type:General  Level of Consciousness: drowsy and patient cooperative  Airway & Oxygen Therapy: Patient Spontanous Breathing  Post-op Assessment: Report given to RN and Post -op Vital signs reviewed and stable  Post vital signs: Reviewed and stable  Last Vitals:  Vitals Value Taken Time  BP 101/48 05/27/22 0947  Temp    Pulse 76 05/27/22 0947  Resp 22 05/27/22 0947  SpO2 98 % 05/27/22 0947    Last Pain:  Vitals:   05/27/22 0947  TempSrc:   PainSc: Asleep         Complications: No notable events documented.

## 2022-05-27 NOTE — H&P (Signed)
Kelsey Lame, MD [facility] [address] [place], [place] [address] Phone:[phone] Fax : [phone]  Primary Care Physician:  Pleas Koch, NP Primary Gastroenterologist:  Dr. Allen Norris  Pre-Procedure History & Physical: HPI:  Kelsey Weaver is a 77 y.o. female is here for an colonoscopy.   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    Past Surgical History:  Procedure Laterality Date   ABLATION  10/31/2007, 08/26/2008, 09/22/2012, 07/01/2015   AFib ablation x 4 in [place]   CATARACT EXTRACTION Bilateral    COLONOSCOPY     CYSTECTOMY     subsebacous x 2   DILATION AND CURETTAGE OF UTERUS  at age 64   EYE SURGERY     FOOT SURGERY Bilateral 2008, 2014   4 hammertoes and bunionectomy   implantable loop recorder pacement  01/19/13   MDT Reveal LINQ implanted in [place] for afib management   JOINT REPLACEMENT Left 07/2017   shoulder   KNEE SURGERY Left 2016   arthoscopy    LIGATION / DIVISION SAPHENOUS VEIN Bilateral    POLYPECTOMY     REVERSE SHOULDER ARTHROPLASTY Left 08/02/2017   Procedure: TOTAL SHOULDER ARTHROPLASTY VS. REVERSE;  Surgeon: Corky Mull, MD;  Location: [facility] ORS;  Service: Orthopedics;  Laterality: Left;   TOTAL KNEE ARTHROPLASTY Left 06/08/2018   Procedure: TOTAL KNEE ARTHROPLASTY;  Surgeon: Corky Mull, MD;  Location: [facility] ORS;  Service: Orthopedics;  Laterality: Left;    Prior to Admission medications    Medication Sig Start Date End Date Taking? Authorizing Provider  aspirin EC 81 MG tablet Take 1 tablet (81 mg total) by mouth daily. Swallow whole. 06/30/21  Yes Gollan, Kathlene November, MD  atenolol (TENORMIN) 25 MG tablet Take 1 tablet (25 mg total) by mouth daily. 02/24/22  Yes Gollan, Kathlene November, MD  ezetimibe (ZETIA) 10 MG tablet Take 1 tablet (10 mg total) by mouth daily. 06/30/21  Yes Minna Merritts, MD  famotidine (PEPCID) 20 MG tablet Take 20 mg by mouth at bedtime.    Yes [provider]  flecainide (TAMBOCOR) 50 MG tablet Take 1 tablet (50 mg total) by mouth 2 (two) times daily. 06/30/21  Yes Minna Merritts, MD  ibuprofen (ADVIL,MOTRIN) 800 MG tablet Take 800 mg by mouth every 8 (eight) hours as needed for headache or moderate pain.  06/09/16  Yes [provider]  metFORMIN (GLUCOPHAGE-XR) 500 MG 24 hr tablet TAKE 1 TABLET(500 MG) BY MOUTH DAILY WITH BREAKFAST FOR DIABETES 03/15/22  Yes Pleas Koch, NP  acetaminophen (TYLENOL) 650 MG CR tablet Take 650 mg by mouth every 8 (eight) hours as needed for pain. Patient taking 2 times per day    [provider]  Cholecalciferol (VITAMIN D3) 125 MCG (5000 UT) CAPS Take 1 capsule by mouth daily.     [provider]  Cyanocobalamin (VITAMIN B-12 PO) Take 1  tablet by mouth daily.    [provider]  diclofenac sodium (VOLTAREN) 1 % GEL Apply 2 g topically 3 (three) times daily as needed (for knee pain).  02/15/18   [provider]  Multiple Vitamins-Minerals (PRESERVISION AREDS 2) CAPS Take 1 tablet by mouth daily.     [provider]  Omega-3 Fatty Acids (FISH OIL) 1200 MG CAPS daily. 04/09/21   [provider]  pravastatin (PRAVACHOL) 40 MG tablet Take 1 tablet (40 mg total) by mouth every evening. For cholesterol. Office visit required for further refills. 06/30/21   Minna Merritts, MD  Semaglutide,0.25 or 0.'5MG'$ /DOS, (OZEMPIC, 0.25 OR 0.5 MG/DOSE,) 2 MG/3ML SOPN Inject 0.5  mg into the skin once a week. for diabetes. 04/28/22   Pleas Koch, NP  traZODone (DESYREL) 150 MG tablet Take 1 tablet (150 mg total) by mouth at bedtime. For sleep. 07/01/21   Pleas Koch, NP  triamterene-hydrochlorothiazide (MAXZIDE-25) 37.5-25 MG tablet TAKE 0.5 TABLETS BY MOUTH DAILY AS NEEDED (EDEMA). 01/05/21   Allred, Jeneen Rinks, MD  Turmeric 500 MG TABS Take 500 mg by mouth daily.    [provider]    Allergies as of 04/06/2022 - Review Complete 02/09/2022  Allergen Reaction Noted   Ambien [zolpidem tartrate] Other (See Comments) 11/03/2015   Compazine [prochlorperazine edisylate] Other (See Comments) 11/03/2015   Metoprolol Other (See Comments) 11/03/2015   Reglan [metoclopramide] Anxiety 11/03/2015    Family History  Problem Relation Age of Onset   Hypertension Mother    Glaucoma Mother    CAD Father    Atrial fibrillation Father    Gallbladder disease Sister    Cholelithiasis Sister    CAD Brother    Hypertension Brother    Atrial fibrillation Brother    Hypercholesterolemia Brother    Heart disease Son 20       stent placement   Heart attack Son    Colon cancer Neg Hx    Breast cancer Neg Hx     Social History   Socioeconomic History   Marital status: Married    Spouse name: Not on file   Number of children: 3   Years of education: Not on file   Highest education level: Not on file  Occupational History   Occupation: Retired Therapist, sports  Tobacco Use   Smoking status: Never   Smokeless tobacco: Never  Vaping Use   Vaping Use: Never used  Substance and Sexual Activity   Alcohol use: Yes    Alcohol/week: 0.0 standard drinks of alcohol    Comment: socially   Drug use: No   Sexual activity: Not on file  Other Topics Concern   Not on file  Social History Narrative   Pt recently moved to [place]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    Once worked in L&D and NICU.    Son is a Marine scientist at [employer] and also works for [employer] .   Enjoys spending  time with her family.    Social Determinants of Health   Financial Resource Strain: Low Risk  (05/19/2022)   Overall Financial Resource Strain (CARDIA)    Difficulty of Paying Living Expenses: Not hard at all  Food Insecurity: No Food Insecurity (05/19/2022)   Hunger Vital Sign    Worried About Running Out of Food in the Last Year: Never true    Ran Out of Food in the Last Year: Never true  Transportation Needs: No Transportation Needs (05/19/2022)   PRAPARE -  Hydrologist (Medical): No    Lack of Transportation (Non-Medical): No  Physical Activity: Insufficiently Active (05/19/2022)   Exercise Vital Sign    Days of Exercise per Week: 2 days    Minutes of Exercise per Session: 60 min  Stress: No Stress Concern Present (05/19/2022)   [place]    Feeling of Stress : Not at all  Social Connections: [place] (05/14/2021)   Social Connection and Isolation Panel [NHANES]    Frequency of Communication with Friends and Family: More than three times a week    Frequency of Social Gatherings with Friends and Family: Twice a week    Attends Religious Services: More than 4 times per year    Active Member of [employer] or Organizations: Yes    Attends Music therapist: More than 4 times per year    Marital Status: Married  Human resources officer Violence: Not At Risk (05/19/2022)   Humiliation, Afraid, Rape, and Kick questionnaire    Fear of Current or Ex-Partner: No    Emotionally Abused: No    Physically Abused: No    Sexually Abused: No    Review of Systems: See HPI, otherwise negative ROS  Physical Exam: BP (!) 130/58   Pulse 77   Temp (!) 96.4 F (35.8 C) (Temporal)   Resp 16   SpO2 100%  General:   Alert,  pleasant and cooperative in NAD Head:  Normocephalic and atraumatic. Neck:  Supple; no masses or thyromegaly. Lungs:  Clear throughout to auscultation.    Heart:  Regular  rate and rhythm. Abdomen:  Soft, nontender and nondistended. Normal bowel sounds, without guarding, and without rebound.   Neurologic:  Alert and  oriented x4;  grossly normal neurologically.  Impression/Plan: Kelsey Weaver is here for an colonoscopy to be performed for a history of adenomatous polyps on 2018   Risks, benefits, limitations, and alternatives regarding  colonoscopy have been reviewed with the patient.  Questions have been answered.  All parties agreeable.   Kelsey Lame, MD  05/27/2022, 9:14 AM

## 2022-05-27 NOTE — Anesthesia Preprocedure Evaluation (Signed)
Anesthesia Evaluation  Patient identified by MRN, date of birth, ID band Patient awake    Reviewed: Allergy & Precautions, NPO status , Patient's Chart, lab work & pertinent test results  Airway Mallampati: II  TM Distance: >3 FB Neck ROM: full    Dental  (+) Teeth Intact   Pulmonary neg pulmonary ROS   Pulmonary exam normal breath sounds clear to auscultation       Cardiovascular Exercise Tolerance: Good hypertension, Pt. on medications + CAD  negative cardio ROS Normal cardiovascular exam+ dysrhythmias Atrial Fibrillation  Rhythm:Regular Rate:Normal     Neuro/Psych negative neurological ROS  negative psych ROS   GI/Hepatic negative GI ROS, Neg liver ROS,GERD  Medicated,,  Endo/Other  negative endocrine ROSdiabetes, Type 2, Oral Hypoglycemic Agents    Renal/GU negative Renal ROS  negative genitourinary   Musculoskeletal  (+) Arthritis ,    Abdominal  (+) + obese  Peds negative pediatric ROS (+)  Hematology negative hematology ROS (+)   Anesthesia Other Findings Past Medical History: No date: Cardiac arrhythmia due to congenital heart disease 02/20/2021: COVID-19 virus infection No date: Diabetes mellitus without complication (HCC) No date: Diverticulosis No date: DJD (degenerative joint disease)     Comment:  frozen shoulder No date: Frozen shoulder     Comment:  on left with nerve impingement No date: GERD (gastroesophageal reflux disease) No date: History of colonic polyps No date: Hyperlipemia No date: Hypertension No date: Overweight 09/2000: Paroxysmal atrial fibrillation ([place]) 08/02/2017: Status post reverse total shoulder replacement, 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: Status post total knee replacement using cement, left  Past Surgical History: 10/31/2007, 08/26/2008, 09/22/2012, 07/01/2015: ABLATION     Comment:  [place] x 4 in [place] No date: CATARACT EXTRACTION; Bilateral No date: COLONOSCOPY No date:  CYSTECTOMY     Comment:  subsebacous x 2 at age 81: [place] No date: EYE SURGERY 2008, 2014: FOOT SURGERY; Bilateral     Comment:  4 hammertoes and bunionectomy 01/19/13: implantable loop recorder pacement     Comment:  MDT Reveal LINQ implanted in [place] for afib management 07/2017: JOINT REPLACEMENT; Left     Comment:  shoulder 2016: KNEE SURGERY; Left     Comment:  arthoscopy  No date: LIGATION / DIVISION SAPHENOUS VEIN; Bilateral No date: POLYPECTOMY 08/02/2017: REVERSE SHOULDER ARTHROPLASTY; Left     Comment:  Procedure: TOTAL SHOULDER ARTHROPLASTY VS. REVERSE;                Surgeon: Corky Mull, MD;  Location: [facility] ORS;                Service: Orthopedics;  Laterality: Left; 06/08/2018: TOTAL KNEE ARTHROPLASTY; Left     Comment:  Procedure: TOTAL KNEE ARTHROPLASTY;  Surgeon: Corky Mull, MD;  Location: [facility] ORS;  Service: Orthopedics;                Laterality: Left;     Reproductive/Obstetrics negative OB ROS                             Anesthesia Physical Anesthesia Plan  ASA: 3  Anesthesia Plan: General   Post-op Pain Management:    Induction: Intravenous  PONV Risk Score and Plan: Propofol infusion and TIVA  Airway Management Planned: Natural Airway  Additional Equipment:   Intra-op Plan:   Post-operative Plan:  Informed Consent: I have reviewed the patients History and Physical, chart, labs and discussed the procedure including the risks, benefits and alternatives for the proposed anesthesia with the patient or authorized representative who has indicated his/her understanding and acceptance.     Dental Advisory Given  Plan Discussed with: CRNA and Surgeon  Anesthesia Plan Comments:        Anesthesia Quick Evaluation

## 2022-05-27 NOTE — Op Note (Signed)
[facility] Patient Name: Kelsey Weaver Procedure Date: 05/27/2022 9:18 AM MRN: [id number] Account #: [id number] Date of Birth: 1945-08-06 Admit Type: Outpatient Age: 77 Room: [facility] ENDO ROOM 4 Gender: Female Note Status: Finalized Instrument Name: Jasper Riling [id number] Procedure:             Colonoscopy Indications:           High risk colon cancer surveillance: Personal history                         of colonic polyps Providers:             [name] MD, MD Referring MD:          Pleas Koch (Referring MD) Medicines:             Propofol per Anesthesia Complications:         No immediate complications. Procedure:             Pre-Anesthesia Assessment:                        - Prior to the procedure, a History and Physical was                         performed, and patient medications and allergies were                         reviewed. The patient's tolerance of previous                         anesthesia was also reviewed. The risks and benefits                         of the procedure and the sedation options and risks                         were discussed with the patient. All questions were                         answered, and informed consent was obtained. Prior                         Anticoagulants: The patient has taken no anticoagulant                         or antiplatelet agents. ASA Grade Assessment: II - A                         patient with mild systemic disease. After reviewing                         the risks and benefits, the patient was deemed in                         satisfactory condition to undergo the procedure.                        After obtaining informed consent, the colonoscope was  passed under direct vision. Throughout the procedure,                         the patient's blood pressure, pulse, and oxygen                         saturations were monitored continuously. The                          Colonoscope was introduced through the anus and                         advanced to the the cecum, identified by appendiceal                         orifice and ileocecal valve. The colonoscopy was                         performed without difficulty. The patient tolerated                         the procedure well. The quality of the bowel                         preparation was excellent. Findings:      The perianal and digital rectal examinations were normal.      A 3 mm polyp was found in the descending colon. The polyp was sessile.       The polyp was removed with a cold biopsy forceps. Resection and       retrieval were complete.      Multiple medium-mouthed and small-mouthed diverticula were found in the       sigmoid colon.      Non-bleeding internal hemorrhoids were found during retroflexion. The       hemorrhoids were Grade I (internal hemorrhoids that do not prolapse). Impression:            - One 3 mm polyp in the descending colon, removed with                         a cold biopsy forceps. Resected and retrieved.                        - Diverticulosis in the sigmoid colon.                        - Non-bleeding internal hemorrhoids. Recommendation:        - Discharge patient to home.                        - Resume previous diet.                        - Continue present medications.                        - Await pathology results.                        - Repeat colonoscopy is not recommended for  surveillance. Procedure Code(s):     --- Professional ---                        [id number], Colonoscopy, flexible; with biopsy, single or                         multiple Diagnosis Code(s):     --- Professional ---                        Z86.010, Personal history of colonic polyps                        D12.4, Benign neoplasm of descending colon CPT copyright 2022 American Medical Association. All rights reserved. The codes documented in  this report are preliminary and upon coder review may  be revised to meet current compliance requirements. [name] MD, MD 05/27/2022 9:50:49 AM This report has been signed electronically. Number of Addenda: 0 Note Initiated On: 05/27/2022 9:18 AM Scope Withdrawal Time: 0 hours 10 minutes 54 seconds  Total Procedure Duration: 0 hours 16 minutes 42 seconds  Estimated Blood Loss:  Estimated blood loss: none.      [facility]

## 2022-05-28 ENCOUNTER — Encounter: Payer: Self-pay | Admitting: Gastroenterology

## 2022-05-28 LAB — SURGICAL PATHOLOGY

## 2022-05-31 ENCOUNTER — Encounter: Payer: Self-pay | Admitting: Gastroenterology

## 2022-06-02 NOTE — Telephone Encounter (Signed)
Kelsey Weaver, please call [facility] to find out what's going on with her Ozempic. Let me know.

## 2022-06-03 NOTE — Telephone Encounter (Signed)
Called and spoke to [facility] pharmacy, they stated a PA is required.  Started the PA for Ozempic and awaiting determination.   Called and notified patient that PA has been submitted, will reach out when we have a determination from insurance.  Key: [id number]

## 2022-06-14 ENCOUNTER — Other Ambulatory Visit: Payer: Self-pay | Admitting: Primary Care

## 2022-06-14 ENCOUNTER — Ambulatory Visit
Admission: RE | Admit: 2022-06-14 | Discharge: 2022-06-14 | Disposition: A | Discharge status: Home / Self Care | Payer: Medicare Other | Source: Ambulatory Visit | Attending: Primary Care | Admitting: Primary Care

## 2022-06-14 ENCOUNTER — Other Ambulatory Visit: Payer: Self-pay | Admitting: Cardiovascular Disease

## 2022-06-14 DIAGNOSIS — I25118 Atherosclerotic heart disease of native coronary artery with other forms of angina pectoris: Secondary | ICD-10-CM

## 2022-06-14 DIAGNOSIS — G4700 Insomnia, unspecified: Secondary | ICD-10-CM

## 2022-06-14 DIAGNOSIS — Z1231 Encounter for screening mammogram for malignant neoplasm of breast: Secondary | ICD-10-CM | POA: Diagnosis not present

## 2022-06-22 ENCOUNTER — Encounter: Payer: Medicare Other | Admitting: Primary Care

## 2022-07-02 ENCOUNTER — Encounter: Payer: Medicare Other | Admitting: Primary Care

## 2022-07-06 ENCOUNTER — Encounter: Payer: Self-pay | Admitting: Primary Care

## 2022-07-06 ENCOUNTER — Ambulatory Visit (INDEPENDENT_AMBULATORY_CARE_PROVIDER_SITE_OTHER): Payer: Medicare Other | Admitting: Primary Care

## 2022-07-06 VITALS — BP 136/62 | HR 76 | Temp 97.0°F | Ht 66.0 in | Wt 183.0 lb

## 2022-07-06 DIAGNOSIS — I480 Paroxysmal atrial fibrillation: Secondary | ICD-10-CM

## 2022-07-06 DIAGNOSIS — G4700 Insomnia, unspecified: Secondary | ICD-10-CM

## 2022-07-06 DIAGNOSIS — E1165 Type 2 diabetes mellitus with hyperglycemia: Secondary | ICD-10-CM | POA: Diagnosis not present

## 2022-07-06 DIAGNOSIS — K219 Gastro-esophageal reflux disease without esophagitis: Secondary | ICD-10-CM | POA: Diagnosis not present

## 2022-07-06 DIAGNOSIS — E7849 Other hyperlipidemia: Secondary | ICD-10-CM | POA: Diagnosis not present

## 2022-07-06 DIAGNOSIS — I2510 Atherosclerotic heart disease of native coronary artery without angina pectoris: Secondary | ICD-10-CM

## 2022-07-06 LAB — COMPREHENSIVE METABOLIC PANEL
ALT: 21 U/L (ref 0–35)
AST: 23 U/L (ref 0–37)
Albumin: 4.2 g/dL (ref 3.5–5.2)
Alkaline Phosphatase: 53 U/L (ref 39–117)
BUN: 19 mg/dL (ref 6–23)
CO2: 31 mEq/L (ref 19–32)
Calcium: 9.9 mg/dL (ref 8.4–10.5)
Chloride: 100 mEq/L (ref 96–112)
Creatinine, Ser: 0.73 mg/dL (ref 0.40–1.20)
GFR: 79.91 mL/min (ref >60.00)
Glucose, Bld: 90 mg/dL (ref 70–99)
Potassium: 4 mEq/L (ref 3.5–5.1)
Sodium: 139 mEq/L (ref 135–145)
Total Bilirubin: 0.6 mg/dL (ref 0.2–1.2)
Total Protein: 6.8 g/dL (ref 6.0–8.3)

## 2022-07-06 LAB — LIPID PANEL
Cholesterol: 101 mg/dL (ref 0–200)
HDL: 39.7 mg/dL (ref >39.00)
LDL Cholesterol: 36 mg/dL (ref 0–99)
NonHDL: 61.23
Total CHOL/HDL Ratio: 3
Triglycerides: 128 mg/dL (ref 0.0–149.0)
VLDL: 25.6 mg/dL (ref 0.0–40.0)

## 2022-07-06 LAB — HEMOGLOBIN A1C: Hgb A1c MFr Bld: 5.7 % (ref 4.6–6.5)

## 2022-07-06 LAB — MICROALBUMIN / CREATININE URINE RATIO
Creatinine,U: 242.9 mg/dL
Microalb Creat Ratio: 0.9 mg/g (ref 0.0–30.0)
Microalb, Ur: 2.1 mg/dL — ABNORMAL HIGH (ref 0.0–1.9)

## 2022-07-06 NOTE — Assessment & Plan Note (Signed)
Repeat lipid panel pending.  Continue Zetia 10 mg daily.  Commended her on weight loss!

## 2022-07-06 NOTE — Assessment & Plan Note (Signed)
Asymptomatic.  Continue BP control, lipid control, diabetes control. Commended her on weight loss!

## 2022-07-06 NOTE — Assessment & Plan Note (Signed)
Controlled and doing well on 100 mg HS. Will change Rx to 100 mg once refills are due.

## 2022-07-06 NOTE — Assessment & Plan Note (Signed)
Repeat A1C pending.  Commended her on weight loss!  Continue Ozempic 0.5 mg weekly and metformin XR 500 mg daily. Urine microalbumin due and pending.  Follow up in 6 months

## 2022-07-06 NOTE — Progress Notes (Signed)
Subjective:    Patient ID: Kelsey Weaver, female    DOB: January 13, 1946, 77 y.o.   MRN: [id number]  HPI  Kelsey Weaver is a very pleasant 77 y.o. female with a history of type 2 diabetes, CAD, hyperlipidemia, restless legs, atrial fibrillation who presents today for follow up of chronic conditions.   for complete physical and follow up of chronic conditions.  Immunizations: -Tetanus: Completed in 2012 -Influenza: Completed this season -Shingles: Completed Shingrix series -Pneumonia: Completed Prevnar 13 in 2016 and Pneumovax 23 in 2016  Mammogram: Completed in February 2024  Colonoscopy: Completed in 2024, due 2029 Dexa: Completed in 2023    1) Atrial Fibrillation: Currently managed on flecainide 50 mg BID and atenolol 25 mg daily. Following with cardiology, last visit was in December 2023 for pre procedure cardiac risk stratification for colonoscopy. No changes made to her regimen.  2) Hypertension/CAD/Hyperlipidemia: Following with cardiology. Currently managed on atenolol 25 mg daily, Zetia 10 mg daily, pravastatin 40 mg daily, triamterene-HCTZ 37.5-25 mg daily.  BP Readings from Last 3 Encounters:  07/06/22 136/62  05/27/22 (!) 123/52  04/20/22 122/68     3) Type 2 Diabetes:  Current medications include: metformin XR 500 mg daily, Ozempic 0.5 mg weekly.   She is checking her blood glucose 0 times daily.  Last A1C: 6.2 in October 2023 Last Eye Exam: UTD Last Foot Exam: UTD Pneumonia Vaccination: 2016 Urine Microalbumin: Due Statin: pravastatin   Dietary changes since last visit: Smaller portion sizes    Exercise: 2 days weekly    Wt Readings from Last 3 Encounters:  07/06/22 183 lb (83 kg)  05/19/22 187 lb (84.8 kg)  04/20/22 197 lb 6.4 oz (89.5 kg)     4) Insomnia: Currently managed on Trazodone 150 mg HS. Overall taking 100 mg HS as 150 mg is too much. Denies symptoms of anxiety and depression.   Review of Systems  Constitutional:  Negative for  unexpected weight change.  HENT:  Negative for rhinorrhea.   Respiratory:  Negative for cough and shortness of breath.   Cardiovascular:  Negative for chest pain.  Gastrointestinal:  Negative for constipation and diarrhea.  Genitourinary:  Negative for difficulty urinating.  Skin:  Negative for rash.  Allergic/Immunologic: Negative for environmental allergies.  Neurological:  Negative for dizziness and headaches.  Psychiatric/Behavioral:  The patient is not nervous/anxious.          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    Social History   Socioeconomic History   Marital status: Married    Spouse name: Not on file   Number of children: 3   Years of education: Not on file   Highest education level: Not on file  Occupational History   Occupation: Retired Therapist, sports  Tobacco Use   Smoking status: Never   Smokeless tobacco: Never  Vaping Use   Vaping Use: Never used  Substance  and Sexual Activity   Alcohol use: Yes    Alcohol/week: 0.0 standard drinks of alcohol    Comment: socially   Drug use: No   Sexual activity: Not on file  Other Topics Concern   Not on file  Social History Narrative   Pt recently moved to [place]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    Once worked in L&D and NICU.    Son is a Marine scientist at [employer] and also works for [employer] .   Enjoys spending time with her family.    Social Determinants of  Health   Financial Resource Strain: Low Risk  (05/19/2022)   Overall Financial Resource Strain (CARDIA)    Difficulty of Paying Living Expenses: Not hard at all  Food Insecurity: No Food Insecurity (05/19/2022)   Hunger Vital Sign    Worried About Running Out of Food in the Last Year: Never true    Ran Out of Food in the Last Year: Never true  Transportation Needs: No Transportation Needs (05/19/2022)   PRAPARE - Hydrologist (Medical): No    Lack of Transportation (Non-Medical): No  Physical Activity: Insufficiently Active (05/19/2022)   Exercise Vital Sign    Days of Exercise per Week: 2 days    Minutes of Exercise per Session: 60 min  Stress: No Stress Concern Present (05/19/2022)   [place]    Feeling of Stress : Not at all  Social Connections: [place] (05/14/2021)   Social Connection and Isolation Panel [NHANES]    Frequency of Communication with Friends and Family: More than three times a week    Frequency of Social Gatherings with Friends and Family: Twice a week    Attends Religious Services: More than 4 times per year    Active Member of [employer] or Organizations: Yes    Attends Archivist Meetings: More than 4 times per year    Marital Status: Married  Human resources officer Violence: Not At Risk (05/19/2022)   Humiliation, Afraid, Rape, and Kick questionnaire    Fear of Current or Ex-Partner: No    Emotionally Abused: No    Physically Abused: No    Sexually Abused: No    Past Surgical History:  Procedure Laterality Date   ABLATION  10/31/2007, 08/26/2008, 09/22/2012, 07/01/2015   AFib ablation x 4 in [place]   CATARACT EXTRACTION Bilateral    COLONOSCOPY     COLONOSCOPY WITH PROPOFOL N/A 05/27/2022   Procedure: COLONOSCOPY WITH PROPOFOL;  Surgeon: [name], MD;  Location: [facility];  Service: Endoscopy;  Laterality: N/A;   CYSTECTOMY     subsebacous x 2    DILATION AND CURETTAGE OF UTERUS  at age 54   EYE SURGERY     FOOT SURGERY Bilateral 2008, 2014   4 hammertoes and bunionectomy   implantable loop recorder pacement  01/19/13   MDT Reveal LINQ implanted in [place] for afib management   JOINT REPLACEMENT Left 07/2017   shoulder   KNEE SURGERY Left 2016   arthoscopy    LIGATION / DIVISION SAPHENOUS VEIN Bilateral    POLYPECTOMY     REVERSE SHOULDER ARTHROPLASTY Left 08/02/2017   Procedure: TOTAL SHOULDER ARTHROPLASTY VS. REVERSE;  Surgeon: Corky Mull, MD;  Location: [facility];  Service: Orthopedics;  Laterality: Left;   TOTAL KNEE ARTHROPLASTY Left 06/08/2018   Procedure: TOTAL KNEE ARTHROPLASTY;  Surgeon: Corky Mull, MD;  Location: [facility];  Service: Orthopedics;  Laterality: Left;    Family History  Problem Relation Age of Onset   Hypertension Mother    Glaucoma Mother    CAD Father    Atrial fibrillation Father    Gallbladder disease Sister    Cholelithiasis Sister    CAD Brother    Hypertension Brother    Atrial fibrillation Brother    Hypercholesterolemia Brother    Heart disease Son 50       stent placement   Heart attack Son    Colon cancer Neg Hx    Breast cancer Neg Hx     Allergies  Allergen Reactions   Ambien [Zolpidem Tartrate] Other (See Comments)    Flu like symptoms    Compazine [Prochlorperazine Edisylate] Other (See Comments)    Aches and pains, generalized   Metoprolol Other (See Comments)    Depression    Reglan [Metoclopramide] Anxiety    Current Outpatient Medications on File Prior to Visit  Medication Sig Dispense Refill   acetaminophen (TYLENOL) 650 MG CR tablet Take 650 mg by mouth every 8 (eight) hours as needed for pain. Patient taking 2 times per day     aspirin EC 81 MG tablet Take 1 tablet (81 mg total) by mouth daily. Swallow whole. 90 tablet 3   atenolol (TENORMIN) 25 MG tablet Take 1 tablet (25 mg total) by mouth daily. 90 tablet 0   Cholecalciferol (VITAMIN D3) 125 MCG (5000 UT) CAPS  Take 1 capsule by mouth daily.      Cyanocobalamin (VITAMIN B-12 PO) Take 1 tablet by mouth daily.     diclofenac sodium (VOLTAREN) 1 % GEL Apply 2 g topically 3 (three) times daily as needed (for knee pain).   1   ezetimibe (ZETIA) 10 MG tablet TAKE 1 TABLET(10 MG) BY MOUTH DAILY 90 tablet 3   famotidine (PEPCID) 20 MG tablet Take 20 mg by mouth at bedtime.      flecainide (TAMBOCOR) 50 MG tablet Take 1 tablet (50 mg total) by mouth 2 (two) times daily. 180 tablet 3   metFORMIN (GLUCOPHAGE-XR) 500 MG 24 hr tablet TAKE 1 TABLET(500 MG) BY MOUTH DAILY WITH BREAKFAST FOR DIABETES 90 tablet 1   Multiple Vitamins-Minerals (PRESERVISION AREDS 2) CAPS Take 1 tablet by mouth daily.      Omega-3 Fatty Acids (FISH OIL) 1200 MG CAPS daily.     pravastatin (PRAVACHOL) 40 MG tablet TAKE 1 TABLET BY MOUTH EVERY EVENING 90 tablet 3   Semaglutide,0.25 or 0.'5MG'$ /DOS, (OZEMPIC, 0.25 OR 0.5 MG/DOSE,) 2 MG/3ML SOPN Inject 0.5 mg into the skin once a week. for diabetes. 9 mL 0   traZODone (DESYREL) 150 MG tablet TAKE 1 TABLET(150 MG) BY MOUTH AT BEDTIME FOR SLEEP 90 tablet 0   Turmeric 500 MG TABS Take 500 mg by mouth daily.     ibuprofen (ADVIL,MOTRIN) 800 MG tablet Take 800 mg by mouth every 8 (eight) hours as needed for headache or moderate pain.  (Patient not taking: Reported on 07/06/2022)     triamterene-hydrochlorothiazide (MAXZIDE-25) 37.5-25 MG tablet TAKE 0.5 TABLETS BY MOUTH DAILY AS NEEDED (EDEMA). (Patient not taking: Reported on 07/06/2022) 45 tablet 3   Current Facility-Administered Medications on File Prior to Visit  Medication Dose Route Frequency Provider Last Rate Last Admin   0.9 %  sodium chloride infusion  500 mL Intravenous Continuous Milus Banister, MD        BP 136/62   Pulse 76   Temp (!) 97 F (36.1  C) (Temporal)   Ht '5\' 6"'$  (1.676 m)   Wt 183 lb (83 kg)   SpO2 97%   BMI 29.54 kg/m  Objective:   Physical Exam HENT:     Right Ear: Tympanic membrane and ear canal normal.     Left  Ear: Tympanic membrane and ear canal normal.     Nose: Nose normal.  Eyes:     Conjunctiva/sclera: Conjunctivae normal.     Pupils: Pupils are equal, round, and reactive to light.  Neck:     Thyroid: No thyromegaly.  Cardiovascular:     Rate and Rhythm: Normal rate and regular rhythm.     Heart sounds: No murmur heard. Pulmonary:     Effort: Pulmonary effort is normal.     Breath sounds: Normal breath sounds. No rales.  Abdominal:     General: Bowel sounds are normal.     Palpations: Abdomen is soft.     Tenderness: There is no abdominal tenderness.  Musculoskeletal:        General: Normal range of motion.     Cervical back: Neck supple.  Lymphadenopathy:     Cervical: No cervical adenopathy.  Skin:    General: Skin is warm and dry.     Findings: No rash.  Neurological:     Mental Status: She is alert and oriented to person, place, and time.     Cranial Nerves: No cranial nerve deficit.     Deep Tendon Reflexes: Reflexes are normal and symmetric.  Psychiatric:        Mood and Affect: Mood normal.           Assessment & Plan:  Type 2 diabetes mellitus with hyperglycemia, without long-term current use of insulin (HCC) Assessment & Plan: Repeat A1C pending.  Commended her on weight loss!  Continue Ozempic 0.5 mg weekly and metformin XR 500 mg daily. Urine microalbumin due and pending.  Follow up in 6 months  Orders: -     Microalbumin / creatinine urine ratio -     Hemoglobin A1c -     Comprehensive metabolic panel  Other hyperlipidemia Assessment & Plan: Repeat lipid panel pending.  Continue Zetia 10 mg daily.  Commended her on weight loss!  Orders: -     Lipid panel  Paroxysmal atrial fibrillation (HCC) Assessment & Plan: Rate and rhythm controlled.  Continue flecainide 50 mg BID and atenolol 25 mg daily. Following with cardiology, reviewed cardiology notes from December 2023.   Coronary artery disease involving native coronary artery of native  heart without angina pectoris Assessment & Plan: Asymptomatic.  Continue BP control, lipid control, diabetes control. Commended her on weight loss!   Gastroesophageal reflux disease, unspecified whether esophagitis present Assessment & Plan: Controlled.  Continue famotidine 20 mg daily.    Insomnia, unspecified type Assessment & Plan: Controlled and doing well on 100 mg HS. Will change Rx to 100 mg once refills are due.         Pleas Koch, NP

## 2022-07-06 NOTE — Assessment & Plan Note (Signed)
Rate and rhythm controlled.  Continue flecainide 50 mg BID and atenolol 25 mg daily. Following with cardiology, reviewed cardiology notes from December 2023.

## 2022-07-06 NOTE — Patient Instructions (Signed)
Stop by the lab prior to leaving today. I will notify you of your results once received.   Please schedule a follow up visit for 6 months for a diabetes check.  It was a pleasure to see you today!

## 2022-07-06 NOTE — Assessment & Plan Note (Signed)
Controlled.  Continue famotidine 20 mg daily.

## 2022-08-12 ENCOUNTER — Other Ambulatory Visit: Payer: Self-pay | Admitting: Cardiovascular Disease

## 2022-08-20 ENCOUNTER — Other Ambulatory Visit: Payer: Self-pay | Admitting: Primary Care

## 2022-08-20 DIAGNOSIS — E1165 Type 2 diabetes mellitus with hyperglycemia: Secondary | ICD-10-CM

## 2022-08-26 ENCOUNTER — Telehealth: Payer: Self-pay | Admitting: Primary Care

## 2022-08-26 DIAGNOSIS — E1165 Type 2 diabetes mellitus with hyperglycemia: Secondary | ICD-10-CM

## 2022-08-26 DIAGNOSIS — G4700 Insomnia, unspecified: Secondary | ICD-10-CM

## 2022-08-26 MED ORDER — METFORMIN HCL ER 500 MG PO TB24: 500.0000 mg | ORAL_TABLET | Freq: Every day | ORAL | 0 refills | Status: DC

## 2022-08-26 MED ORDER — TRAZODONE HCL 150 MG PO TABS: 150.0000 mg | ORAL_TABLET | Freq: Every day | ORAL | 0 refills | Status: DC

## 2022-08-26 NOTE — Telephone Encounter (Signed)
Noted, both [facility] [place].

## 2022-08-26 NOTE — Telephone Encounter (Signed)
Patient is in [place] and she mistakenly left her medications here at home. She would like to know if she can possibly have about 4 each of medication  metFORMIN (GLUCOPHAGE-XR) 500 MG 24 hr table and  traZODone (DESYREL) 150 MG tablet called  in for her to  [id number] - [place], [place] - [address] AT [facility] [address] Phone: [phone]  Fax: [phone]

## 2022-09-10 ENCOUNTER — Other Ambulatory Visit: Payer: Self-pay | Admitting: Primary Care

## 2022-09-10 DIAGNOSIS — G4700 Insomnia, unspecified: Secondary | ICD-10-CM

## 2022-09-10 DIAGNOSIS — E1165 Type 2 diabetes mellitus with hyperglycemia: Secondary | ICD-10-CM

## 2022-09-10 NOTE — Telephone Encounter (Signed)
Spoke with the patient. She confirmed that she does take the 100mg  trazodone for about 3-4 nights, but still wakes up. On the 4th or 5th night she takes 150mg  to get rest. Pt states she doesn't mind the 100mg  dose, but does need a few of the 150mg  as needed. Please advise.

## 2022-09-10 NOTE — Telephone Encounter (Signed)
Will proceed with her typical 150 mg dose. Refills sent to pharmacy.

## 2022-09-10 NOTE — Telephone Encounter (Signed)
Please call patient:  Received refill request for Trazodone 150 mg. During our last visit she mentioned doing well on just 100 mg. I can change to 100 mg dose. Just let me know.

## 2022-11-09 ENCOUNTER — Other Ambulatory Visit: Payer: Self-pay | Admitting: Primary Care

## 2022-11-09 DIAGNOSIS — E1165 Type 2 diabetes mellitus with hyperglycemia: Secondary | ICD-10-CM

## 2022-12-23 ENCOUNTER — Encounter (INDEPENDENT_AMBULATORY_CARE_PROVIDER_SITE_OTHER): Payer: Self-pay

## 2023-01-04 ENCOUNTER — Ambulatory Visit: Payer: Medicare Other | Admitting: Primary Care

## 2023-01-12 ENCOUNTER — Encounter: Payer: Self-pay | Admitting: Primary Care

## 2023-01-12 ENCOUNTER — Ambulatory Visit (INDEPENDENT_AMBULATORY_CARE_PROVIDER_SITE_OTHER): Payer: Medicare Other | Admitting: Primary Care

## 2023-01-12 VITALS — BP 100/54 | HR 87 | Temp 97.4°F | Ht 66.0 in | Wt 174.0 lb

## 2023-01-12 DIAGNOSIS — Z23 Encounter for immunization: Secondary | ICD-10-CM

## 2023-01-12 DIAGNOSIS — Z7985 Long-term (current) use of injectable non-insulin antidiabetic drugs: Secondary | ICD-10-CM

## 2023-01-12 DIAGNOSIS — Z7984 Long term (current) use of oral hypoglycemic drugs: Secondary | ICD-10-CM | POA: Diagnosis not present

## 2023-01-12 DIAGNOSIS — E1165 Type 2 diabetes mellitus with hyperglycemia: Secondary | ICD-10-CM | POA: Diagnosis not present

## 2023-01-12 LAB — POCT GLYCOSYLATED HEMOGLOBIN (HGB A1C): Hemoglobin A1C: 5.4 % (ref 4.0–5.6)

## 2023-01-12 MED ORDER — SEMAGLUTIDE (1 MG/DOSE) 4 MG/3ML ~~LOC~~ SOPN: 1.0000 mg | PEN_INJECTOR | SUBCUTANEOUS | 1 refills | Status: DC

## 2023-01-12 NOTE — Progress Notes (Signed)
Subjective:    Patient ID: Kelsey Weaver, female    DOB: 09-Mar-1946, 77 y.o.   MRN: [id number]  HPI  Kelsey Weaver is a very pleasant 77 y.o. female with a history of type 2 diabetes, CAD, atrial fibrillation, diabetic neuropathy, hyperlipidemia who presents today for follow-up of diabetes.  Current medications include: metformin XR 500 mg daily, Ozempic 0.5 mg weekly  She is checking her blood glucose on occasion and is getting readings in the low 100s.  She denies symptoms of hypoglycemia including dizziness, weakness. She would like to increase the dose of her Ozempic for weight loss purposes. She's also noticed a return in food cravings on the 0.5 mg dose.  Last A1C: 5.7 in February 2024, 5.4 today Last Eye Exam: UTD Last Foot Exam: UTD Pneumonia Vaccination: 2016 Urine Microalbumin: February 2024 Statin: pravastatin   Dietary changes since last visit: Limiting sweets and carbs.    Exercise:  Walking daily  BP Readings from Last 3 Encounters:  01/12/23 (!) 100/54  07/06/22 136/62  05/27/22 (!) 123/52   Wt Readings from Last 3 Encounters:  01/12/23 174 lb (78.9 kg)  07/06/22 183 lb (83 kg)  05/19/22 187 lb (84.8 kg)       Review of Systems  Gastrointestinal:  Negative for abdominal pain and diarrhea.  Neurological:  Negative for dizziness, numbness and headaches.         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   Status post total knee replacement using cement, left  06/08/2018    Social History   Socioeconomic History   Marital status: Married    Spouse name: Not on file   Number of children: 3   Years of education: Not on file   Highest education level: Bachelor's degree (e.g., BA, AB, BS)  Occupational History   Occupation: Retired Charity fundraiser  Tobacco Use   Smoking status: Never   Smokeless tobacco: Never  Vaping Use   Vaping status: Never Used  Substance and Sexual Activity   Alcohol use: Yes    Alcohol/week: 0.0 standard drinks of alcohol    Comment: socially   Drug use: No   Sexual activity: Not on file  Other Topics Concern   Not on file  Social History Narrative   Pt recently moved to [place] with spouse 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    Once worked in L&D and NICU.    Son is a Engineer, civil (consulting) at [employer] and also works for [employer].   Enjoys spending time with her family.    Social Determinants of Health   Financial Resource Strain: Low Risk  (01/11/2023)   Overall Financial Resource Strain (CARDIA)    Difficulty of Paying Living Expenses: Not hard at all  Food Insecurity: No Food Insecurity (01/11/2023)   Hunger Vital Sign    Worried About Running Out of Food in the Last Year: Never true    Ran Out of Food in the Last  Year: Never true  Transportation Needs: No Transportation Needs (01/11/2023)   PRAPARE - Administrator, Civil Service (Medical): No    Lack of Transportation (Non-Medical): No  Physical Activity: Sufficiently Active (01/11/2023)   Exercise Vital Sign    Days of Exercise per Week: 3 days    Minutes of Exercise per Session: 120 min  Stress: No Stress Concern Present (01/11/2023)   [employer] of Occupational Health - Occupational Stress Questionnaire    Feeling of Stress : Not at all  Social Connections: Socially Integrated (01/11/2023)   Social Connection and Isolation Panel [NHANES]    Frequency of Communication with Friends and Family: More than three times a week    Frequency of Social Gatherings with Friends and  Family: Three times a week    Attends Religious Services: More than 4 times per year    Active Member of Clubs or Organizations: Yes    Attends Banker Meetings: More than 4 times per year    Marital Status: Married  Catering manager Violence: Not At Risk (05/19/2022)   Humiliation, Afraid, Rape, and Kick questionnaire    Fear of Current or Ex-Partner: No    Emotionally Abused: No    Physically Abused: No    Sexually Abused: No    Past Surgical History:  Procedure Laterality Date   ABLATION  10/31/2007, 08/26/2008, 09/22/2012, 07/01/2015   AFib ablation x 4 in [place]   CATARACT EXTRACTION Bilateral    COLONOSCOPY     COLONOSCOPY WITH PROPOFOL N/A 05/27/2022   Procedure: COLONOSCOPY WITH PROPOFOL;  Surgeon: Midge Minium, MD;  Location: [facility];  Service: Endoscopy;  Laterality: N/A;   CYSTECTOMY     subsebacous x 2   DILATION AND CURETTAGE OF UTERUS  at age 25   EYE SURGERY     FOOT SURGERY Bilateral 2008, 2014   4 hammertoes and bunionectomy   implantable loop recorder pacement  01/19/13   MDT Reveal LINQ implanted in [place] for afib management   JOINT REPLACEMENT Left 07/2017   shoulder   KNEE SURGERY Left 2016   arthoscopy    LIGATION / DIVISION SAPHENOUS VEIN Bilateral    POLYPECTOMY     REVERSE SHOULDER ARTHROPLASTY Left 08/02/2017   Procedure: TOTAL SHOULDER ARTHROPLASTY VS. REVERSE;  Surgeon: Christena Flake, MD;  Location: [facility];  Service: Orthopedics;  Laterality: Left;   TOTAL KNEE ARTHROPLASTY Left 06/08/2018   Procedure: TOTAL KNEE ARTHROPLASTY;  Surgeon: Christena Flake, MD;  Location: [facility];  Service: Orthopedics;  Laterality: Left;    Family History  Problem Relation Age of Onset   Hypertension Mother    Glaucoma Mother    CAD Father    Atrial fibrillation Father    Gallbladder disease Sister    Cholelithiasis Sister    CAD Brother    Hypertension Brother    Atrial fibrillation Brother    Hypercholesterolemia Brother    Heart disease Son 72        stent placement   Heart attack Son    Colon cancer Neg Hx    Breast cancer Neg Hx     Allergies  Allergen Reactions   Ambien [Zolpidem Tartrate] Other (See Comments)    Flu like symptoms    Compazine [Prochlorperazine Edisylate] Other (See Comments)    Aches and pains, generalized   Metoprolol Other (See Comments)    Depression    Reglan [Metoclopramide] Anxiety    Current Outpatient Medications on File Prior to  Visit  Medication Sig Dispense Refill   acetaminophen (TYLENOL) 650 MG CR tablet Take 650 mg by mouth every 8 (eight) hours as needed for pain. Patient taking 2 times per day     aspirin EC 81 MG tablet Take 1 tablet (81 mg total) by mouth daily. Swallow whole. 90 tablet 3   atenolol (TENORMIN) 25 MG tablet TAKE ONE TABLET BY MOUTH DAILY 90 tablet 2   Cholecalciferol (VITAMIN D3) 125 MCG (5000 UT) CAPS Take 1 capsule by mouth daily.      Cyanocobalamin (VITAMIN B-12 PO) Take 1 tablet by mouth daily.     diclofenac sodium (VOLTAREN) 1 % GEL Apply 2 g topically 3 (three) times daily as needed (for knee pain).   1   ezetimibe (ZETIA) 10 MG tablet TAKE 1 TABLET(10 MG) BY MOUTH DAILY 90 tablet 3   famotidine (PEPCID) 20 MG tablet Take 20 mg by mouth at bedtime.      flecainide (TAMBOCOR) 50 MG tablet TAKE ONE TABLET BY MOUTH TWICE DAILY 180 tablet 2   metFORMIN (GLUCOPHAGE-XR) 500 MG 24 hr tablet TAKE 1 TABLET BY MOUTH DAILY WITH BREAKFAST FOR DIABETES 90 tablet 1   Multiple Vitamins-Minerals (PRESERVISION AREDS 2) CAPS Take 1 tablet by mouth daily.      Omega-3 Fatty Acids (FISH OIL) 1200 MG CAPS daily.     pravastatin (PRAVACHOL) 40 MG tablet TAKE 1 TABLET BY MOUTH EVERY EVENING 90 tablet 3   traZODone (DESYREL) 150 MG tablet Take 1 tablet (150 mg total) by mouth at bedtime. For sleep 90 tablet 2   Turmeric 500 MG TABS Take 500 mg by mouth daily.     traZODone (DESYREL) 150 MG tablet TAKE 1 TABLET(150 MG) BY MOUTH AT BEDTIME FOR SLEEP 90 tablet 0   Current  Facility-Administered Medications on File Prior to Visit  Medication Dose Route Frequency Provider Last Rate Last Admin   0.9 %  sodium chloride infusion  500 mL Intravenous Continuous Rachael Fee, MD        BP (!) 100/54   Pulse 87   Temp (!) 97.4 F (36.3 C) (Temporal)   Ht 5\' 6"  (1.676 m)   Wt 174 lb (78.9 kg)   SpO2 94%   BMI 28.08 kg/m  Objective:   Physical Exam Cardiovascular:     Rate and Rhythm: Normal rate and regular rhythm.  Pulmonary:     Effort: Pulmonary effort is normal.     Breath sounds: Normal breath sounds.  Musculoskeletal:     Cervical back: Neck supple.  Skin:    General: Skin is warm and dry.           Assessment & Plan:  Type 2 diabetes mellitus with hyperglycemia, without long-term current use of insulin (HCC) Assessment & Plan: Controlled and improved with A1c of 5.4 today.  Continue Metformin XR 500 mg daily. Increase Ozempic to 1 mg weekly per patient request for weight loss purposes.   Eye exam and foot exam are up to date. Urine microalbumin up to date.   Schedule follow-up visit in 6 months.   Orders: -     POCT glycosylated hemoglobin (Hb A1C) -     Semaglutide (1 MG/DOSE); Inject 1 mg as directed once a week. for diabetes.  Dispense: 9 mL; Refill: 1  Encounter for immunization -     Flu Vaccine Trivalent High Dose (Fluad)        Doreene Nest, NP

## 2023-01-12 NOTE — Patient Instructions (Signed)
We increased the dose of your Ozempic to 1 mg weekly for weight loss purposes.   Continue metformin for now.  Please schedule a physical to meet with me in 6 months.   It was a pleasure to see you today!

## 2023-01-12 NOTE — Assessment & Plan Note (Signed)
Controlled and improved with A1c of 5.4 today.  Continue Metformin XR 500 mg daily. Increase Ozempic to 1 mg weekly per patient request for weight loss purposes.   Eye exam and foot exam are up to date. Urine microalbumin up to date.   Schedule follow-up visit in 6 months.

## 2023-02-01 ENCOUNTER — Ambulatory Visit: Payer: Medicare Other | Admitting: Primary Care

## 2023-03-07 DIAGNOSIS — M25512 Pain in left shoulder: Secondary | ICD-10-CM | POA: Diagnosis not present

## 2023-03-07 DIAGNOSIS — Z96612 Presence of left artificial shoulder joint: Secondary | ICD-10-CM | POA: Diagnosis not present

## 2023-03-07 DIAGNOSIS — S43102A Unspecified dislocation of left acromioclavicular joint, initial encounter: Secondary | ICD-10-CM | POA: Diagnosis not present

## 2023-03-07 DIAGNOSIS — S4992XA Unspecified injury of left shoulder and upper arm, initial encounter: Secondary | ICD-10-CM | POA: Diagnosis not present

## 2023-03-08 ENCOUNTER — Other Ambulatory Visit: Payer: Self-pay | Admitting: Primary Care

## 2023-03-08 DIAGNOSIS — E1165 Type 2 diabetes mellitus with hyperglycemia: Secondary | ICD-10-CM

## 2023-03-10 DIAGNOSIS — S4352XA Sprain of left acromioclavicular joint, initial encounter: Secondary | ICD-10-CM | POA: Diagnosis not present

## 2023-03-10 DIAGNOSIS — Z96612 Presence of left artificial shoulder joint: Secondary | ICD-10-CM | POA: Diagnosis not present

## 2023-03-21 ENCOUNTER — Ambulatory Visit
Admission: RE | Admit: 2023-03-21 | Discharge: 2023-03-21 | Disposition: A | Discharge status: Home / Self Care | Payer: Medicare Other | Source: Ambulatory Visit | Attending: Student | Admitting: Student

## 2023-03-21 ENCOUNTER — Other Ambulatory Visit: Payer: Self-pay | Admitting: Student

## 2023-03-21 DIAGNOSIS — Z96612 Presence of left artificial shoulder joint: Secondary | ICD-10-CM | POA: Diagnosis not present

## 2023-03-21 DIAGNOSIS — S4352XD Sprain of left acromioclavicular joint, subsequent encounter: Secondary | ICD-10-CM | POA: Diagnosis not present

## 2023-03-21 DIAGNOSIS — W010XXD Fall on same level from slipping, tripping and stumbling without subsequent striking against object, subsequent encounter: Secondary | ICD-10-CM | POA: Diagnosis not present

## 2023-03-21 DIAGNOSIS — S42135D Nondisplaced fracture of coracoid process, left shoulder, subsequent encounter for fracture with routine healing: Secondary | ICD-10-CM | POA: Diagnosis not present

## 2023-03-21 DIAGNOSIS — M47812 Spondylosis without myelopathy or radiculopathy, cervical region: Secondary | ICD-10-CM | POA: Diagnosis not present

## 2023-03-21 DIAGNOSIS — S42132A Displaced fracture of coracoid process, left shoulder, initial encounter for closed fracture: Secondary | ICD-10-CM | POA: Diagnosis not present

## 2023-03-21 DIAGNOSIS — M8580 Other specified disorders of bone density and structure, unspecified site: Secondary | ICD-10-CM | POA: Diagnosis not present

## 2023-04-18 DIAGNOSIS — S4352XD Sprain of left acromioclavicular joint, subsequent encounter: Secondary | ICD-10-CM | POA: Diagnosis not present

## 2023-04-18 DIAGNOSIS — W010XXD Fall on same level from slipping, tripping and stumbling without subsequent striking against object, subsequent encounter: Secondary | ICD-10-CM | POA: Diagnosis not present

## 2023-04-18 DIAGNOSIS — S42135D Nondisplaced fracture of coracoid process, left shoulder, subsequent encounter for fracture with routine healing: Secondary | ICD-10-CM | POA: Diagnosis not present

## 2023-04-18 DIAGNOSIS — Z96612 Presence of left artificial shoulder joint: Secondary | ICD-10-CM | POA: Diagnosis not present

## 2023-04-27 DIAGNOSIS — L570 Actinic keratosis: Secondary | ICD-10-CM | POA: Diagnosis not present

## 2023-04-29 ENCOUNTER — Other Ambulatory Visit: Payer: Self-pay | Admitting: Cardiovascular Disease

## 2023-04-29 NOTE — Telephone Encounter (Signed)
Good Morning,  Could you schedule this patient a 12 month follow up visit? The patient was last seen by R. Dunn on 04-20-2022. Thank you so much.

## 2023-04-30 ENCOUNTER — Other Ambulatory Visit: Payer: Self-pay | Admitting: Cardiovascular Disease

## 2023-05-02 NOTE — Telephone Encounter (Signed)
Appointment scheduled for 06/28/23

## 2023-05-02 NOTE — Telephone Encounter (Signed)
Pt scheduled on 2.18

## 2023-05-02 NOTE — Telephone Encounter (Signed)
Please contact pt for future appointment. Pt due for follow up.

## 2023-05-02 NOTE — Telephone Encounter (Signed)
last office visit: 04/20/22 plan to f/u in 12 months  next visit:  06/28/23

## 2023-05-16 ENCOUNTER — Other Ambulatory Visit: Payer: Self-pay | Admitting: Primary Care

## 2023-05-16 ENCOUNTER — Encounter: Payer: Self-pay | Admitting: Primary Care

## 2023-05-16 DIAGNOSIS — Z1231 Encounter for screening mammogram for malignant neoplasm of breast: Secondary | ICD-10-CM

## 2023-05-23 ENCOUNTER — Ambulatory Visit (INDEPENDENT_AMBULATORY_CARE_PROVIDER_SITE_OTHER): Payer: Medicare Other

## 2023-05-23 ENCOUNTER — Other Ambulatory Visit: Payer: Self-pay | Admitting: Primary Care

## 2023-05-23 VITALS — BP 118/68 | Ht 66.0 in | Wt 180.0 lb

## 2023-05-23 DIAGNOSIS — G4700 Insomnia, unspecified: Secondary | ICD-10-CM

## 2023-05-23 DIAGNOSIS — Z0000 Encounter for general adult medical examination without abnormal findings: Secondary | ICD-10-CM | POA: Diagnosis not present

## 2023-05-23 MED ORDER — TRAZODONE HCL 150 MG PO TABS: 150.0000 mg | ORAL_TABLET | Freq: Every day | ORAL | 0 refills | Status: DC

## 2023-05-23 NOTE — Progress Notes (Signed)
 Subjective:   Kelsey Weaver is a 78 y.o. female who presents for Medicare Annual (Subsequent) preventive examination.  Visit Complete: In person  Patient Medicare AWV questionnaire was completed by the patient on 05/22/2023; I have confirmed that all information answered by patient is correct and no changes since this date.  Cardiac Risk Factors include: advanced age (>55men, >13 women);dyslipidemia;diabetes mellitus    Objective:    Today's Vitals   05/22/23 1641 05/23/23 1548  Weight:  180 lb (81.6 kg)  Height:  5' 6 (1.676 m)  PainSc: 3     Body mass index is 29.05 kg/m.     05/23/2023    4:05 PM 05/19/2022   11:24 AM 05/14/2021   11:21 AM 04/01/2020    8:24 AM 03/04/2020   11:07 AM 02/21/2019   12:08 PM 06/08/2018   11:53 AM  Advanced Directives  Does Patient Have a Medical Advance Directive? No Yes No No No No No  Type of Furniture Conservator/restorer;Living will       Does patient want to make changes to medical advance directive?   Yes (MAU/Ambulatory/Procedural Areas - Information given)      Copy of Healthcare Power of Attorney in Chart?  No - copy requested       Would patient like information on creating a medical advance directive?    No - Patient declined  No - Patient declined No - Patient declined    Current Medications (verified) Outpatient Encounter Medications as of 05/23/2023  Medication Sig   aspirin  EC 81 MG tablet Take 1 tablet (81 mg total) by mouth daily. Swallow whole.   atenolol  (TENORMIN ) 25 MG tablet TAKE 1 TABLET BY MOUTH EVERY DAY   Cholecalciferol  (VITAMIN D3) 125 MCG (5000 UT) CAPS Take 1 capsule by mouth daily.    Cyanocobalamin  (VITAMIN B-12 PO) Take 1 tablet by mouth daily.   diclofenac sodium (VOLTAREN) 1 % GEL Apply 2 g topically 3 (three) times daily as needed (for knee pain).    ezetimibe  (ZETIA ) 10 MG tablet TAKE 1 TABLET(10 MG) BY MOUTH DAILY   famotidine  (PEPCID ) 20 MG tablet Take 20 mg by mouth at bedtime.     flecainide  (TAMBOCOR ) 50 MG tablet TAKE 1 TABLET BY MOUTH TWICE A DAY   metFORMIN  (GLUCOPHAGE -XR) 500 MG 24 hr tablet TAKE 1 TABLET BY MOUTH DAILY WITH BREAKFAST FOR DIABETES   Multiple Vitamins-Minerals (PRESERVISION AREDS 2) CAPS Take 1 tablet by mouth daily.    Omega-3 Fatty Acids (FISH OIL) 1200 MG CAPS daily.   pravastatin  (PRAVACHOL ) 40 MG tablet TAKE 1 TABLET BY MOUTH EVERY EVENING   Semaglutide , 1 MG/DOSE, 4 MG/3ML SOPN Inject 1 mg as directed once a week. for diabetes.   traZODone  (DESYREL ) 150 MG tablet TAKE 1 TABLET(150 MG) BY MOUTH AT BEDTIME FOR SLEEP (Patient taking differently: Please send this prescription to [facility] only)   traZODone  (DESYREL ) 150 MG tablet Take 1 tablet (150 mg total) by mouth at bedtime. For sleep   Turmeric 500 MG TABS Take 500 mg by mouth daily.   acetaminophen  (TYLENOL ) 650 MG CR tablet Take 650 mg by mouth every 8 (eight) hours as needed for pain. Patient taking 2 times per day   No facility-administered encounter medications on file as of 05/23/2023.    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   GERD (gastroesophageal reflux disease)    History of colonic polyps    Hyperlipemia    Hypertension    Overweight    Paroxysmal atrial fibrillation (HCC) 09/2000   Status post reverse total shoulder replacement, 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   Status post total knee replacement using cement, 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   Past Surgical History:  Procedure Laterality Date   ABLATION  10/31/2007, 08/26/2008, 09/22/2012, 07/01/2015   AFib ablation x 4 in [place]   CATARACT EXTRACTION  Bilateral    COLONOSCOPY     COLONOSCOPY WITH PROPOFOL  N/A 05/27/2022   Procedure: COLONOSCOPY WITH PROPOFOL ;  Surgeon: Jinny Carmine, MD;  Location: [facility] END[facility];  Service: Endoscopy;  Laterality: N/A;   CYSTECTOMY     subsebacous x 2   DILATION AND CURETTAGE OF UTERUS  at age 85   EYE SURGERY     FOOT SURGERY Bilateral 2008, 2014   4 hammertoes and bunionectomy   implantable loop recorder pacement  01/19/13   MDT Reveal LINQ implanted in [place] for afib management   JOINT REPLACEMENT Left 07/2017   shoulder   KNEE SURGERY Left 2016   arthoscopy    LIGATION / DIVISION SAPHENOUS VEIN Bilateral    POLYPECTOMY     REVERSE SHOULDER ARTHROPLASTY Left 08/02/2017   Procedure: TOTAL SHOULDER ARTHROPLASTY VS. REVERSE;  Surgeon: Edie Norleen PARAS, MD;  Location: [facility] ORS;  Service: Orthopedics;  Laterality: Left;   TOTAL KNEE ARTHROPLASTY Left 06/08/2018   Procedure: TOTAL KNEE ARTHROPLASTY;  Surgeon: Edie Norleen PARAS, MD;  Location: [facility] ORS;  Service: Orthopedics;  Laterality: Left;   Family History  Problem Relation Age of Onset   Hypertension Mother    Glaucoma Mother    CAD Father    Atrial fibrillation Father    Gallbladder disease Sister    Cholelithiasis Sister    CAD Brother    Hypertension Brother    Atrial fibrillation Brother    Hypercholesterolemia Brother    Heart disease Son 26       stent placement   Heart attack Son    Colon cancer Neg Hx    Breast cancer Neg Hx    Social History   Socioeconomic History   Marital status: Married    Spouse name: Not on file   Number of children: 3   Years of education: Not on file   Highest education level: Bachelor's degree (e.g., BA, AB, BS)  Occupational History   Occupation: Retired [employer]  Tobacco Use   Smoking status: Never   Smokeless tobacco: Never  Vaping Use   Vaping status: Never Used  Substance and Sexual Activity   Alcohol use: Yes    Alcohol/week: 0.0 standard drinks of alcohol    Comment: socially   Drug use: No    Sexual activity: Not on file  Other Topics Concern   Not on file  Social History Narrative   Pt recently moved to [place] with spouse 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    Once worked in L&D and NICU.    Son is a engineer, civil (consulting) at [employer] and also works for [employer].   Enjoys spending time with her family.    Social Drivers of Health   Financial Resource Strain: Low Risk  (05/23/2023)   Overall  Financial Resource Strain (CARDIA)    Difficulty of Paying Living Expenses: Not hard at all  Food Insecurity: No Food Insecurity (05/23/2023)   Hunger Vital Sign    Worried About Running Out of Food in the Last Year: Never true    Ran Out of Food in the Last Year: Never true  Transportation Needs: No Transportation Needs (05/23/2023)   PRAPARE - Administrator, Civil Service (Medical): No    Lack of Transportation (Non-Medical): No  Physical Activity: Sufficiently Active (05/23/2023)   Exercise Vital Sign    Days of Exercise per Week: 3 days    Minutes of Exercise per Session: 120 min  Stress: No Stress Concern Present (05/23/2023)   [employer] of Occupational Health - Occupational Stress Questionnaire    Feeling of Stress : Only a little  Social Connections: Socially Integrated (05/23/2023)   Social Connection and Isolation Panel [NHANES]    Frequency of Communication with Friends and Family: More than three times a week    Frequency of Social Gatherings with Friends and Family: Three times a week    Attends Religious Services: More than 4 times per year    Active Member of Clubs or Organizations: Yes    Attends Engineer, Structural: More than 4 times per year    Marital Status: Married    Tobacco Counseling Counseling given: Not Answered  Clinical Intake:  Pre-visit preparation completed: No  Pain : 0-10 Pain Score: 3  Pain Type: Chronic pain Pain Location: Back Pain Orientation: Mid Pain Descriptors / Indicators: Aching Pain Onset: More than a month ago Pain  Frequency: Intermittent Pain Relieving Factors: IBU, asper-creme, heat  Pain Relieving Factors: IBU, asper-creme, heat  BMI - recorded: 29.05 Nutritional Status: BMI 25 -29 Overweight Nutritional Risks: None Diabetes: Yes CBG done?: No Did pt. bring in CBG monitor from home?: No  How often do you need to have someone help you when you read instructions, pamphlets, or other written materials from your doctor or pharmacy?: 1 - Never  Interpreter Needed?: No  Comments: lives with husband Information entered by :: B.Johnwilliam Shepperson,LPN   Activities of Daily Living    05/22/2023    4:41 PM  In your present state of health, do you have any difficulty performing the following activities:  Hearing? 0  Vision? 0  Difficulty concentrating or making decisions? 0  Walking or climbing stairs? 0  Dressing or bathing? 0  Doing errands, shopping? 0  Preparing Food and eating ? N  Using the Toilet? N  In the past six months, have you accidently leaked urine? Y  Do you have problems with loss of bowel control? N  Managing your Medications? N  Managing your Finances? N  Housekeeping or managing your Housekeeping? N    Patient Care Team: Gretta Comer POUR, NP as PCP - General (Internal Medicine) Perla Evalene PARAS, MD as PCP - Cardiology (Cardiology) Yvone Katz, MD as Referring Physician (Orthopedic Surgery) Ritter, Kevin J, OD (Optometry)  Indicate any recent Medical Services you may have received from other than [facility] providers in the past year (date may be approximate).     Assessment:   This is a routine wellness examination for [facility].  Hearing/Vision screen Hearing Screening - Comments:: Pt says her hearing is a little loss but ok Vision Screening - Comments:: Pt says her vision is good after the surgery Dr Lucio   Goals Addressed  This Visit's Progress    Increase physical activity       05/23/2023, I will continue to walk 2-3 miles twice weekly.       COMPLETED: Patient Stated   On track    02/21/2019, I will start increasing my exercise regimen daily.      Patient Stated   Not on track    04/01/2020, I will continue to walk 1 mile 2 days a week.      Patient Stated   On track    Would like to lose weight       Depression Screen    05/23/2023    3:59 PM 01/12/2023   10:11 AM 07/06/2022    9:05 AM 05/19/2022   11:26 AM 05/14/2021   11:25 AM 04/01/2020    8:25 AM 03/04/2020   11:03 AM  PHQ 2/9 Scores  PHQ - 2 Score 0 0 0 0 0 0 0  PHQ- 9 Score      0 0    Fall Risk    05/22/2023    4:41 PM 01/12/2023   10:11 AM 07/06/2022    9:04 AM 05/19/2022   11:27 AM 05/18/2022    7:47 PM  Fall Risk   Falls in the past year? 1 0 0 0 0  Number falls in past yr: 0 0 0 0   Injury with Fall? 1 0 0 0 0  Risk for fall due to : No Fall Risks No Fall Risks No Fall Risks Impaired vision   Follow up Education provided;Falls prevention discussed Falls evaluation completed Falls evaluation completed Falls prevention discussed     MEDICARE RISK AT HOME: Medicare Risk at Home Any stairs in or around the home?: (Patient-Rptd) Yes If so, are there any without handrails?: (Patient-Rptd) No Home free of loose throw rugs in walkways, pet beds, electrical cords, etc?: (Patient-Rptd) Yes Adequate lighting in your home to reduce risk of falls?: (Patient-Rptd) Yes Life alert?: (Patient-Rptd) No Use of a cane, walker or w/c?: (Patient-Rptd) No Grab bars in the bathroom?: (Patient-Rptd) No Shower chair or bench in shower?: (Patient-Rptd) Yes Elevated toilet seat or a handicapped toilet?: (Patient-Rptd) No  TIMED UP AND GO:  Was the test performed?  Yes  Length of time to ambulate 10 feet: 12 sec Gait steady and fast without use of assistive device    Cognitive Function:    04/01/2020    8:30 AM 02/21/2019   12:12 PM 02/15/2018   12:35 PM 12/03/2016   10:16 AM  MMSE - Mini Mental State Exam  Orientation to time 5 5 5 5   Orientation to Place 5 5 5 5    Registration 3 3 3 3   Attention/ Calculation 5 5 0 0  Recall 3 3 3 3   Language- name 2 objects   0 0  Language- repeat 1 1 1 1   Language- follow 3 step command   3 3  Language- read & follow direction   0 0  Write a sentence   0 0  Copy design   0 0  Total score   20 20        05/23/2023    4:08 PM 05/19/2022   11:28 AM  6CIT Screen  What Year? 0 points 0 points  What month? 0 points 0 points  What time? 0 points 0 points  Count back from 20 0 points 0 points  Months in reverse 0 points 0 points  Repeat phrase 0 points 0 points  Total Score 0 points 0 points    Immunizations Immunization History  Administered Date(s) Administered   Fluad Quad(high Dose 65+) 02/18/2020, 02/09/2022   Fluad Trivalent(High Dose 65+) 01/12/2023   Influenza, High Dose Seasonal PF 02/17/2016, 02/25/2017, 02/15/2019, 03/05/2021   Influenza,inj,Quad PF,6+ Mos 02/15/2018   Moderna Sars-Covid-2 Vaccination 06/09/2019, 07/07/2019, 03/07/2020   PFIZER Comirnaty(Gray Top)Covid-19 Tri-Sucrose Vaccine 08/15/2020   Pneumococcal Conjugate-13 05/10/2014   Pneumococcal Polysaccharide-23 02/13/2015   Tdap 05/10/2010   Zoster Recombinant(Shingrix) 05/15/2018, 07/10/2018   Zoster, Live 06/10/2014    TDAP status: Up to date  Flu Vaccine status: Up to date  Pneumococcal vaccine status: Up to date  Covid-19 vaccine status: Completed vaccines  Qualifies for Shingles Vaccine? Yes   Zostavax completed Yes   Shingrix Completed?: Yes  Screening Tests Health Maintenance  Topic Date Due   COVID-19 Vaccine (5 - 2024-25 season) 06/08/2023 (Originally 01/09/2023)   FOOT EXAM  08/08/2023 (Originally 02/10/2023)   OPHTHALMOLOGY EXAM  08/08/2023 (Originally 04/16/2023)   Diabetic kidney evaluation - eGFR measurement  07/07/2023   Diabetic kidney evaluation - Urine ACR  07/07/2023   HEMOGLOBIN A1C  07/12/2023   Medicare Annual Wellness (AWV)  05/22/2024   Colonoscopy  05/28/2027   Pneumonia Vaccine 56+ Years old   Completed   INFLUENZA VACCINE  Completed   DEXA SCAN  Completed   Hepatitis C Screening  Completed   Zoster Vaccines- Shingrix  Completed   HPV VACCINES  Aged Out   DTaP/Tdap/Td  Discontinued    Health Maintenance  There are no preventive care reminders to display for this patient.   Colorectal cancer screening: No longer required.   Mammogram status: No longer required due to age.  Bone Density status: Completed 07/08/2021. Results reflect: Bone density results: NORMAL. Repeat every 5 years.  Lung Cancer Screening: (Low Dose CT Chest recommended if Age 62-80 years, 20 pack-year currently smoking OR have quit w/in 15years.) does not qualify.   Lung Cancer Screening Referral: no  Additional Screening:  Hepatitis C Screening: does not qualify; Completed 03/04/2016  Vision Screening: Recommended annual ophthalmology exams for early detection of glaucoma and other disorders of the eye. Is the patient up to date with their annual eye exam?  Yes  Who is the provider or what is the name of the office in which the patient attends annual eye exams? Dr Lucio If pt is not established with a provider, would they like to be referred to a provider to establish care? No .   Dental Screening: Recommended annual dental exams for proper oral hygiene  Diabetic Foot Exam: Diabetic Foot Exam: Completed 06/11/2022  Community Resource Referral / Chronic Care Management: CRR required this visit?  No   CCM required this visit?  No     Plan:     I have personally reviewed and noted the following in the patient's chart:   Medical and social history Use of alcohol, tobacco or illicit drugs  Current medications and supplements including opioid prescriptions. Patient is not currently taking opioid prescriptions. Functional ability and status Nutritional status Physical activity Advanced directives List of other physicians Hospitalizations, surgeries, and ER visits in previous 12  months Vitals Screenings to include cognitive, depression, and falls Referrals and appointments  In addition, I have reviewed and discussed with patient certain preventive protocols, quality metrics, and best practice recommendations. A written personalized care plan for preventive services as well as general preventive health recommendations were provided to patient.    Erminio LITTIE Saris, LPN  05/23/2023   After Visit Summary: (MyChart) Due to this being a telephonic visit, the after visit summary with patients personalized plan was offered to patient via MyChart   Nurse Notes: The patient states she is doing well. She prefers and requests her rx for Trazadone be sent to [facility] on profile. No other questions at this time.

## 2023-05-23 NOTE — Patient Instructions (Signed)
 Ms. Breault , Thank you for taking time to come for your Medicare Wellness Visit. I appreciate your ongoing commitment to your health goals. Please review the following plan we discussed and let me know if I can assist you in the future.   Referrals/Orders/Follow-Ups/Clinician Recommendations: none  This is a list of the screening recommended for you and due dates:  Health Maintenance  Topic Date Due   COVID-19 Vaccine (5 - 2024-25 season) 01/09/2023   Complete foot exam   02/10/2023   Eye exam for diabetics  04/16/2023   Yearly kidney function blood test for diabetes  07/07/2023   Yearly kidney health urinalysis for diabetes  07/07/2023   Hemoglobin A1C  07/12/2023   Medicare Annual Wellness Visit  05/22/2024   Colon Cancer Screening  05/28/2027   Pneumonia Vaccine  Completed   Flu Shot  Completed   DEXA scan (bone density measurement)  Completed   Hepatitis C Screening  Completed   Zoster (Shingles) Vaccine  Completed   HPV Vaccine  Aged Out   DTaP/Tdap/Td vaccine  Discontinued    Advanced directives: (Declined) Advance directive discussed with you today. Even though you declined this today, please call our office should you change your mind, and we can give you the proper paperwork for you to fill out.  Next Medicare Annual Wellness Visit scheduled for next year: Yes 05/25/2023 @ 3:40pm In person

## 2023-05-25 ENCOUNTER — Other Ambulatory Visit: Payer: Self-pay | Admitting: Primary Care

## 2023-05-25 DIAGNOSIS — G4700 Insomnia, unspecified: Secondary | ICD-10-CM

## 2023-05-26 DIAGNOSIS — H26493 Other secondary cataract, bilateral: Secondary | ICD-10-CM | POA: Diagnosis not present

## 2023-05-26 DIAGNOSIS — E119 Type 2 diabetes mellitus without complications: Secondary | ICD-10-CM | POA: Diagnosis not present

## 2023-05-26 DIAGNOSIS — Z961 Presence of intraocular lens: Secondary | ICD-10-CM | POA: Diagnosis not present

## 2023-05-26 LAB — HM DIABETES EYE EXAM

## 2023-05-27 ENCOUNTER — Other Ambulatory Visit: Payer: Self-pay | Admitting: Cardiovascular Disease

## 2023-05-27 NOTE — Telephone Encounter (Signed)
Last office visit: 04/20/22 with plan to f/u 12 months. next office visit: 06/28/23

## 2023-06-03 ENCOUNTER — Other Ambulatory Visit: Payer: Self-pay | Admitting: Cardiovascular Disease

## 2023-06-03 ENCOUNTER — Other Ambulatory Visit: Payer: Self-pay | Admitting: Primary Care

## 2023-06-03 DIAGNOSIS — E1165 Type 2 diabetes mellitus with hyperglycemia: Secondary | ICD-10-CM

## 2023-06-03 DIAGNOSIS — G4700 Insomnia, unspecified: Secondary | ICD-10-CM

## 2023-06-07 DIAGNOSIS — Z96612 Presence of left artificial shoulder joint: Secondary | ICD-10-CM | POA: Diagnosis not present

## 2023-06-16 DIAGNOSIS — M25612 Stiffness of left shoulder, not elsewhere classified: Secondary | ICD-10-CM | POA: Diagnosis not present

## 2023-06-16 DIAGNOSIS — Z96612 Presence of left artificial shoulder joint: Secondary | ICD-10-CM | POA: Diagnosis not present

## 2023-06-21 DIAGNOSIS — Z96612 Presence of left artificial shoulder joint: Secondary | ICD-10-CM | POA: Diagnosis not present

## 2023-06-23 ENCOUNTER — Ambulatory Visit
Admission: RE | Admit: 2023-06-23 | Discharge: 2023-06-23 | Disposition: A | Discharge status: Home / Self Care | Payer: Medicare Other | Source: Ambulatory Visit | Attending: Primary Care | Admitting: Primary Care

## 2023-06-23 DIAGNOSIS — Z1231 Encounter for screening mammogram for malignant neoplasm of breast: Secondary | ICD-10-CM | POA: Diagnosis not present

## 2023-06-23 DIAGNOSIS — Z96612 Presence of left artificial shoulder joint: Secondary | ICD-10-CM | POA: Diagnosis not present

## 2023-06-24 ENCOUNTER — Other Ambulatory Visit: Payer: Self-pay | Admitting: Cardiovascular Disease

## 2023-06-25 ENCOUNTER — Other Ambulatory Visit: Payer: Self-pay | Admitting: Primary Care

## 2023-06-25 DIAGNOSIS — E1165 Type 2 diabetes mellitus with hyperglycemia: Secondary | ICD-10-CM

## 2023-06-27 NOTE — Progress Notes (Unsigned)
Cardiology Office Note  Date:  06/28/2023   ID:  Kelsey Weaver, DOB 11/09/1945, MRN [id number]  PCP:  Doreene Nest, NP   Chief Complaint  Patient presents with   12 month follow up     "Doing well."     HPI:  Kelsey Weaver is a 78 year old woman with past medical history of Atrial fibrillation, paroxysmal since 2002, hx of ablation x 4 COVID 2022 Diabetes Hypertension Hyperlipidemia Coronary calcification 1000 Who presents for follow-up of her coronary disease and A-fib  Last seen by myself in clinic February 2023 Seen by one of our providers December 2023  Cardiac CTA January 2023, reviewed 1. Coronary calcium score of 1033. This was 94th percentile for age-, sex, and race-matched controls. 2.  Normal coronary origin with right dominance. 3. Moderate atherosclerosis with heavily calcified vessels.   Lab work reviewed A1c 5.7 on ozempic, significant weight loss over the past several years Total cholesterol 101 LDL 36  Weight down 30 pounds  In general reports feeling well, minimal atrial fibrillation breakthrough spells Denies significant shortness of breath or chest pain on exertion concerning for angina No lower extremity edema, no PND orthopnea   EKG personally reviewed by myself on todays visit EKG Interpretation Date/Time:  Tuesday June 28 2023 09:56:00 EST Ventricular Rate:  83 PR Interval:  162 QRS Duration:  80 QT Interval:  370 QTC Calculation: 434 R Axis:   2  Text Interpretation: Normal sinus rhythm When compared with ECG of 24-May-2018 13:32, No significant change was found Confirmed by Julien Nordmann [phone]) on 06/28/2023 10:13:53 AM    Discussed recent imaging studies Cardiac CTA, images pulled up and reviewed 1. Coronary calcium score of 1033. This was 94th percentile for age-, sex, and race-matched controls.   2.  Normal coronary origin with right dominance.   3. Moderate atherosclerosis with heavily calcified vessels. CAD  RADS  4. Consider symptom-guided anti-ischemic and preventive pharmacotherapy as well as risk factor modification per guideline-directed care.   Coronary CTA FFR flow analysis demonstrates no hemodynamically flow limiting lesions.  Echocardiogram June 10, 2021, discussed  1. Left ventricular ejection fraction, by estimation, is 55 to 60%. The  left ventricle has normal function. The left ventricle has no regional  wall motion abnormalities. Left ventricular diastolic parameters are  consistent with Grade II diastolic  dysfunction (pseudonormalization).   2. Right ventricular systolic function is normal. The right ventricular  size is mildly enlarged.   3. Left atrial size was mildly dilated.   4. The mitral valve is degenerative. Mild mitral valve regurgitation.   5. The aortic valve is tricuspid. Aortic valve regurgitation is mild.  Aortic valve sclerosis is present, with no evidence of aortic valve  stenosis.    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  PSH:    Past Surgical History:  Procedure Laterality Date   ABLATION  10/31/2007, 08/26/2008, 09/22/2012, 07/01/2015   AFib ablation x 4 in [place]   CATARACT EXTRACTION Bilateral    COLONOSCOPY     COLONOSCOPY WITH PROPOFOL N/A 05/27/2022   Procedure: COLONOSCOPY WITH PROPOFOL;  Surgeon: Midge Minium, MD;  Location: [facility];  Service: Endoscopy;  Laterality: N/A;   CYSTECTOMY     subsebacous x 2   DILATION AND CURETTAGE OF UTERUS  at age 19    EYE SURGERY     FOOT SURGERY Bilateral 2008, 2014   4 hammertoes and bunionectomy   implantable loop recorder pacement  01/19/13   MDT Reveal LINQ implanted in [place] for afib management   JOINT REPLACEMENT Left 07/2017   shoulder   KNEE SURGERY Left 2016   arthoscopy    LIGATION / DIVISION SAPHENOUS VEIN Bilateral    POLYPECTOMY     REVERSE SHOULDER ARTHROPLASTY Left 08/02/2017   Procedure: TOTAL SHOULDER ARTHROPLASTY VS. REVERSE;  Surgeon: Christena Flake, MD;  Location: [facility] OR[facility];  Service: Orthopedics;  Laterality: Left;   TOTAL KNEE ARTHROPLASTY Left 06/08/2018   Procedure: TOTAL KNEE ARTHROPLASTY;  Surgeon: Christena Flake, MD;  Location: [facility] OR[facility];  Service: Orthopedics;  Laterality: Left;    Current Outpatient Medications  Medication Sig Dispense Refill   aspirin EC 81 MG tablet Take 1 tablet (81 mg total) by mouth daily. Swallow whole. 90 tablet 3   atenolol (TENORMIN) 25 MG tablet Take 1 tablet (25 mg total) by mouth daily. 30 tablet 6   Cholecalciferol (VITAMIN D3) 125 MCG (5000 UT) CAPS Take 1 capsule by mouth daily.      Cyanocobalamin (VITAMIN B-12 PO) Take 1 tablet by mouth daily.     ezetimibe (ZETIA) 10 MG tablet TAKE 1 TABLET BY MOUTH EVERY DAY 90 tablet 0   famotidine (PEPCID) 20 MG tablet Take 20 mg by mouth at bedtime.      flecainide (TAMBOCOR) 50 MG tablet TAKE 1 TABLET BY MOUTH TWICE A DAY 180 tablet 0   metFORMIN (GLUCOPHAGE-XR) 500 MG 24 hr tablet TAKE 1 TABLET BY MOUTH DAILY WITH BREAKFAST FOR DIABETES 90 tablet 0   Multiple Vitamins-Minerals (PRESERVISION AREDS 2) CAPS Take 1 tablet by mouth daily.      Omega-3 Fatty Acids (FISH OIL) 1200 MG CAPS daily.     pravastatin (PRAVACHOL) 40 MG tablet TAKE 1 TABLET BY MOUTH EVERY EVENING 90 tablet 3   Semaglutide, 1 MG/DOSE, (OZEMPIC, 1 MG/DOSE,) 4 MG/3ML SOPN INJECT 1 MG SUBCUTANEOUSLY AS DIRECTED ONCE A WEEK. FOR DIABETES. 9 mL 0   traZODone (DESYREL) 150 MG tablet Take 1 tablet (150 mg total) by mouth at bedtime. For sleep 90  tablet 0   Turmeric 500 MG TABS Take 500 mg by mouth daily.     diclofenac sodium (VOLTAREN) 1 % GEL Apply 2 g topically 3 (three) times daily as needed (for knee pain).  (Patient not taking: Reported on 06/28/2023)  1   No current facility-administered medications for this visit.     Allergies:   Ambien [zolpidem tartrate], Compazine [prochlorperazine edisylate], Metoprolol, Prochlorperazine, Metoclopramide, and Zolpidem   Social History:  The patient  reports that she has never smoked. She has never used smokeless tobacco. She reports current alcohol use. She reports that she does not use drugs.   Family History:   family history includes Atrial fibrillation in her brother and father; CAD in her brother and father; Cholelithiasis in her sister; Gallbladder disease in her sister; Glaucoma in her mother; Heart attack in her son; Heart disease (age of onset: 1) in her son; Hypercholesterolemia in her brother; Hypertension in her brother and mother.    Review of Systems: Review of Systems  Constitutional: Negative.   HENT: Negative.    Respiratory: Negative.    Cardiovascular: Negative.  Gastrointestinal: Negative.   Musculoskeletal: Negative.   Neurological: Negative.   Psychiatric/Behavioral: Negative.    All other systems reviewed and are negative.    PHYSICAL EXAM: VS:  BP 128/68 (BP Location: Left Arm, Patient Position: Sitting, Cuff Size: Normal)   Pulse 83   Ht 5\' 6"  (1.676 m)   Wt 181 lb 4 oz (82.2 kg)   SpO2 97%   BMI 29.25 kg/m  , BMI Body mass index is 29.25 kg/m. GEN: Well nourished, well developed, in no acute distress HEENT: normal Neck: no JVD, carotid bruits, or masses Cardiac: RRR; no murmurs, rubs, or gallops,no edema  Respiratory:  clear to auscultation bilaterally, normal work of breathing GI: soft, nontender, nondistended, + BS MS: no deformity or atrophy Skin: warm and dry, no rash Neuro:  Strength and sensation are intact Psych: euthymic mood, full  affect  Recent Labs: 07/06/2022: ALT 21; BUN 19; Creatinine, Ser 0.73; Potassium 4.0; Sodium 139    Lipid Panel Lab Results  Component Value Date   CHOL 101 07/06/2022   HDL 39.70 07/06/2022   LDLCALC 36 07/06/2022   TRIG 128.0 07/06/2022      Wt Readings from Last 3 Encounters:  06/28/23 181 lb 4 oz (82.2 kg)  05/23/23 180 lb (81.6 kg)  01/12/23 174 lb (78.9 kg)     ASSESSMENT AND PLAN:  Problem List Items Addressed This Visit       Cardiology Problems   Atrial fibrillation (HCC)   Relevant Orders   EKG 12-Lead (Completed)   CAD (coronary artery disease) - Primary   Relevant Orders   EKG 12-Lead (Completed)   Other Visit Diagnoses       Hyperlipidemia LDL goal <70         Essential hypertension       Relevant Orders   EKG 12-Lead (Completed)     Aortic atherosclerosis (HCC)       Relevant Orders   EKG 12-Lead (Completed)       Coronary artery disease with stable angina Calcium score of 1000, three-vessel coronary calcification noted FFR with nonobstructive disease Denies anginal symptoms,   Zetia 10 mg daily  pravastatin, cholesterol at goal Non-smoker, no diabetes No further testing at this time  Paroxysmal atrial fibrillation Prior ablation x 4 Prefers to stay on atenolol 25 daily with flecainide 50 twice daily started by EP EP recommended continuing flecainide despite coronary calcification as cardiac CTA with no severe stenosis  Hyperlipidemia Continue pravastatin 40 daily,  Zetia 10 mg daily, numbers at goal  Essential hypertension Blood pressure is well controlled on today's visit. No changes made to the medications.   Signed, Dossie Arbour, M.D., Ph.D. [facility] [place], [place] [phone]

## 2023-06-28 ENCOUNTER — Ambulatory Visit: Payer: Medicare Other | Attending: Cardiovascular Disease | Admitting: Cardiovascular Disease

## 2023-06-28 ENCOUNTER — Encounter: Payer: Self-pay | Admitting: Cardiovascular Disease

## 2023-06-28 VITALS — BP 128/68 | HR 83 | Ht 66.0 in | Wt 181.2 lb

## 2023-06-28 DIAGNOSIS — I25118 Atherosclerotic heart disease of native coronary artery with other forms of angina pectoris: Secondary | ICD-10-CM | POA: Insufficient documentation

## 2023-06-28 DIAGNOSIS — I700 Atherosclerosis of aorta: Secondary | ICD-10-CM | POA: Diagnosis not present

## 2023-06-28 DIAGNOSIS — I10 Essential (primary) hypertension: Secondary | ICD-10-CM | POA: Diagnosis not present

## 2023-06-28 DIAGNOSIS — I480 Paroxysmal atrial fibrillation: Secondary | ICD-10-CM | POA: Insufficient documentation

## 2023-06-28 DIAGNOSIS — E785 Hyperlipidemia, unspecified: Secondary | ICD-10-CM | POA: Insufficient documentation

## 2023-06-28 DIAGNOSIS — Z96612 Presence of left artificial shoulder joint: Secondary | ICD-10-CM | POA: Diagnosis not present

## 2023-06-28 MED ORDER — ATENOLOL 25 MG PO TABS: 25.0000 mg | ORAL_TABLET | Freq: Every day | ORAL | 3 refills | Status: AC

## 2023-06-28 MED ORDER — FLECAINIDE ACETATE 50 MG PO TABS: 50.0000 mg | ORAL_TABLET | Freq: Two times a day (BID) | ORAL | 3 refills | Status: AC

## 2023-06-28 MED ORDER — PRAVASTATIN SODIUM 40 MG PO TABS: 40.0000 mg | ORAL_TABLET | Freq: Every evening | ORAL | 3 refills | Status: AC

## 2023-06-28 MED ORDER — EZETIMIBE 10 MG PO TABS: 10.0000 mg | ORAL_TABLET | Freq: Every day | ORAL | 3 refills | Status: AC

## 2023-06-28 NOTE — Patient Instructions (Signed)

## 2023-07-05 DIAGNOSIS — Z96612 Presence of left artificial shoulder joint: Secondary | ICD-10-CM | POA: Diagnosis not present

## 2023-07-07 DIAGNOSIS — Z96612 Presence of left artificial shoulder joint: Secondary | ICD-10-CM | POA: Diagnosis not present

## 2023-07-08 ENCOUNTER — Encounter: Payer: Self-pay | Admitting: Primary Care

## 2023-07-08 ENCOUNTER — Ambulatory Visit: Payer: Medicare Other | Admitting: Primary Care

## 2023-07-08 VITALS — BP 128/72 | HR 79 | Temp 97.2°F | Ht 66.0 in | Wt 179.0 lb

## 2023-07-08 DIAGNOSIS — Z7985 Long-term (current) use of injectable non-insulin antidiabetic drugs: Secondary | ICD-10-CM

## 2023-07-08 DIAGNOSIS — E1165 Type 2 diabetes mellitus with hyperglycemia: Secondary | ICD-10-CM | POA: Diagnosis not present

## 2023-07-08 DIAGNOSIS — G4700 Insomnia, unspecified: Secondary | ICD-10-CM | POA: Diagnosis not present

## 2023-07-08 DIAGNOSIS — I480 Paroxysmal atrial fibrillation: Secondary | ICD-10-CM

## 2023-07-08 DIAGNOSIS — Z23 Encounter for immunization: Secondary | ICD-10-CM | POA: Diagnosis not present

## 2023-07-08 DIAGNOSIS — Z7984 Long term (current) use of oral hypoglycemic drugs: Secondary | ICD-10-CM | POA: Diagnosis not present

## 2023-07-08 DIAGNOSIS — I2510 Atherosclerotic heart disease of native coronary artery without angina pectoris: Secondary | ICD-10-CM | POA: Diagnosis not present

## 2023-07-08 DIAGNOSIS — E7849 Other hyperlipidemia: Secondary | ICD-10-CM

## 2023-07-08 DIAGNOSIS — E2839 Other primary ovarian failure: Secondary | ICD-10-CM

## 2023-07-08 DIAGNOSIS — K219 Gastro-esophageal reflux disease without esophagitis: Secondary | ICD-10-CM | POA: Diagnosis not present

## 2023-07-08 LAB — HEMOGLOBIN A1C: Hgb A1c MFr Bld: 5.7 % (ref 4.6–6.5)

## 2023-07-08 LAB — COMPREHENSIVE METABOLIC PANEL
ALT: 17 U/L (ref 0–35)
AST: 19 U/L (ref 0–37)
Albumin: 4.3 g/dL (ref 3.5–5.2)
Alkaline Phosphatase: 54 U/L (ref 39–117)
BUN: 14 mg/dL (ref 6–23)
CO2: 31 meq/L (ref 19–32)
Calcium: 9.3 mg/dL (ref 8.4–10.5)
Chloride: 103 meq/L (ref 96–112)
Creatinine, Ser: 0.75 mg/dL (ref 0.40–1.20)
GFR: 76.82 mL/min (ref >60.00)
Glucose, Bld: 92 mg/dL (ref 70–99)
Potassium: 4.4 meq/L (ref 3.5–5.1)
Sodium: 141 meq/L (ref 135–145)
Total Bilirubin: 0.5 mg/dL (ref 0.2–1.2)
Total Protein: 6.5 g/dL (ref 6.0–8.3)

## 2023-07-08 LAB — LIPID PANEL
Cholesterol: 106 mg/dL (ref 0–200)
HDL: 50.1 mg/dL (ref >39.00)
LDL Cholesterol: 37 mg/dL (ref 0–99)
NonHDL: 56.15
Total CHOL/HDL Ratio: 2
Triglycerides: 94 mg/dL (ref 0.0–149.0)
VLDL: 18.8 mg/dL (ref 0.0–40.0)

## 2023-07-08 LAB — CBC
HCT: 43.3 % (ref 36.0–46.0)
Hemoglobin: 14.4 g/dL (ref 12.0–15.0)
MCHC: 33.1 g/dL (ref 30.0–36.0)
MCV: 95.6 fL (ref 78.0–100.0)
Platelets: 316 10*3/uL (ref 150.0–400.0)
RBC: 4.53 Mil/uL (ref 3.87–5.11)
RDW: 12.9 % (ref 11.5–15.5)
WBC: 6.7 10*3/uL (ref 4.0–10.5)

## 2023-07-08 LAB — MICROALBUMIN / CREATININE URINE RATIO
Creatinine,U: 228 mg/dL
Microalb Creat Ratio: 12.2 mg/g (ref 0.0–30.0)
Microalb, Ur: 2.8 mg/dL — ABNORMAL HIGH (ref 0.0–1.9)

## 2023-07-08 MED ORDER — MIRTAZAPINE 15 MG PO TABS: 15.0000 mg | ORAL_TABLET | Freq: Every day | ORAL | 0 refills | Status: DC

## 2023-07-08 NOTE — Assessment & Plan Note (Signed)
 Rate and rhythm controlled today.  Following with cardiology, office notes reviewed from February 2025. Continue flecainide 50 mg twice daily, and atenolol 25 mg daily.

## 2023-07-08 NOTE — Progress Notes (Signed)
 Subjective:    Patient ID: Kelsey Weaver, female    DOB: [birth year], 78 y.o.   MRN: [id number]  HPI  Kelsey Weaver is a very pleasant 78 y.o. female with a history of atrial fibrillation, CAD, GERD, type 2 diabetes, diabetic neuropathy, osteoarthritis, type 2 diabetes, insomnia, hyperlipidemia, restless legs who presents today for follow-up of chronic conditions.  1) CAD/Atrial Fibrillation/Hyperlipidemia: Currently managed on aspirin 81 mg daily, atenolol 25 mg daily, flecainide 50 mg twice daily, pravastatin 40 mg daily, Zetia 10 mg daily.  Following with cardiology.  Last office visit was 06/28/2023 for her annual follow-up.  No changes were made to her regimen.  2) Type 2 Diabetes:  Current medications include: Metformin ER 500 mg daily, Ozempic 1 mg weekly  She is checking her blood glucose 0 times daily.  Last A1C: 5.4 in September 2024, pending today. Last Eye Exam: Up-to-date Last Foot Exam: Up-to-date Pneumonia Vaccination: Up-to-date Urine Microalbumin: Due Statin: Pravastatin  Dietary changes since last visit: Smaller portion sizes, less snacking.    Exercise: Walking  Wt Readings from Last 3 Encounters:  07/08/23 179 lb (81.2 kg)  06/28/23 181 lb 4 oz (82.2 kg)  05/23/23 180 lb (81.6 kg)     3) Insomnia: Currently managed on trazodone 150 mg at bedtime. She continues to experience difficulty staying asleep. She will wake up every 2 hours nearly every night. She denies mind racing thoughts. She drinks a diet coke in the morning, no caffeine in the afternoon. She does watch TV before bed.   She has tried Melatonin and other OTC products without improvement. She does snore during the night sometimes. Denies periods of apnea, daytime tiredness.   Review of Systems  Respiratory:  Negative for shortness of breath.   Cardiovascular:  Negative for chest pain.  Gastrointestinal:  Negative for constipation and diarrhea.  Neurological:  Negative for dizziness and  headaches.  Psychiatric/Behavioral:  Positive for sleep disturbance. The patient is not nervous/anxious.          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    Social History   Socioeconomic History   Marital status: Married    Spouse name: Not on file   Number of children: 3   Years of education: Not on file   Highest education level: Bachelor's degree (e.g., BA, AB, BS)  Occupational History   Occupation: Retired Charity fundraiser  Tobacco Use   Smoking status: Never   Smokeless tobacco: Never  Vaping Use   Vaping status: Never Used  Substance and Sexual Activity   Alcohol use: Yes    Alcohol/week: 0.0 standard drinks of alcohol    Comment: socially   Drug use: No   Sexual activity: Not on file  Other Topics Concern   Not on file  Social History Narrative   Pt recently moved to [place] with spouse 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    Once worked in L&D and NICU.    Son is a Engineer, civil (consulting) at [employer] and also works for [employer].   Enjoys spending time with her family.    Social Drivers of Corporate investment banker Strain: Low Risk  (07/04/2023)   Overall Financial Resource Strain (CARDIA)    Difficulty of Paying Living Expenses: Not hard at all  Food Insecurity: No Food Insecurity (07/04/2023)   Hunger Vital Sign    Worried About Running Out of Food in the Last Year: Never true    Ran Out of Food in the Last Year: Never true   Transportation Needs: No Transportation Needs (07/04/2023)   PRAPARE - Administrator, Civil Service (Medical): No    Lack of Transportation (Non-Medical): No  Physical Activity: Sufficiently Active (07/04/2023)   Exercise Vital Sign    Days of Exercise per Week: 2 days    Minutes of Exercise per Session: 120 min  Stress: No Stress Concern Present (07/04/2023)   [employer] of Occupational Health - Occupational Stress Questionnaire    Feeling of Stress : Not at all  Social Connections: Socially Integrated (07/04/2023)   Social Connection and Isolation Panel [NHANES]    Frequency of Communication with Friends and Family: More than three times a week    Frequency of Social Gatherings with Friends and Family: Twice a week    Attends Religious Services: More than 4 times per year    Active Member of [employer] or Organizations: Yes    Attends Banker Meetings: 1 to 4 times per year    Marital Status: Married  Catering manager Violence: Not At Risk (05/23/2023)   Humiliation, Afraid, Rape, and Kick questionnaire    Fear of Current or Ex-Partner: No    Emotionally Abused: No    Physically Abused: No    Sexually Abused: No    Past Surgical History:  Procedure Laterality Date   ABLATION  10/31/2007, 08/26/2008, 09/22/2012, 07/01/2015   AFib ablation x 4 in [place]   CATARACT EXTRACTION Bilateral    COLONOSCOPY     COLONOSCOPY WITH PROPOFOL N/A 05/27/2022   Procedure: COLONOSCOPY WITH PROPOFOL;  Surgeon: Midge Minium, MD;  Location: [facility];  Service: Endoscopy;  Laterality: N/A;   CYSTECTOMY     subsebacous x 2   DILATION AND CURETTAGE OF UTERUS  at age 53   EYE SURGERY     FOOT SURGERY Bilateral 2008, 2014   4 hammertoes and bunionectomy   implantable loop recorder pacement  01/19/13   MDT Reveal LINQ implanted in [place] for afib management   JOINT REPLACEMENT Left 07/2017   shoulder   KNEE SURGERY Left 2016   arthoscopy    LIGATION / DIVISION SAPHENOUS VEIN  Bilateral    POLYPECTOMY     REVERSE SHOULDER ARTHROPLASTY Left 08/02/2017   Procedure: TOTAL SHOULDER ARTHROPLASTY VS. REVERSE;  Surgeon: Christena Flake, MD;  Location: [facility];  Service: Orthopedics;  Laterality: Left;   TOTAL KNEE ARTHROPLASTY Left 06/08/2018   Procedure: TOTAL KNEE ARTHROPLASTY;  Surgeon: Christena Flake, MD;  Location: [facility];  Service: Orthopedics;  Laterality: Left;    Family History  Problem Relation Age of Onset   Hypertension Mother    Glaucoma Mother    CAD Father    Atrial fibrillation Father    Gallbladder disease Sister    Cholelithiasis Sister    CAD Brother    Hypertension Brother    Atrial fibrillation Brother  Hypercholesterolemia Brother    Heart disease Son 34       stent placement   Heart attack Son    Colon cancer Neg Hx    Breast cancer Neg Hx     Allergies  Allergen Reactions   Ambien [Zolpidem Tartrate] Other (See Comments)    Flu like symptoms    Compazine [Prochlorperazine Edisylate] Other (See Comments)    Aches and pains, generalized   Metoprolol Other (See Comments)    Depression  Depression     Depression   Prochlorperazine Other (See Comments)    Aches and pains, generalized   Metoclopramide Anxiety and Hives   Zolpidem Hives and Other (See Comments)    Flu like symptoms    Current Outpatient Medications on File Prior to Visit  Medication Sig Dispense Refill   aspirin EC 81 MG tablet Take 1 tablet (81 mg total) by mouth daily. Swallow whole. 90 tablet 3   atenolol (TENORMIN) 25 MG tablet Take 1 tablet (25 mg total) by mouth daily. 90 tablet 3   Cholecalciferol (VITAMIN D3) 125 MCG (5000 UT) CAPS Take 1 capsule by mouth daily.      Cyanocobalamin (VITAMIN B-12 PO) Take 1 tablet by mouth daily.     diclofenac sodium (VOLTAREN) 1 % GEL Apply 2 g topically 3 (three) times daily as needed (for knee pain).  1   ezetimibe (ZETIA) 10 MG tablet Take 1 tablet (10 mg total) by mouth daily. 90 tablet 3   famotidine (PEPCID) 20  MG tablet Take 20 mg by mouth at bedtime.      flecainide (TAMBOCOR) 50 MG tablet Take 1 tablet (50 mg total) by mouth 2 (two) times daily. 180 tablet 3   metFORMIN (GLUCOPHAGE-XR) 500 MG 24 hr tablet TAKE 1 TABLET BY MOUTH DAILY WITH BREAKFAST FOR DIABETES 90 tablet 0   Multiple Vitamins-Minerals (PRESERVISION AREDS 2) CAPS Take 1 tablet by mouth daily.      Omega-3 Fatty Acids (FISH OIL) 1200 MG CAPS daily.     pravastatin (PRAVACHOL) 40 MG tablet Take 1 tablet (40 mg total) by mouth every evening. 90 tablet 3   Semaglutide, 1 MG/DOSE, (OZEMPIC, 1 MG/DOSE,) 4 MG/3ML SOPN INJECT 1 MG SUBCUTANEOUSLY AS DIRECTED ONCE A WEEK. FOR DIABETES. 9 mL 0   Turmeric 500 MG TABS Take 500 mg by mouth daily.     No current facility-administered medications on file prior to visit.    BP 128/72   Pulse 79   Temp (!) 97.2 F (36.2 C) (Temporal)   Ht 5\' 6"  (1.676 m)   Wt 179 lb (81.2 kg)   SpO2 96%   BMI 28.89 kg/m  Objective:   Physical Exam Cardiovascular:     Rate and Rhythm: Normal rate and regular rhythm.  Pulmonary:     Effort: Pulmonary effort is normal.     Breath sounds: Normal breath sounds.  Musculoskeletal:     Cervical back: Neck supple.  Skin:    General: Skin is warm and dry.  Neurological:     Mental Status: She is alert and oriented to person, place, and time.  Psychiatric:        Mood and Affect: Mood normal.           Assessment & Plan:  Type 2 diabetes mellitus with hyperglycemia, without long-term current use of insulin (HCC) Assessment & Plan: Repeat A1c pending. Urine microalbumin due and pending.  Continue Ozempic 1 mg weekly, metformin ER 500 mg daily. Consider  discontinuation of metformin if A1c is stable.  Follow-up in 6 months.  Orders: -     Microalbumin / creatinine urine ratio -     CBC -     Hemoglobin A1c  Other hyperlipidemia Assessment & Plan: Repeat lipid panel pending. Continue Zetia 10 mg daily, pravastatin 40 mg daily.  Orders: -      CBC -     Comprehensive metabolic panel -     Lipid panel  Estrogen deficiency -     DG Bone Density; Future  Paroxysmal atrial fibrillation (HCC) Assessment & Plan: Rate and rhythm controlled today.  Following with cardiology, office notes reviewed from February 2025. Continue flecainide 50 mg twice daily, and atenolol 25 mg daily.     Coronary artery disease involving native coronary artery of native heart without angina pectoris Assessment & Plan: Asymptomatic, following with cardiology, office notes reviewed from February 2025.  Continue diabetes control, lipid control, aspirin 81 mg daily   Gastroesophageal reflux disease, unspecified whether esophagitis present Assessment & Plan: Controlled.  Continue famotidine 20 mg daily.   Insomnia, unspecified type Assessment & Plan: Uncontrolled.  We did discuss healthy hygiene habits such as refraining from watching TV at bedtime. Wean off trazodone.  Start mirtazapine 15 mg at bedtime. She will update.  Stop bang score with low risk today  Orders: -     Mirtazapine; Take 1 tablet (15 mg total) by mouth at bedtime. For sleep  Dispense: 90 tablet; Refill: 0  Encounter for immunization -     Pneumococcal conjugate vaccine 20-valent        Doreene Nest, NP

## 2023-07-08 NOTE — Assessment & Plan Note (Addendum)
 Uncontrolled.  We did discuss healthy hygiene habits such as refraining from watching TV at bedtime. Wean off trazodone.  Start mirtazapine 15 mg at bedtime. She will update.  Stop bang score with low risk today

## 2023-07-08 NOTE — Assessment & Plan Note (Signed)
 Repeat A1c pending. Urine microalbumin due and pending.  Continue Ozempic 1 mg weekly, metformin ER 500 mg daily. Consider discontinuation of metformin if A1c is stable.  Follow-up in 6 months.

## 2023-07-08 NOTE — Patient Instructions (Signed)
 Wean off of the trazodone try cutting the tablet in half, take 1/2 tablet by mouth for 2 weeks, then discontinue.  Start mirtazapine 15 mg at bedtime for sleep.  Stop by the lab prior to leaving today. I will notify you of your results once received.   Call the breast center to schedule your bone density scan  Please schedule a follow up visit for 6 months for a diabetes check.  It was a pleasure to see you today!

## 2023-07-08 NOTE — Assessment & Plan Note (Signed)
 Repeat lipid panel pending. Continue Zetia 10 mg daily, pravastatin 40 mg daily.

## 2023-07-08 NOTE — Assessment & Plan Note (Signed)
 Controlled. ? ?Continue famotidine 20 mg daily.  ?

## 2023-07-08 NOTE — Assessment & Plan Note (Signed)
 Asymptomatic, following with cardiology, office notes reviewed from February 2025.  Continue diabetes control, lipid control, aspirin 81 mg daily

## 2023-07-12 DIAGNOSIS — Z96612 Presence of left artificial shoulder joint: Secondary | ICD-10-CM | POA: Diagnosis not present

## 2023-07-14 DIAGNOSIS — Z96612 Presence of left artificial shoulder joint: Secondary | ICD-10-CM | POA: Diagnosis not present

## 2023-07-19 DIAGNOSIS — Z96612 Presence of left artificial shoulder joint: Secondary | ICD-10-CM | POA: Diagnosis not present

## 2023-07-21 DIAGNOSIS — Z96612 Presence of left artificial shoulder joint: Secondary | ICD-10-CM | POA: Diagnosis not present

## 2023-07-26 DIAGNOSIS — Z96612 Presence of left artificial shoulder joint: Secondary | ICD-10-CM | POA: Diagnosis not present

## 2023-07-27 DIAGNOSIS — L570 Actinic keratosis: Secondary | ICD-10-CM | POA: Diagnosis not present

## 2023-07-28 DIAGNOSIS — M25612 Stiffness of left shoulder, not elsewhere classified: Secondary | ICD-10-CM | POA: Diagnosis not present

## 2023-07-28 DIAGNOSIS — Z96612 Presence of left artificial shoulder joint: Secondary | ICD-10-CM | POA: Diagnosis not present

## 2023-08-02 DIAGNOSIS — Z96612 Presence of left artificial shoulder joint: Secondary | ICD-10-CM | POA: Diagnosis not present

## 2023-08-04 DIAGNOSIS — Z96612 Presence of left artificial shoulder joint: Secondary | ICD-10-CM | POA: Diagnosis not present

## 2023-08-09 DIAGNOSIS — Z96612 Presence of left artificial shoulder joint: Secondary | ICD-10-CM | POA: Diagnosis not present

## 2023-08-09 DIAGNOSIS — M25612 Stiffness of left shoulder, not elsewhere classified: Secondary | ICD-10-CM | POA: Diagnosis not present

## 2023-08-11 DIAGNOSIS — Z96612 Presence of left artificial shoulder joint: Secondary | ICD-10-CM | POA: Diagnosis not present

## 2023-08-16 DIAGNOSIS — Z96612 Presence of left artificial shoulder joint: Secondary | ICD-10-CM | POA: Diagnosis not present

## 2023-08-17 DIAGNOSIS — M7062 Trochanteric bursitis, left hip: Secondary | ICD-10-CM | POA: Diagnosis not present

## 2023-08-17 DIAGNOSIS — M461 Sacroiliitis, not elsewhere classified: Secondary | ICD-10-CM | POA: Diagnosis not present

## 2023-08-17 DIAGNOSIS — Z96612 Presence of left artificial shoulder joint: Secondary | ICD-10-CM | POA: Diagnosis not present

## 2023-08-18 DIAGNOSIS — Z96612 Presence of left artificial shoulder joint: Secondary | ICD-10-CM | POA: Diagnosis not present

## 2023-08-23 DIAGNOSIS — Z96612 Presence of left artificial shoulder joint: Secondary | ICD-10-CM | POA: Diagnosis not present

## 2023-08-30 ENCOUNTER — Other Ambulatory Visit: Payer: Self-pay | Admitting: Primary Care

## 2023-08-30 DIAGNOSIS — Z96612 Presence of left artificial shoulder joint: Secondary | ICD-10-CM | POA: Diagnosis not present

## 2023-08-30 DIAGNOSIS — M25612 Stiffness of left shoulder, not elsewhere classified: Secondary | ICD-10-CM | POA: Diagnosis not present

## 2023-08-30 DIAGNOSIS — E1165 Type 2 diabetes mellitus with hyperglycemia: Secondary | ICD-10-CM

## 2023-09-01 DIAGNOSIS — Z96612 Presence of left artificial shoulder joint: Secondary | ICD-10-CM | POA: Diagnosis not present

## 2023-09-15 DIAGNOSIS — H26493 Other secondary cataract, bilateral: Secondary | ICD-10-CM | POA: Diagnosis not present

## 2023-09-19 ENCOUNTER — Other Ambulatory Visit: Payer: Self-pay | Admitting: Primary Care

## 2023-09-19 DIAGNOSIS — E1165 Type 2 diabetes mellitus with hyperglycemia: Secondary | ICD-10-CM

## 2023-09-19 NOTE — Telephone Encounter (Signed)
 lvm for pt to call office to schedule appt.

## 2023-09-19 NOTE — Telephone Encounter (Signed)
 Patient is due for diabetes follow up in August, this will be required prior to any further refills.  Please schedule, thank you!

## 2023-09-20 NOTE — Telephone Encounter (Signed)
 lvm for pt to call office to schedule appt.

## 2023-09-30 ENCOUNTER — Other Ambulatory Visit: Payer: Self-pay | Admitting: Primary Care

## 2023-09-30 DIAGNOSIS — G4700 Insomnia, unspecified: Secondary | ICD-10-CM

## 2023-10-10 ENCOUNTER — Other Ambulatory Visit (INDEPENDENT_AMBULATORY_CARE_PROVIDER_SITE_OTHER)

## 2023-10-10 DIAGNOSIS — E1165 Type 2 diabetes mellitus with hyperglycemia: Secondary | ICD-10-CM

## 2023-10-11 ENCOUNTER — Ambulatory Visit: Payer: Self-pay | Admitting: Primary Care

## 2023-10-11 LAB — HEMOGLOBIN A1C: Hgb A1c MFr Bld: 5.8 % (ref 4.6–6.5)

## 2023-11-13 ENCOUNTER — Other Ambulatory Visit: Payer: Self-pay | Admitting: Primary Care

## 2023-11-13 DIAGNOSIS — E1165 Type 2 diabetes mellitus with hyperglycemia: Secondary | ICD-10-CM

## 2023-11-14 DIAGNOSIS — E1165 Type 2 diabetes mellitus with hyperglycemia: Secondary | ICD-10-CM

## 2023-11-14 MED ORDER — OZEMPIC (1 MG/DOSE) 4 MG/3ML ~~LOC~~ SOPN: 2.0000 mg | PEN_INJECTOR | SUBCUTANEOUS | 0 refills | Status: DC

## 2023-11-15 ENCOUNTER — Other Ambulatory Visit: Payer: Self-pay | Admitting: Primary Care

## 2023-11-15 DIAGNOSIS — E1165 Type 2 diabetes mellitus with hyperglycemia: Secondary | ICD-10-CM

## 2024-01-05 ENCOUNTER — Ambulatory Visit: Admitting: Primary Care

## 2024-01-11 ENCOUNTER — Ambulatory Visit
Admission: RE | Admit: 2024-01-11 | Discharge: 2024-01-11 | Disposition: A | Discharge status: Home / Self Care | Source: Ambulatory Visit | Attending: Primary Care | Admitting: Primary Care

## 2024-01-11 DIAGNOSIS — E2839 Other primary ovarian failure: Secondary | ICD-10-CM | POA: Insufficient documentation

## 2024-01-11 DIAGNOSIS — Z780 Asymptomatic menopausal state: Secondary | ICD-10-CM | POA: Diagnosis not present

## 2024-01-12 ENCOUNTER — Ambulatory Visit: Payer: Self-pay | Admitting: Primary Care

## 2024-01-16 DIAGNOSIS — S76012S Strain of muscle, fascia and tendon of left hip, sequela: Secondary | ICD-10-CM | POA: Diagnosis not present

## 2024-01-16 DIAGNOSIS — M7632 Iliotibial band syndrome, left leg: Secondary | ICD-10-CM | POA: Diagnosis not present

## 2024-01-16 DIAGNOSIS — M7062 Trochanteric bursitis, left hip: Secondary | ICD-10-CM | POA: Diagnosis not present

## 2024-01-16 DIAGNOSIS — M5416 Radiculopathy, lumbar region: Secondary | ICD-10-CM | POA: Diagnosis not present

## 2024-01-18 ENCOUNTER — Ambulatory Visit: Payer: Self-pay | Admitting: Primary Care

## 2024-01-18 ENCOUNTER — Encounter: Payer: Self-pay | Admitting: Primary Care

## 2024-01-18 ENCOUNTER — Ambulatory Visit: Admitting: Primary Care

## 2024-01-18 ENCOUNTER — Ambulatory Visit (INDEPENDENT_AMBULATORY_CARE_PROVIDER_SITE_OTHER)
Admission: RE | Admit: 2024-01-18 | Discharge: 2024-01-18 | Disposition: A | Discharge status: Home / Self Care | Source: Ambulatory Visit | Attending: Primary Care | Admitting: Primary Care

## 2024-01-18 VITALS — BP 118/56 | HR 65 | Temp 97.8°F | Ht 66.0 in | Wt 186.0 lb

## 2024-01-18 DIAGNOSIS — M19072 Primary osteoarthritis, left ankle and foot: Secondary | ICD-10-CM | POA: Diagnosis not present

## 2024-01-18 DIAGNOSIS — M79672 Pain in left foot: Secondary | ICD-10-CM

## 2024-01-18 DIAGNOSIS — M7732 Calcaneal spur, left foot: Secondary | ICD-10-CM | POA: Diagnosis not present

## 2024-01-18 DIAGNOSIS — M2042 Other hammer toe(s) (acquired), left foot: Secondary | ICD-10-CM | POA: Diagnosis not present

## 2024-01-18 DIAGNOSIS — G8929 Other chronic pain: Secondary | ICD-10-CM | POA: Diagnosis not present

## 2024-01-18 DIAGNOSIS — E1165 Type 2 diabetes mellitus with hyperglycemia: Secondary | ICD-10-CM

## 2024-01-18 DIAGNOSIS — Z7985 Long-term (current) use of injectable non-insulin antidiabetic drugs: Secondary | ICD-10-CM

## 2024-01-18 DIAGNOSIS — Z23 Encounter for immunization: Secondary | ICD-10-CM

## 2024-01-18 DIAGNOSIS — M7662 Achilles tendinitis, left leg: Secondary | ICD-10-CM | POA: Diagnosis not present

## 2024-01-18 LAB — POCT GLYCOSYLATED HEMOGLOBIN (HGB A1C): Hemoglobin A1C: 5.4 % (ref 4.0–5.6)

## 2024-01-18 NOTE — Assessment & Plan Note (Addendum)
 Unclear, could be arthritis. Need to rule out problem from prior surgery.  X-ray ordered to rule out injury and surgical hardware.   Referral to podiatry for further treatment.   She will update.  I evaluated patient, was consulted regarding treatment, and agree with assessment and plan per Micky Overturf, MSN, FNP student.   Mallie Gaskins, NP-C

## 2024-01-18 NOTE — Assessment & Plan Note (Addendum)
 Controlled.   Continue taking Semaglutide  2mg /week. Remain of metformin .  Foot exam today.  Follow up in 6 months.   I evaluated patient, was consulted regarding treatment, and agree with assessment and plan per Abrahm Mancia, MSN, FNP student.   Mallie Gaskins, NP-C

## 2024-01-18 NOTE — Progress Notes (Signed)
 Subjective:    Patient ID: Kelsey Weaver, female    DOB: [birth year], 78 y.o.   MRN: [id number]  Kelsey Weaver is a very pleasant 78 y.o. female with a history of type 2 diabetes, CAD, atrial fibrillation, hyperlipidemia who presents today for follow-up diabetes. She would also like to discuss toe pain.   1) Type 2 Diabetes:   Current medications include: Ozempic  2 mg weekly. Tolerating well.   She is checking her blood glucose 0 times daily.   Last A1C: 5.8 in June 2025, 5.4 today Last Eye Exam: Due Last Foot Exam: Due Pneumonia Vaccination: 2025 Urine Microalbumin: Up-to-date Statin: Pravastatin   Dietary changes since last visit: Eating veggies and fruit, lean protein. No fried food.   Exercise: None  BP Readings from Last 3 Encounters:  01/18/24 (!) 118/56  07/08/23 128/72  06/28/23 128/68   Wt Readings from Last 3 Encounters:  01/18/24 186 lb (84.4 kg)  07/08/23 179 lb (81.2 kg)  06/28/23 181 lb 4 oz (82.2 kg)   2) Toe Pain: Chronic history with pain to the dorsal second toe on the left foot. History of hammer toe repair and bunionectomy about 10 years ago. She's had increased pain to the toe since the surgery. She's been taking pieces of Tramadol  and hydrocodone  at times to help her sleep.   She is requesting further evaluation or pain management.  He has not seen podiatry in years.   Review of Systems  Eyes:  Negative for visual disturbance.  Respiratory:  Negative for shortness of breath.   Gastrointestinal:  Negative for constipation and diarrhea.  Musculoskeletal:  Positive for arthralgias.  Neurological:  Negative for numbness.         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    Social History   Socioeconomic History   Marital status: Married    Spouse name: Not on file   Number of children: 3   Years of education: Not on file   Highest education level: Bachelor's degree (e.g., BA, AB, BS)  Occupational History   Occupation: Retired Charity fundraiser  Tobacco Use   Smoking status: Never   Smokeless tobacco: Never  Vaping Use   Vaping status: Never Used  Substance and Sexual Activity   Alcohol use: Yes    Alcohol/week: 0.0 standard drinks of alcohol    Comment: socially   Drug use: No   Sexual activity: Not on file  Other Topics Concern   Not on file  Social History Narrative   Pt recently moved to [place] with spouse 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    Once worked in L&D and NICU.    Son is a Engineer, civil (consulting) at [employer] and also works for [employer].   Enjoys spending time with her family.    Social Drivers of Health   Financial Resource Strain: Low Risk  (01/13/2024)   Overall Financial  Resource Strain (CARDIA)    Difficulty of Paying Living Expenses: Not hard at all  Food Insecurity: No Food Insecurity (01/13/2024)   Hunger Vital Sign    Worried About Running Out of Food in the Last Year: Never true    Ran Out of Food in the Last Year: Never true  Transportation Needs: No Transportation Needs (01/13/2024)   PRAPARE - Administrator, Civil Service (Medical): No    Lack of Transportation (Non-Medical): No  Physical Activity: Insufficiently Active (01/13/2024)   Exercise Vital Sign    Days of Exercise per Week: 2 days    Minutes of Exercise per Session: 60 min  Stress: No Stress Concern Present (01/13/2024)   [employer] of Occupational Health - Occupational Stress  Questionnaire    Feeling of Stress: Only a little  Social Connections: Socially Integrated (01/13/2024)   Social Connection and Isolation Panel    Frequency of Communication with Friends and Family: More than three times a week    Frequency of Social Gatherings with Friends and Family: Once a week    Attends Religious Services: More than 4 times per year    Active Member of Clubs or Organizations: Yes    Attends Banker Meetings: More than 4 times per year    Marital Status: Married  Catering manager Violence: Not At Risk (05/23/2023)   Humiliation, Afraid, Rape, and Kick questionnaire    Fear of Current or Ex-Partner: No    Emotionally Abused: No    Physically Abused: No    Sexually Abused: No    Past Surgical History:  Procedure Laterality Date   ABLATION  10/31/2007, 08/26/2008, 09/22/2012, 07/01/2015   AFib ablation x 4 in [place]   CATARACT EXTRACTION Bilateral    COLONOSCOPY     COLONOSCOPY WITH PROPOFOL  N/A 05/27/2022   Procedure: COLONOSCOPY WITH PROPOFOL ;  Surgeon: Jinny Carmine, MD;  Location: [facility] ENDOSCOPY;  Service: Endoscopy;  Laterality: N/A;   CYSTECTOMY     subsebacous x 2   DILATION AND CURETTAGE OF UTERUS  at age 16   EYE SURGERY     FOOT SURGERY Bilateral 2008, 2014   4 hammertoes and bunionectomy   implantable loop recorder pacement  01/19/13   MDT Reveal LINQ implanted in [place] for afib management   JOINT REPLACEMENT Left 07/2017   shoulder   KNEE SURGERY Left 2016   arthoscopy    LIGATION / DIVISION SAPHENOUS VEIN Bilateral    POLYPECTOMY     REVERSE SHOULDER ARTHROPLASTY Left 08/02/2017   Procedure: TOTAL SHOULDER ARTHROPLASTY VS. REVERSE;  Surgeon: Edie Norleen PARAS, MD;  Location: [facility] ORS;  Service: Orthopedics;  Laterality: Left;   TOTAL KNEE ARTHROPLASTY Left 06/08/2018   Procedure: TOTAL KNEE ARTHROPLASTY;  Surgeon: Edie Norleen PARAS, MD;  Location: [facility] ORS;  Service: Orthopedics;  Laterality: Left;    Family History  Problem Relation Age of Onset    Hypertension Mother    Glaucoma Mother    CAD Father    Atrial fibrillation Father    Gallbladder disease Sister    Cholelithiasis Sister    CAD Brother    Hypertension Brother    Atrial fibrillation Brother    Hypercholesterolemia Brother    Heart disease Son 57       stent placement   Heart attack Son    Colon cancer Neg Hx    Breast cancer Neg Hx     Allergies  Allergen Reactions   Ambien [Zolpidem Tartrate] Other (See  Comments)    Flu like symptoms    Compazine [Prochlorperazine Edisylate] Other (See Comments)    Aches and pains, generalized   Metoprolol Other (See Comments)    Depression  Depression     Depression   Prochlorperazine Other (See Comments)    Aches and pains, generalized   Metoclopramide  Anxiety and Hives   Zolpidem Hives and Other (See Comments)    Flu like symptoms    Current Outpatient Medications on File Prior to Visit  Medication Sig Dispense Refill   aspirin  EC 81 MG tablet Take 1 tablet (81 mg total) by mouth daily. Swallow whole. 90 tablet 3   atenolol  (TENORMIN ) 25 MG tablet Take 1 tablet (25 mg total) by mouth daily. 90 tablet 3   Cholecalciferol  (VITAMIN D3) 125 MCG (5000 UT) CAPS Take 1 capsule by mouth daily.      diclofenac sodium (VOLTAREN) 1 % GEL Apply 2 g topically 3 (three) times daily as needed (for knee pain).  1   ezetimibe  (ZETIA ) 10 MG tablet Take 1 tablet (10 mg total) by mouth daily. 90 tablet 3   famotidine  (PEPCID ) 20 MG tablet Take 20 mg by mouth at bedtime.      flecainide  (TAMBOCOR ) 50 MG tablet Take 1 tablet (50 mg total) by mouth 2 (two) times daily. 180 tablet 3   Multiple Vitamins-Minerals (PRESERVISION AREDS 2) CAPS Take 1 tablet by mouth daily.      Omega-3 Fatty Acids (FISH OIL) 1200 MG CAPS daily.     pravastatin  (PRAVACHOL ) 40 MG tablet Take 1 tablet (40 mg total) by mouth every evening. 90 tablet 3   Semaglutide , 2 MG/DOSE, 8 MG/3ML SOPN Inject 2 mg as directed once a week. for diabetes. 9 mL 0   Turmeric 500  MG TABS Take 500 mg by mouth daily.     Cyanocobalamin  (VITAMIN B-12 PO) Take 1 tablet by mouth daily. (Patient not taking: Reported on 01/18/2024)     mirtazapine  (REMERON ) 15 MG tablet TAKE 1 TABLET (15 MG TOTAL) BY MOUTH AT BEDTIME. FOR SLEEP (Patient not taking: Reported on 01/18/2024) 90 tablet 2   No current facility-administered medications on file prior to visit.    BP (!) 118/56   Pulse 65   Temp 97.8 F (36.6 C) (Temporal)   Ht 5' 6 (1.676 m)   Wt 186 lb (84.4 kg)   SpO2 99%   BMI 30.02 kg/m  Objective:   Physical Exam Cardiovascular:     Rate and Rhythm: Normal rate and regular rhythm.  Pulmonary:     Effort: Pulmonary effort is normal.     Breath sounds: Normal breath sounds.  Musculoskeletal:     Cervical back: Neck supple.  Skin:    General: Skin is warm and dry.  Neurological:     Mental Status: She is alert and oriented to person, place, and time.  Psychiatric:        Mood and Affect: Mood normal.     Physical Exam        Assessment & Plan:  Type 2 diabetes mellitus with hyperglycemia, without long-term current use of insulin  (HCC) Assessment & Plan: Controlled.   Continue taking Semaglutide  2mg /week. Remain of metformin .  Foot exam today.  Follow up in 6 months.   I evaluated patient, was consulted regarding treatment, and agree with assessment and plan per Kristin Rudd, MSN, FNP student.   Mallie Gaskins, NP-C   Orders: -     POCT glycosylated hemoglobin (Hb A1C)  Encounter for immunization -     Flu vaccine HIGH DOSE PF(Fluzone Trivalent)  Chronic foot pain, left Assessment & Plan: Unclear, could be arthritis. Need to rule out problem from prior surgery.  X-ray ordered to rule out injury and surgical hardware.   Referral to podiatry for further treatment.   She will update.  I evaluated patient, was consulted regarding treatment, and agree with assessment and plan per Kristin Rudd, MSN, FNP student.   Mallie Gaskins,  NP-C   Orders: -     DG Foot Complete Left -     Ambulatory referral to Podiatry    Assessment and Plan Assessment & Plan         Comer MARLA Gaskins, NP    History of Present Illness

## 2024-01-18 NOTE — Progress Notes (Signed)
   Established Patient Office Visit  Subjective   Patient ID: Kelsey Weaver, female    DOB: 1945-09-28  Age: 78 y.o. MRN: [id number]  No chief complaint on file.   HPI  Mrs. Burleigh is a 78 year old female with history of type 2 diabetes, diabetic neuropathy, and coronary artery disease here for follow-up and toe pain.  1.) Diabetes  Current medications include: Semaglutide  2 mg/week.  Not checking glucose at home, has equipment. Last A1C: 5.8 in June 2025. Today 5.4. Last Eye Exam:Beginning of the year Last Foot Exam: 01/18/24 Pneumonia Vaccination: Urine Microalbumin: 2.8 Statin: Pravastatin  40 mg daily  Dietary changes since last visit: Eating fruits and vegetables. Staying away from fried food.   Exercise: 2 days a week, walking.  2.) Toe Pain  Pain to the dorsal 2nd toe on left foot that started years ago, progressively getting worse. History of hammer toe with surgery and bunionectomy on left foot. Pain is worse at night. Has been taking pieces of tramadol  and hydrocodone  at times to help with pain with no relief.    Review of Systems  Constitutional: Negative.   HENT: Negative.    Eyes: Negative.   Respiratory: Negative.    Cardiovascular: Negative.   Gastrointestinal: Negative.   Skin: Negative.   Neurological: Negative.   Endo/Heme/Allergies: Negative.         Objective:     There were no vitals taken for this visit.   Physical Exam HENT:     Head: Normocephalic.  Eyes:     Extraocular Movements: Extraocular movements intact.     Pupils: Pupils are equal, round, and reactive to light.  Cardiovascular:     Rate and Rhythm: Normal rate and regular rhythm.     Pulses: Normal pulses.     Heart sounds: Normal heart sounds.  Pulmonary:     Effort: Pulmonary effort is normal.  Abdominal:     General: Bowel sounds are normal.     Palpations: Abdomen is soft.  Skin:    Capillary Refill: Capillary refill takes less than 2 seconds.  Neurological:      Mental Status: She is alert and oriented to person, place, and time.      No results found for any visits on 01/18/24.    The ASCVD Risk score (Arnett DK, et al., 2019) failed to calculate for the following reasons:   The valid total cholesterol range is 130 to 320 mg/dL    Assessment & Plan:   Problem List Items Addressed This Visit   None   No follow-ups on file.    Glynn Yepes, RN

## 2024-01-18 NOTE — Patient Instructions (Addendum)
 Continue taking Ozempic  2mg /week.  X-ray today of second toe pain on left foot. Pending results.  Referral made to podiatry for further evaluation.  Follow-up in 6 months.

## 2024-01-30 ENCOUNTER — Ambulatory Visit (INDEPENDENT_AMBULATORY_CARE_PROVIDER_SITE_OTHER): Admitting: Podiatry

## 2024-01-30 ENCOUNTER — Ambulatory Visit (INDEPENDENT_AMBULATORY_CARE_PROVIDER_SITE_OTHER)

## 2024-01-30 DIAGNOSIS — G5792 Unspecified mononeuropathy of left lower limb: Secondary | ICD-10-CM | POA: Diagnosis not present

## 2024-01-30 DIAGNOSIS — M19072 Primary osteoarthritis, left ankle and foot: Secondary | ICD-10-CM

## 2024-01-30 NOTE — Progress Notes (Signed)
 Subjective:  Patient ID: Kelsey Weaver, female    DOB: 1945-07-03,  MRN: [id number] HPI Chief Complaint  Patient presents with   Foot Pain    Left foot pain x 2 months. 2nd toe pain scale  9/10 a night. She had several surgeries in [place] years ago.     78 y.o. female presents with the above complaint.   ROS: She denies fever chills nausea mobic muscle aches pains calf pain back pain chest pain shortness of breath.  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   Past Surgical History:  Procedure Laterality Date   ABLATION  10/31/2007, 08/26/2008, 09/22/2012, 07/01/2015   AFib ablation x 4 in [place]   CATARACT EXTRACTION Bilateral    COLONOSCOPY     COLONOSCOPY WITH PROPOFOL  N/A 05/27/2022   Procedure: COLONOSCOPY WITH PROPOFOL ;  Surgeon: Jinny Carmine, MD;  Location: [facility] ENDOSCOPY;  Service: Endoscopy;  Laterality: N/A;   CYSTECTOMY     subsebacous x 2   DILATION AND CURETTAGE OF UTERUS  at age 54   EYE SURGERY     FOOT SURGERY Bilateral 2008, 2014   4 hammertoes and bunionectomy   implantable loop recorder pacement  01/19/13   MDT Reveal LINQ implanted in [place] for afib management   JOINT REPLACEMENT Left 07/2017   shoulder   KNEE SURGERY Left 2016   arthoscopy    LIGATION / DIVISION SAPHENOUS VEIN Bilateral    POLYPECTOMY     REVERSE SHOULDER ARTHROPLASTY Left 08/02/2017   Procedure: TOTAL SHOULDER ARTHROPLASTY VS. REVERSE;   Surgeon: Edie Norleen PARAS, MD;  Location: [facility] ORS;  Service: Orthopedics;  Laterality: Left;   TOTAL KNEE ARTHROPLASTY Left 06/08/2018   Procedure: TOTAL KNEE ARTHROPLASTY;  Surgeon: Edie Norleen PARAS, MD;  Location: [facility] ORS;  Service: Orthopedics;  Laterality: Left;    Current Outpatient Medications:    aspirin  EC 81 MG tablet, Take 1 tablet (81 mg total) by mouth daily. Swallow whole., Disp: 90 tablet, Rfl: 3   atenolol  (TENORMIN ) 25 MG tablet, Take 1 tablet (25 mg total) by mouth daily., Disp: 90 tablet, Rfl: 3   Cholecalciferol  (VITAMIN D3) 125 MCG (5000 UT) CAPS, Take 1 capsule by mouth daily. , Disp: , Rfl:    Cyanocobalamin  (VITAMIN B-12 PO), Take 1 tablet by mouth daily., Disp: , Rfl:    ezetimibe  (ZETIA ) 10 MG tablet, Take 1 tablet (10 mg total) by mouth daily., Disp: 90 tablet, Rfl: 3   famotidine  (PEPCID ) 20 MG tablet, Take 20 mg by mouth at bedtime. , Disp: , Rfl:    flecainide  (TAMBOCOR ) 50 MG tablet, Take 1 tablet (50 mg total) by mouth 2 (two) times daily., Disp: 180 tablet, Rfl: 3   Multiple Vitamins-Minerals (PRESERVISION AREDS 2) CAPS, Take 1 tablet by mouth daily. , Disp: , Rfl:    Omega-3 Fatty Acids (FISH OIL) 1200 MG CAPS, daily., Disp: , Rfl:  pravastatin  (PRAVACHOL ) 40 MG tablet, Take 1 tablet (40 mg total) by mouth every evening., Disp: 90 tablet, Rfl: 3   Semaglutide , 2 MG/DOSE, 8 MG/3ML SOPN, Inject 2 mg as directed once a week. for diabetes., Disp: 9 mL, Rfl: 0   Turmeric 500 MG TABS, Take 500 mg by mouth daily., Disp: , Rfl:    diclofenac sodium (VOLTAREN) 1 % GEL, Apply 2 g topically 3 (three) times daily as needed (for knee pain)., Disp: , Rfl: 1   mirtazapine  (REMERON ) 15 MG tablet, TAKE 1 TABLET (15 MG TOTAL) BY MOUTH AT BEDTIME. FOR SLEEP (Patient not taking: Reported on 01/18/2024), Disp: 90 tablet, Rfl: 2  Allergies  Allergen Reactions   Ambien [Zolpidem Tartrate] Other (See Comments)    Flu like symptoms    Compazine [Prochlorperazine Edisylate] Other (See  Comments)    Aches and pains, generalized   Metoprolol Other (See Comments)    Depression  Depression     Depression   Prochlorperazine Other (See Comments)    Aches and pains, generalized   Metoclopramide  Anxiety and Hives   Zolpidem Hives and Other (See Comments)    Flu like symptoms   Review of Systems Objective:  There were no vitals filed for this visit.  General: Well developed, nourished, in no acute distress, alert and oriented x3   Dermatological: Skin is warm, dry and supple bilateral. Nails x 10 are well maintained; remaining integument appears unremarkable at this time. There are no open sores, no preulcerative lesions, no rash or signs of infection present.  Vascular: Dorsalis Pedis artery and Posterior Tibial artery pedal pulses are 2/4 bilateral with immedate capillary fill time. Pedal hair growth present. No varicosities and no lower extremity edema present bilateral.   Neruologic: Grossly intact via light touch bilateral. Vibratory intact via tuning fork bilateral. Protective threshold with Semmes Wienstein monofilament intact to all pedal sites bilateral. Patellar and Achilles deep tendon reflexes 2+ bilateral. No Babinski or clonus noted bilateral.   Musculoskeletal: No gross boney pedal deformities bilateral. No pain, crepitus, or limitation noted with foot and ankle range of motion bilateral. Muscular strength 5/5 in all groups tested bilateral.  Fused first metatarsal phalangeal joint left and fused PIPJ second digit left.  Overlying the DIPJ area there appears to be sort of a purpleish hue that very well could be a glomus tumor or neuroma.  She has severe pain on palpation of this area during the day and worse at night.  Gait: Unassisted, Nonantalgic.    Radiographs:  Radiographs indicate the fusion of the first metatarsophalangeal joint and lesser digits left.  Assessment & Plan:   Assessment: Neuritis second digit left foot DIPJ  Plan: I injected 4%  dehydrated alcohol today just proximal to the point of interest.  Follow-up with her in 2 weeks for another injection.     Jishnu Jenniges T. [place], [place]

## 2024-02-01 DIAGNOSIS — M25552 Pain in left hip: Secondary | ICD-10-CM | POA: Diagnosis not present

## 2024-02-01 DIAGNOSIS — M6281 Muscle weakness (generalized): Secondary | ICD-10-CM | POA: Diagnosis not present

## 2024-02-07 DIAGNOSIS — M6281 Muscle weakness (generalized): Secondary | ICD-10-CM | POA: Diagnosis not present

## 2024-02-07 DIAGNOSIS — M25552 Pain in left hip: Secondary | ICD-10-CM | POA: Diagnosis not present

## 2024-02-08 ENCOUNTER — Other Ambulatory Visit: Payer: Self-pay | Admitting: Primary Care

## 2024-02-08 DIAGNOSIS — E1165 Type 2 diabetes mellitus with hyperglycemia: Secondary | ICD-10-CM

## 2024-02-08 NOTE — Telephone Encounter (Signed)
 Patient is due for follow up in February 2026, this will be required prior to any further refills.  Please schedule, thank you!

## 2024-02-14 DIAGNOSIS — M6281 Muscle weakness (generalized): Secondary | ICD-10-CM | POA: Diagnosis not present

## 2024-02-14 DIAGNOSIS — M25552 Pain in left hip: Secondary | ICD-10-CM | POA: Diagnosis not present

## 2024-02-15 ENCOUNTER — Ambulatory Visit (INDEPENDENT_AMBULATORY_CARE_PROVIDER_SITE_OTHER): Admitting: Podiatry

## 2024-02-15 DIAGNOSIS — G5792 Unspecified mononeuropathy of left lower limb: Secondary | ICD-10-CM | POA: Diagnosis not present

## 2024-02-15 NOTE — Progress Notes (Signed)
 She presents today for a second neurolysis injection.  She states that she was disappointed the first 1 did not work but she did receive about 36 hours worth relief.  Objective: Vital signs are stable she is alert and oriented x 3 multiple surgeries and about the 2nd and 3rd metatarsal phalangeal joints have most likely left scarring and neuromas to the second interdigital space left foot.  Assessment: Neuroma second interdigital space left foot.  Plan: Injected her second dose of 4% dehydrated alcohol 1.5 cc was utilized.  I will follow-up with her in 1 week.

## 2024-02-16 DIAGNOSIS — M6281 Muscle weakness (generalized): Secondary | ICD-10-CM | POA: Diagnosis not present

## 2024-02-16 DIAGNOSIS — M25552 Pain in left hip: Secondary | ICD-10-CM | POA: Diagnosis not present

## 2024-02-21 DIAGNOSIS — M25552 Pain in left hip: Secondary | ICD-10-CM | POA: Diagnosis not present

## 2024-02-21 DIAGNOSIS — M6281 Muscle weakness (generalized): Secondary | ICD-10-CM | POA: Diagnosis not present

## 2024-02-22 ENCOUNTER — Ambulatory Visit (INDEPENDENT_AMBULATORY_CARE_PROVIDER_SITE_OTHER): Admitting: Podiatry

## 2024-02-22 DIAGNOSIS — G5760 Lesion of plantar nerve, unspecified lower limb: Secondary | ICD-10-CM

## 2024-02-22 DIAGNOSIS — G5762 Lesion of plantar nerve, left lower limb: Secondary | ICD-10-CM | POA: Diagnosis not present

## 2024-02-22 DIAGNOSIS — M6281 Muscle weakness (generalized): Secondary | ICD-10-CM | POA: Diagnosis not present

## 2024-02-22 DIAGNOSIS — M25552 Pain in left hip: Secondary | ICD-10-CM | POA: Diagnosis not present

## 2024-02-22 NOTE — Progress Notes (Signed)
 She presents today for her third dehydrated alcohol injection to her second interdigital space of her left foot.  She states that she already feels better at nighttime probably by about 50%.  She states that she would like to continue the weekly therapy.  Objective: Vital signs are stable alert oriented x 3 much decrease in symptomatology on palpation to the second interdigital space of her left foot.  Assessment: Resolving neuroma with dehydrated alcohol second interdigital space of her left foot.  Plan: Injected her third dose of dehydrated alcohol 4% 1.5 cc was injected to the second and digital space left foot.  Follow-up with her for her fourth injection next week.

## 2024-02-28 ENCOUNTER — Encounter: Payer: Self-pay | Admitting: Primary Care

## 2024-02-28 DIAGNOSIS — M6281 Muscle weakness (generalized): Secondary | ICD-10-CM | POA: Diagnosis not present

## 2024-02-28 DIAGNOSIS — M25552 Pain in left hip: Secondary | ICD-10-CM | POA: Diagnosis not present

## 2024-02-29 ENCOUNTER — Ambulatory Visit (INDEPENDENT_AMBULATORY_CARE_PROVIDER_SITE_OTHER): Admitting: Podiatry

## 2024-02-29 DIAGNOSIS — G5762 Lesion of plantar nerve, left lower limb: Secondary | ICD-10-CM | POA: Diagnosis not present

## 2024-02-29 DIAGNOSIS — G5760 Lesion of plantar nerve, unspecified lower limb: Secondary | ICD-10-CM

## 2024-02-29 NOTE — Progress Notes (Signed)
 She presents today for follow-up of her neuroma she states that it really has not gotten that much better since last visit.  States that it still feels better than it did prior to the alcohol injections.  Objective: Palpable Mulder's click third interdigital space left foot with tenderness.  Assessment: Neuroma second interdigital space left foot.  Plan: Injected her fourth dose of dehydrated alcohol today to the second interdigital space.  Follow-up with her in 1 week

## 2024-03-07 ENCOUNTER — Ambulatory Visit (INDEPENDENT_AMBULATORY_CARE_PROVIDER_SITE_OTHER): Admitting: Podiatry

## 2024-03-07 DIAGNOSIS — M7062 Trochanteric bursitis, left hip: Secondary | ICD-10-CM | POA: Diagnosis not present

## 2024-03-07 DIAGNOSIS — M7632 Iliotibial band syndrome, left leg: Secondary | ICD-10-CM | POA: Diagnosis not present

## 2024-03-07 DIAGNOSIS — G5760 Lesion of plantar nerve, unspecified lower limb: Secondary | ICD-10-CM

## 2024-03-07 DIAGNOSIS — M25552 Pain in left hip: Secondary | ICD-10-CM | POA: Diagnosis not present

## 2024-03-07 DIAGNOSIS — M6281 Muscle weakness (generalized): Secondary | ICD-10-CM | POA: Diagnosis not present

## 2024-03-07 DIAGNOSIS — S76012S Strain of muscle, fascia and tendon of left hip, sequela: Secondary | ICD-10-CM | POA: Diagnosis not present

## 2024-03-07 NOTE — Progress Notes (Signed)
 She presents today for a follow-up of her neuroma second interdigital space of her left foot.  She states that it is so much better than it was previously she states that I think I could almost stand it at this point.  She states that she would like to continue to treated until it has resolved.  Objective: Vital signs stable alert oriented x 3 she has some tenderness on palpation of the second interdigital space with a significant amount of scar tissue present.  Assessment: Resolving neuroma after her fourth dose of dehydrated alcohol second interspace left foot.  Plan: I injected her fifth dose of dehydrated alcohol today 4%.  We injected 2 cc of this.  Dr. Silva will follow-up with her the next 2 Wednesdays.

## 2024-03-14 ENCOUNTER — Ambulatory Visit: Admitting: Podiatry

## 2024-03-14 VITALS — Ht 66.0 in | Wt 186.0 lb

## 2024-03-14 DIAGNOSIS — L570 Actinic keratosis: Secondary | ICD-10-CM | POA: Diagnosis not present

## 2024-03-14 DIAGNOSIS — L578 Other skin changes due to chronic exposure to nonionizing radiation: Secondary | ICD-10-CM | POA: Diagnosis not present

## 2024-03-14 DIAGNOSIS — L820 Inflamed seborrheic keratosis: Secondary | ICD-10-CM | POA: Diagnosis not present

## 2024-03-14 DIAGNOSIS — G5760 Lesion of plantar nerve, unspecified lower limb: Secondary | ICD-10-CM

## 2024-03-14 DIAGNOSIS — G5762 Lesion of plantar nerve, left lower limb: Secondary | ICD-10-CM | POA: Diagnosis not present

## 2024-03-14 DIAGNOSIS — L814 Other melanin hyperpigmentation: Secondary | ICD-10-CM | POA: Diagnosis not present

## 2024-03-14 DIAGNOSIS — D229 Melanocytic nevi, unspecified: Secondary | ICD-10-CM | POA: Diagnosis not present

## 2024-03-14 NOTE — Progress Notes (Signed)
  Subjective:  Patient ID: Kelsey Weaver, female    DOB: Mar 04, 1946,  MRN: [id number]  Chief Complaint  Patient presents with   Neuroma    Rm 7 Patient is here for a neuroma and would like an injection today.    78 y.o. female presents with the above complaint. History confirmed with patient.   Objective:  Physical Exam: warm, good capillary refill, no trophic changes or ulcerative lesions, normal DP and PT pulses, normal sensory exam, and plantar second interspace tenderness to palpation left foot  Assessment:   1. Neuroma of second interspace of foot      Plan:  Patient was evaluated and treated and all questions answered.  She presents today for fifth sclerosing alcohol injection of the left foot second interspace.  She has tolerated the previous injections well.  She only had minor bruising from her previous injection site.  We discussed the risks and benefits.  Following consent the left secondwas injected with a dorsal approach after prep with alcohol with 2 cc of dehydrated alcohol.  She will return in 1 week for injection #6.  No follow-ups on file.

## 2024-03-15 DIAGNOSIS — G4700 Insomnia, unspecified: Secondary | ICD-10-CM

## 2024-03-16 MED ORDER — TRAZODONE HCL 150 MG PO TABS: 150.0000 mg | ORAL_TABLET | Freq: Every day | ORAL | 1 refills | Status: AC

## 2024-03-21 ENCOUNTER — Ambulatory Visit (INDEPENDENT_AMBULATORY_CARE_PROVIDER_SITE_OTHER): Admitting: Podiatry

## 2024-03-21 VITALS — Ht 66.0 in | Wt 186.0 lb

## 2024-03-21 DIAGNOSIS — G5762 Lesion of plantar nerve, left lower limb: Secondary | ICD-10-CM

## 2024-03-21 DIAGNOSIS — G5760 Lesion of plantar nerve, unspecified lower limb: Secondary | ICD-10-CM

## 2024-03-21 NOTE — Progress Notes (Signed)
  Subjective:  Patient ID: Kelsey Weaver, female    DOB: [birth year],  MRN: [id number]  Chief Complaint  Patient presents with   Neuroma    RM 2 Patient is here to follow up on neuroma of left foot. Pt states moderate pain after prolonged walking in left index toe.    78 y.o. female presents with the above complaint. History confirmed with patient.   Objective:  Physical Exam: warm, good capillary refill, no trophic changes or ulcerative lesions, normal DP and PT pulses, normal sensory exam, and plantar second interspace tenderness to palpation left foot  Assessment:   1. Neuroma of second interspace of foot       Plan:  Patient was evaluated and treated and all questions answered.  She presents today for 6 sclerosing alcohol injection of the left foot second interspace.  She has tolerated the previous injections well.  She only had minor bruising from her previous injection site.  We discussed the risks and benefits.  Following consent the left secondv interspace was injected with a dorsal approach after prep with alcohol with 2 cc of dehydrated alcohol.  She will return in 1 week for injection #7.  Return in about 1 week (around 03/28/2024) for neuroma alcohol injection #8.

## 2024-03-28 ENCOUNTER — Ambulatory Visit (INDEPENDENT_AMBULATORY_CARE_PROVIDER_SITE_OTHER): Admitting: Podiatry

## 2024-03-28 DIAGNOSIS — G5760 Lesion of plantar nerve, unspecified lower limb: Secondary | ICD-10-CM

## 2024-03-28 MED ORDER — TRAMADOL HCL 50 MG PO TABS: 50.0000 mg | ORAL_TABLET | Freq: Three times a day (TID) | ORAL | 0 refills | Status: AC | PRN

## 2024-03-28 NOTE — Progress Notes (Signed)
 She presents today for follow-up of her neuroma injection #8 she states that for the past week or so she has been in excruciating pain unable to sleep and she states that she would like to skip the injection today just because it hurts so badly.  Objective: She still has pain on palpation with considerable ecchymosis around the second interdigital space of the left foot.  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  Plan: We are going to forego the injection today and I will follow-up with her in a couple of weeks at which time we will put our eighth injection in.

## 2024-04-11 ENCOUNTER — Ambulatory Visit: Admitting: Podiatry

## 2024-04-11 DIAGNOSIS — G5760 Lesion of plantar nerve, unspecified lower limb: Secondary | ICD-10-CM

## 2024-04-11 NOTE — Progress Notes (Signed)
 She presents today for a follow-up of her neuroma.  She has had 7 alcohol injections and states that her foot is starting to improve by approximately 50%.  She feels that it may be more in the toe and less in the forefoot at this point.  As she refers to the second digit of her left foot.  Objective: Vital signs stable alert oriented x 3 palpable Mulder's click and pain on palpation to the intermetatarsal space.  She does have some tenderness on direct palpation near the old incision line dorsal aspect of the right foot.  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  Plan: Injected 1.5 cc of 4% dehydrated alcohol and local anesthetic solution into the second interdigital space.  I will follow-up with her in 1 week.

## 2024-04-18 ENCOUNTER — Ambulatory Visit: Admitting: Podiatry

## 2024-04-25 ENCOUNTER — Ambulatory Visit: Admitting: Podiatry

## 2024-04-25 DIAGNOSIS — G5760 Lesion of plantar nerve, unspecified lower limb: Secondary | ICD-10-CM

## 2024-04-25 DIAGNOSIS — G5762 Lesion of plantar nerve, left lower limb: Secondary | ICD-10-CM

## 2024-04-25 NOTE — Progress Notes (Signed)
 She presents today for follow-up of her neurolysis for her neuroma second interdigital space of her left foot.  States that she is much improved states that the way we added at last time at we aimed toward the second toe from the second interdigital space and she said that worked much better.  She is very happy with the outcome.  Objective: Pulses are palpable.  She has tenderness on palpation with a palpable Mulder's click to the interdigital space of her left foot.  Much decrease in pain from previous evaluation.  Assessment: Well-healing neuroma second interspace left foot.  Plan: We injected today with 2 cc of 4% dehydrated alcohol and local anesthetic.

## 2024-04-27 ENCOUNTER — Other Ambulatory Visit: Payer: Self-pay | Admitting: Primary Care

## 2024-04-27 DIAGNOSIS — E1165 Type 2 diabetes mellitus with hyperglycemia: Secondary | ICD-10-CM

## 2024-05-01 ENCOUNTER — Ambulatory Visit: Admitting: Podiatry

## 2024-05-01 ENCOUNTER — Encounter: Payer: Self-pay | Admitting: Podiatry

## 2024-05-01 DIAGNOSIS — G5760 Lesion of plantar nerve, unspecified lower limb: Secondary | ICD-10-CM | POA: Diagnosis not present

## 2024-05-01 NOTE — Progress Notes (Signed)
 She presents today for her 10th dehydrated alcohol injection to the second interdigital space of the right foot.  She states that it is so much better she really does not know if she needs to continue she states that she would like to try at least 1-2 more time just to make sure that is not complete and that it will not come back.  Objective: Vital signs stable alert oriented x 3 hardly any pain on palpation to the second interdigital space.  Assessment: Resolving neuroma.  Plan: Injected 2 cc dehydrated alcohol 4% to the second interdigital space.

## 2024-05-07 ENCOUNTER — Ambulatory Visit: Admitting: Podiatry

## 2024-05-07 DIAGNOSIS — G5762 Lesion of plantar nerve, left lower limb: Secondary | ICD-10-CM | POA: Diagnosis not present

## 2024-05-07 DIAGNOSIS — G5760 Lesion of plantar nerve, unspecified lower limb: Secondary | ICD-10-CM

## 2024-05-07 NOTE — Progress Notes (Signed)
 She presents today for her 11th dehydrated alcohol injection to the second interdigital space of the right foot.  She states that it is so much better she really does not know if she needs to continue she states that she would like to try at least 1-2 more time just to make sure that is not complete and that it will not come back.  Objective: Vital signs stable alert oriented x 3 hardly any pain on palpation to the second interdigital space.  Assessment: Resolving neuroma.  Plan: Injected 2 cc dehydrated alcohol 4% to the second interdigital space.

## 2024-05-16 ENCOUNTER — Ambulatory Visit: Admitting: Podiatry

## 2024-05-16 DIAGNOSIS — G5761 Lesion of plantar nerve, right lower limb: Secondary | ICD-10-CM

## 2024-05-16 DIAGNOSIS — G5760 Lesion of plantar nerve, unspecified lower limb: Secondary | ICD-10-CM

## 2024-05-16 NOTE — Progress Notes (Signed)
 She presents today for her 11th dehydrated alcohol injection to the second interdigital space of the right foot.  She states that it is so much better she really does not know if she needs to continue she states that she would like to try at least 1-2 more time just to make sure that is not complete and that it will not come back.  Objective: Vital signs stable alert oriented x 3 hardly any pain on palpation to the second interdigital space.  Assessment: Resolving neuroma.  Plan: Injected 2 cc dehydrated alcohol 4% to the second interdigital space.

## 2024-05-23 ENCOUNTER — Other Ambulatory Visit: Payer: Self-pay | Admitting: Primary Care

## 2024-05-23 ENCOUNTER — Ambulatory Visit: Admitting: Podiatry

## 2024-05-23 DIAGNOSIS — Z1231 Encounter for screening mammogram for malignant neoplasm of breast: Secondary | ICD-10-CM

## 2024-05-24 ENCOUNTER — Ambulatory Visit: Payer: Medicare Other

## 2024-05-30 ENCOUNTER — Ambulatory Visit: Admitting: Podiatry

## 2024-06-01 ENCOUNTER — Ambulatory Visit

## 2024-06-08 NOTE — Telephone Encounter (Unsigned)
 Copied from CRM #[id number]. Topic: Clinical - Medication Question >> Jun 08, 2024 11:08 AM Pinkey ORN wrote: Reason for CRM: Medication Request >> Jun 08, 2024 11:10 AM Pinkey ORN wrote: Patient is requesting to be prescribed Tamiflu. Patient states she used an at-home test and it came back negative for the flu, but she's experiencing symptoms such as sore throat, headaches and a cough. There's no mention of a fever.

## 2024-06-13 ENCOUNTER — Ambulatory Visit (INDEPENDENT_AMBULATORY_CARE_PROVIDER_SITE_OTHER): Admitting: Podiatry

## 2024-06-13 ENCOUNTER — Encounter: Payer: Self-pay | Admitting: Podiatry

## 2024-06-13 DIAGNOSIS — G5762 Lesion of plantar nerve, left lower limb: Secondary | ICD-10-CM

## 2024-06-13 DIAGNOSIS — G5760 Lesion of plantar nerve, unspecified lower limb: Secondary | ICD-10-CM

## 2024-06-13 NOTE — Progress Notes (Signed)
 Kelsey Weaver presents today for follow-up of her neuroma to the second interdigital space of her left foot.  She states that it really has not been improving over the past few attempts and is becoming somewhat frustrating.  Objective: She still has pain on palpation to the dorsal lateral aspect of the second digit left foot most likely due to scar entrapment.  Positive Mulder's click second interdigital space left foot.  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  Plan: Discussed the possible necessity of breaking up the scar tissue surgically as well as removing the deep plantar portion of the neuroma.  Also discussed reinjecting the area that we were injecting that was making the most difference prior to her asking me to inject the toe directly.  So we went back to injecting 2.0 cc of 4% dehydrated alcohol and local anesthetic into the second interdigital space left foot.  This was her 12th injection.  I will follow-up with her in another week or 2.

## 2024-06-14 MED ORDER — TRAMADOL HCL 50 MG PO TABS: 50.0000 mg | ORAL_TABLET | Freq: Three times a day (TID) | ORAL | 0 refills | Status: AC | PRN

## 2024-06-20 ENCOUNTER — Ambulatory Visit: Admitting: Podiatry

## 2024-06-26 ENCOUNTER — Encounter

## 2024-07-10 ENCOUNTER — Ambulatory Visit: Admitting: Primary Care

## 2024-07-24 ENCOUNTER — Ambulatory Visit: Admitting: Cardiovascular Disease

## 2024-07-30 ENCOUNTER — Ambulatory Visit
